# Patient Record
Sex: Male | Born: 1960 | Race: White | Hispanic: No | Marital: Married | Smoking: Former smoker
Health system: Southern US, Community
[De-identification: ages and names within clinical notes are randomized; demographics above are authoritative.]

## PROBLEM LIST (undated history)

## (undated) DIAGNOSIS — M1990 Unspecified osteoarthritis, unspecified site: Secondary | ICD-10-CM

## (undated) DIAGNOSIS — E785 Hyperlipidemia, unspecified: Secondary | ICD-10-CM

## (undated) DIAGNOSIS — Z87438 Personal history of other diseases of male genital organs: Secondary | ICD-10-CM

## (undated) DIAGNOSIS — I10 Essential (primary) hypertension: Secondary | ICD-10-CM

## (undated) DIAGNOSIS — E119 Type 2 diabetes mellitus without complications: Secondary | ICD-10-CM

## (undated) DIAGNOSIS — N529 Male erectile dysfunction, unspecified: Secondary | ICD-10-CM

## (undated) DIAGNOSIS — R972 Elevated prostate specific antigen [PSA]: Secondary | ICD-10-CM

## (undated) DIAGNOSIS — J189 Pneumonia, unspecified organism: Secondary | ICD-10-CM

## (undated) DIAGNOSIS — I509 Heart failure, unspecified: Secondary | ICD-10-CM

## (undated) DIAGNOSIS — F419 Anxiety disorder, unspecified: Secondary | ICD-10-CM

## (undated) DIAGNOSIS — C801 Malignant (primary) neoplasm, unspecified: Secondary | ICD-10-CM

## (undated) DIAGNOSIS — K219 Gastro-esophageal reflux disease without esophagitis: Secondary | ICD-10-CM

## (undated) DIAGNOSIS — R12 Heartburn: Secondary | ICD-10-CM

## (undated) DIAGNOSIS — C169 Malignant neoplasm of stomach, unspecified: Secondary | ICD-10-CM

## (undated) DIAGNOSIS — I639 Cerebral infarction, unspecified: Secondary | ICD-10-CM

## (undated) DIAGNOSIS — U071 COVID-19: Secondary | ICD-10-CM

## (undated) HISTORY — DX: Essential (primary) hypertension: I10

## (undated) HISTORY — DX: Cerebral infarction, unspecified: I63.9

## (undated) HISTORY — DX: Hyperlipidemia, unspecified: E78.5

## (undated) HISTORY — DX: Malignant neoplasm of stomach, unspecified: C16.9

## (undated) HISTORY — DX: Male erectile dysfunction, unspecified: N52.9

## (undated) HISTORY — DX: Heartburn: R12

## (undated) HISTORY — DX: COVID-19: U07.1

## (undated) HISTORY — DX: Anxiety disorder, unspecified: F41.9

## (undated) HISTORY — PX: MULTIPLE TOOTH EXTRACTIONS: SHX2053

## (undated) HISTORY — DX: Personal history of other diseases of male genital organs: Z87.438

## (undated) HISTORY — PX: TONSILLECTOMY: SUR1361

## (undated) HISTORY — DX: Pneumonia, unspecified organism: J18.9

## (undated) HISTORY — DX: Elevated prostate specific antigen (PSA): R97.20

---

## 1995-11-02 HISTORY — PX: CERVICAL SPINE SURGERY: SHX589

## 2003-11-02 HISTORY — PX: APPENDECTOMY: SHX54

## 2003-11-02 HISTORY — PX: CHOLECYSTECTOMY: SHX55

## 2004-02-10 ENCOUNTER — Encounter: Admission: RE | Admit: 2004-02-10 | Discharge: 2004-02-10 | Payer: Self-pay

## 2004-03-12 ENCOUNTER — Other Ambulatory Visit: Payer: Self-pay

## 2004-04-25 ENCOUNTER — Other Ambulatory Visit: Payer: Self-pay

## 2004-04-26 ENCOUNTER — Other Ambulatory Visit: Payer: Self-pay

## 2004-04-27 ENCOUNTER — Other Ambulatory Visit: Payer: Self-pay

## 2005-02-27 ENCOUNTER — Ambulatory Visit (HOSPITAL_COMMUNITY): Admission: RE | Admit: 2005-02-27 | Discharge: 2005-02-27 | Payer: Self-pay | Admitting: Orthopedic Surgery

## 2005-08-11 ENCOUNTER — Other Ambulatory Visit: Payer: Self-pay

## 2005-08-12 ENCOUNTER — Inpatient Hospital Stay: Payer: Self-pay | Admitting: Infectious Diseases

## 2006-02-05 ENCOUNTER — Emergency Department: Payer: Self-pay | Admitting: Emergency Medicine

## 2006-04-24 ENCOUNTER — Emergency Department: Payer: Self-pay | Admitting: General Practice

## 2006-05-18 ENCOUNTER — Emergency Department (HOSPITAL_COMMUNITY): Admission: EM | Admit: 2006-05-18 | Discharge: 2006-05-18 | Payer: Self-pay | Admitting: Emergency Medicine

## 2006-05-21 ENCOUNTER — Emergency Department (HOSPITAL_COMMUNITY): Admission: EM | Admit: 2006-05-21 | Discharge: 2006-05-21 | Payer: Self-pay | Admitting: Emergency Medicine

## 2007-01-31 ENCOUNTER — Emergency Department: Payer: Self-pay | Admitting: Emergency Medicine

## 2007-02-01 ENCOUNTER — Ambulatory Visit: Payer: Self-pay | Admitting: Emergency Medicine

## 2007-02-07 ENCOUNTER — Ambulatory Visit: Payer: Self-pay | Admitting: Family Medicine

## 2007-03-08 ENCOUNTER — Other Ambulatory Visit: Payer: Self-pay

## 2007-03-08 ENCOUNTER — Inpatient Hospital Stay: Payer: Self-pay | Admitting: Internal Medicine

## 2007-03-29 ENCOUNTER — Ambulatory Visit: Payer: Self-pay | Admitting: Gastroenterology

## 2007-05-22 ENCOUNTER — Emergency Department: Payer: Self-pay | Admitting: Emergency Medicine

## 2007-06-15 ENCOUNTER — Ambulatory Visit: Payer: Self-pay | Admitting: Gastroenterology

## 2007-06-20 ENCOUNTER — Ambulatory Visit: Payer: Self-pay | Admitting: Gastroenterology

## 2007-06-22 ENCOUNTER — Emergency Department: Payer: Self-pay | Admitting: Emergency Medicine

## 2008-09-16 ENCOUNTER — Emergency Department: Payer: Self-pay | Admitting: Emergency Medicine

## 2014-04-20 ENCOUNTER — Emergency Department: Payer: Self-pay | Admitting: Emergency Medicine

## 2014-04-20 LAB — CBC WITH DIFFERENTIAL/PLATELET
Basophil #: 0 10*3/uL (ref 0.0–0.1)
Basophil %: 0.4 %
Eosinophil #: 0.1 10*3/uL (ref 0.0–0.7)
Eosinophil %: 0.6 %
HCT: 43.8 % (ref 40.0–52.0)
HGB: 14.7 g/dL (ref 13.0–18.0)
Lymphocyte #: 1.8 10*3/uL (ref 1.0–3.6)
Lymphocyte %: 20.5 %
MCH: 29.3 pg (ref 26.0–34.0)
MCHC: 33.6 g/dL (ref 32.0–36.0)
MCV: 87 fL (ref 80–100)
Monocyte #: 0.6 x10 3/mm (ref 0.2–1.0)
Monocyte %: 6.6 %
Neutrophil #: 6.5 10*3/uL (ref 1.4–6.5)
Neutrophil %: 71.9 %
Platelet: 142 10*3/uL — ABNORMAL LOW (ref 150–440)
RBC: 5.02 10*6/uL (ref 4.40–5.90)
RDW: 13.6 % (ref 11.5–14.5)
WBC: 9 10*3/uL (ref 3.8–10.6)

## 2014-04-20 LAB — BASIC METABOLIC PANEL
Anion Gap: 4 — ABNORMAL LOW (ref 7–16)
BUN: 12 mg/dL (ref 7–18)
Calcium, Total: 8.9 mg/dL (ref 8.5–10.1)
Chloride: 100 mmol/L (ref 98–107)
Co2: 30 mmol/L (ref 21–32)
Creatinine: 0.98 mg/dL (ref 0.60–1.30)
EGFR (African American): >60
EGFR (Non-African Amer.): >60
Glucose: 121 mg/dL — ABNORMAL HIGH (ref 65–99)
Osmolality: 269 (ref 275–301)
Potassium: 4 mmol/L (ref 3.5–5.1)
Sodium: 134 mmol/L — ABNORMAL LOW (ref 136–145)

## 2016-08-26 ENCOUNTER — Encounter: Payer: Self-pay | Admitting: Urology

## 2016-08-26 ENCOUNTER — Ambulatory Visit: Payer: BLUE CROSS/BLUE SHIELD | Admitting: Urology

## 2016-08-26 VITALS — BP 198/127 | HR 75 | Ht 70.0 in | Wt 191.7 lb

## 2016-08-26 DIAGNOSIS — N529 Male erectile dysfunction, unspecified: Secondary | ICD-10-CM | POA: Diagnosis not present

## 2016-08-26 DIAGNOSIS — N138 Other obstructive and reflux uropathy: Secondary | ICD-10-CM

## 2016-08-26 DIAGNOSIS — N401 Enlarged prostate with lower urinary tract symptoms: Secondary | ICD-10-CM

## 2016-08-26 DIAGNOSIS — R972 Elevated prostate specific antigen [PSA]: Secondary | ICD-10-CM | POA: Diagnosis not present

## 2016-08-26 LAB — BLADDER SCAN AMB NON-IMAGING: Scan Result: 44

## 2016-08-26 NOTE — Progress Notes (Signed)
08/26/2016 4:13 PM   Kevin Esparza August 01, 1961 [id number]  Referring provider: Lum Babe, MD [place], [place] [address]  Chief Complaint  Patient presents with  . New Patient (Initial Visit)    elevated psa referred by [facility] in [place]    HPI: Patient is 55 year old Caucasian male who presents today as a referral from Robyne Askew, NP for elevated PSA of 4.7.  His IPSS score today is 19, which is moderate lower urinary tract symptomatology.  He is mostly dissatisfied with his quality life due to his urinary symptoms.  His PVR is 44 mL.     His major complaints today are urinary frequency, urgency, nocturia, urge incontinence, postvoid dribbling and intermittency.  Patient is an over the road truck driver.  He states he drinks 12-15 cans of [facility] on a daily basis.  He has had these symptoms for the last few years.  He denies any dysuria, hematuria or suprapubic pain.   He also denies any recent fevers, chills, nausea or vomiting.  He does not have a family history of PCa.      IPSS    Row Name 08/26/16 1500         International Prostate Symptom Score   How often have you had the sensation of not emptying your bladder? About half the time     How often have you had to urinate less than every two hours? Almost always     How often have you found you stopped and started again several times when you urinated? Less than half the time     How often have you found it difficult to postpone urination? More than half the time     How often have you had a weak urinary stream? Less than half the time     How often have you had to strain to start urination? Less than half the time     How many times did you typically get up at night to urinate? 1 Time     Total IPSS Score 19       Quality of Life due to urinary symptoms   If you were to spend the rest of your life with your urinary condition just the way it is now how would you  feel about that? Mostly Disatisfied        Score:  1-7 Mild 8-19 Moderate 20-35 Severe  Erectile dysfunction His SHIM score is 22, which is no erectile dysfunction.   He states that he filled out the questionnaire based on the last time he had a satisfactory sexual experience.   He admits that he is been having difficulty with erections for several years.   His major complaint is lack of firmness.  His libido is preserved.   His risk factors for ED are age, BPH, HTN, anxiety and blood pressure medications.   He denies any painful erections or curvatures with his erections.        SHIM    Row Name 08/26/16 1549         SHIM: Over the last 6 months:   How do you rate your confidence that you could get and keep an erection? Moderate     When you had erections with sexual stimulation, how often were your erections hard enough for penetration (entering your partner)? Almost Always or Always     During sexual intercourse, how often were you able to maintain your erection  after you had penetrated (entered) your partner? Not Difficult     During sexual intercourse, how difficult was it to maintain your erection to completion of intercourse? Not Difficult     When you attempted sexual intercourse, how often was it satisfactory for you? Slightly Difficult       SHIM Total Score   SHIM 22        Score: 1-7 Severe ED 8-11 Moderate ED 12-16 Mild-Moderate ED 17-21 Mild ED 22-25 No ED  PMH: Past Medical History:  Diagnosis Date  . Anxiety   . Elevated PSA   . Heartburn   . HLD (hyperlipidemia)   . HTN (hypertension)     Surgical History: Past Surgical History:  Procedure Laterality Date  . CERVICAL SPINE SURGERY  1997   c4-c7  . CHOLECYSTECTOMY  2005    Home Medications:    Medication List       Accurate as of 08/26/16  4:13 PM. Always use your most recent med list.          aspirin 81 MG chewable tablet Chew by mouth daily.   enalapril 5 MG tablet Commonly known  as:  VASOTEC Take 5 mg by mouth daily.   FISH OIL PO Take by mouth.   metoprolol succinate 50 MG 24 hr tablet Commonly known as:  TOPROL-XL Take 50 mg by mouth daily. Take with or immediately following a meal.   NIACIN PO Take by mouth.       Allergies: No Known Allergies  Family History: Family History  Problem Relation Age of Onset  . Kidney cancer Maternal Grandmother   . Kidney disease Neg Hx   . Prostate cancer Neg Hx     Social History:  reports that he quit smoking about 12 years ago. His smokeless tobacco use includes Chew. He reports that he drinks alcohol. He reports that he does not use drugs.  ROS: UROLOGY Frequent Urination?: Yes Hard to postpone urination?: Yes Burning/pain with urination?: No Get up at night to urinate?: Yes Leakage of urine?: Yes Urine stream starts and stops?: Yes Trouble starting stream?: No Do you have to strain to urinate?: No Blood in urine?: No Urinary tract infection?: No Sexually transmitted disease?: No Injury to kidneys or bladder?: No Painful intercourse?: No Weak stream?: No Erection problems?: Yes Penile pain?: No  Gastrointestinal Nausea?: No Vomiting?: No Indigestion/heartburn?: Yes Diarrhea?: Yes Constipation?: No  Constitutional Fever: No Night sweats?: No Weight loss?: No Fatigue?: Yes  Skin Skin rash/lesions?: No Itching?: No  Eyes Blurred vision?: No Double vision?: No  Ears/Nose/Throat Sore throat?: No Sinus problems?: Yes  Hematologic/Lymphatic Swollen glands?: No Easy bruising?: No  Cardiovascular Leg swelling?: No Chest pain?: No  Respiratory Cough?: No Shortness of breath?: No  Endocrine Excessive thirst?: Yes  Musculoskeletal Back pain?: Yes Joint pain?: Yes  Neurological Headaches?: No Dizziness?: Yes  Psychologic Depression?: No Anxiety?: Yes  Physical Exam: BP (!) 198/127   Pulse 75   Ht 5\' 10"  (1.778 m)   Wt 191 lb 11.2 oz (87 kg)   BMI 27.51 kg/m     Constitutional: Well nourished. Alert and oriented, No acute distress. HEENT: [place] AT, moist mucus membranes. Trachea midline, no masses. Cardiovascular: No clubbing, cyanosis, or edema. Respiratory: Normal respiratory effort, no increased work of breathing. GI: Abdomen is soft, non tender, non distended, no abdominal masses. Liver and spleen not palpable.  No hernias appreciated.  Stool sample for occult testing is not indicated.   GU: No CVA  tenderness.  No bladder fullness or masses.  Patient with circumcised phallus.  Urethral meatus is patent.  No penile discharge. No penile lesions or rashes. Scrotum without lesions, cysts, rashes and/or edema.  Testicles are located scrotally bilaterally. No masses are appreciated in the testicles. Left and right epididymis are normal. Rectal: Patient with  normal sphincter tone. Anus and perineum without scarring or rashes. No rectal masses are appreciated. Prostate is approximately 45 grams, no nodules are appreciated. Seminal vesicles are normal. Skin: No rashes, bruises or suspicious lesions. Lymph: No cervical or inguinal adenopathy. Neurologic: Grossly intact, no focal deficits, moving all 4 extremities. Psychiatric: Normal mood and affect.  Laboratory Data: Lab Results  Component Value Date   WBC 9.0 04/20/2014   HGB 14.7 04/20/2014   HCT 43.8 04/20/2014   MCV 87 04/20/2014   PLT 142 (L) 04/20/2014    Lab Results  Component Value Date   CREATININE 0.98 04/20/2014   PSA History  1.0 ng/mL on 01/16/2013   0.37  Free PSA    37%     1.3 ng/mL on 06/16/2014   0.5  Free PSA  38%   4.7 ng/mL on 08/05/2016   1.6  Free PSA  34%  Pertinent Imaging: Results for Kevin, Esparza (MRN [id number]) as of 09/03/2016 13:44  Ref. Range 08/26/2016 15:51  Scan Result Unknown 44    Assessment & Plan:    1. Elevated PSA  - I discussed with the patient that PSA is an acronym for  prostate specific antigen,  which is a protein made by the prostate  gland and can be detected in the blood stream. I explained to the patient situations that would increase the PSA, such as: a man's age,  BPH, infection, recent intercourse/ejaculation, prostate infarction, recent urethroscopic manipulation (Foley placement/cystoscopy) and prostate cancer.   - At this time, I have advised the patient that we will repeat the PSA to rule out lab error.  If that should return elevated, we could continue observation or pursue a prostate biopsy.   - We discussed that indications for prostate biopsy are defined by age and race specific PSA cutoffs as well as a PSA velocity of 0.75/year- his PSA has increased at a high velocity over the last two years    - PSA  2. BPH with LUTS  - IPSS score is 19/4  - Continue conservative management, avoiding bladder irritants and timed voiding's- advised patient to eliminate [facility] from his diet and consume water  - RTC pending PSA results  3. Erectile dysfunction  - SHIM score is 22  - Patient did not admit to me that he suffered from erectile dysfunction and tell we were finished with his office visit and in the hallway  - I stated to the patient that we can address this further at a later date pending his PSA results  Return for Pending PSA results.  These notes generated with voice recognition software. I apologize for typographical errors.  Zara Council, [facility] [address], [place], [place] [address] [phone]

## 2016-08-27 ENCOUNTER — Telehealth: Payer: Self-pay

## 2016-08-27 DIAGNOSIS — R972 Elevated prostate specific antigen [PSA]: Secondary | ICD-10-CM

## 2016-08-27 LAB — PSA: Prostate Specific Ag, Serum: 3.3 ng/mL (ref 0.0–4.0)

## 2016-08-27 NOTE — Telephone Encounter (Signed)
-----   Message from [name], PA-C sent at 08/27/2016  8:16 AM EDT ----- Please notify the patient that his PSA has come down.  I would like to make sure the downward trend continues, so if he could come back in 4 months that would be great.

## 2016-08-27 NOTE — Telephone Encounter (Signed)
Spoke with pt in reference to PSA results. Pt voiced understanding. Lab appt made and orders placed.  

## 2016-12-28 ENCOUNTER — Other Ambulatory Visit: Payer: BLUE CROSS/BLUE SHIELD

## 2016-12-28 ENCOUNTER — Other Ambulatory Visit: Payer: Self-pay

## 2016-12-28 DIAGNOSIS — R972 Elevated prostate specific antigen [PSA]: Secondary | ICD-10-CM

## 2016-12-29 ENCOUNTER — Telehealth: Payer: Self-pay

## 2016-12-29 DIAGNOSIS — R972 Elevated prostate specific antigen [PSA]: Secondary | ICD-10-CM

## 2016-12-29 LAB — PSA: Prostate Specific Ag, Serum: 2.8 ng/mL (ref 0.0–4.0)

## 2016-12-29 NOTE — Telephone Encounter (Signed)
Spoke with pt in reference to PSA results. Made pt aware will need an OV in August with labs prior. Pt voiced understanding. Pt was transferred to the front to make f/u appt. Orders placed.

## 2016-12-29 NOTE — Telephone Encounter (Signed)
-----   Message from [name], PA-C sent at 12/29/2016  8:14 AM EST ----- Patient's PSA continues to trend downward.  I would like to see him in August for PSA, I PSS and exam.

## 2017-06-28 ENCOUNTER — Other Ambulatory Visit: Payer: BLUE CROSS/BLUE SHIELD

## 2017-06-30 ENCOUNTER — Ambulatory Visit: Payer: BLUE CROSS/BLUE SHIELD | Admitting: Urology

## 2017-07-29 ENCOUNTER — Other Ambulatory Visit: Payer: Self-pay

## 2017-07-29 DIAGNOSIS — R972 Elevated prostate specific antigen [PSA]: Secondary | ICD-10-CM

## 2017-07-29 NOTE — Progress Notes (Signed)
08/01/2017 10:07 AM   [name] 1961-04-18 [id number]  Referring provider: No referring provider defined for this encounter.  Chief Complaint  Patient presents with  . Elevated PSA    1 year follow up  . Benign Prostatic Hypertrophy  . Erectile Dysfunction    HPI: Patient is 56 year old Caucasian male with a history of elevated PSA and BPH with LU TS who presents today for a 6 months follow up.  History of elevated PSA Patient was referred to [place] from Robyne Askew, NP for elevated PSA of 4.7.  Repeated PSA on 08/26/2016 was 3.3.  His current PSA returned at 1.9 on 07/29/2017.    BPH with LUTS His IPSS score today is 9, which is moderate lower urinary tract symptomatology.  He is pleased with his quality life due to his urinary symptoms.   His previous I PSS score was 19/4. His previous PVR was 44 mL.  His major complaints today are urinary frequency, urgency, nocturia, urge incontinence, postvoid dribbling and intermittency.  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  He states he drinks 12-15 cans of [facility] on a daily basis, but he has added water to his diet.  He has had these symptoms for the last few years.  He denies any dysuria, hematuria or suprapubic pain.  He also denies any recent fevers, chills, nausea or vomiting.  He does not have a family history of PCa.      IPSS    Row Name 08/01/17 0900         International Prostate Symptom Score   How often have you had the sensation of not emptying your bladder? Less than half the time     How often have you had to urinate less than every two hours? Less than 1 in 5 times     How often have you found you stopped and started again several times when you urinated? Less than 1 in 5 times     How often have you found it difficult to postpone urination? Less than half the time     How often have you had a weak urinary stream? Less than half the time     How often have you had to strain  to start urination? Not at All     How many times did you typically get up at night to urinate? 1 Time     Total IPSS Score 9       Quality of Life due to urinary symptoms   If you were to spend the rest of your life with your urinary condition just the way it is now how would you feel about that? Pleased  1        Score:  1-7 Mild 8-19 Moderate 20-35 Severe  Erectile dysfunction His SHIM score is 18, which is mild erectile dysfunction.   His previous SHIM score was 22.  He admits that he is been having difficulty with erections for several years.   His major complaint is lack of firmness.  His libido is preserved.   His risk factors for ED are age, BPH, HTN, anxiety and blood pressure medications.   He denies any painful erections or curvatures with his erections.        SHIM    Row Name 08/01/17 0950         SHIM: Over the last 6 months:   How do you rate your confidence  that you could get and keep an erection? Low     When you had erections with sexual stimulation, how often were your erections hard enough for penetration (entering your partner)? Most Times (much more than half the time)     During sexual intercourse, how often were you able to maintain your erection after you had penetrated (entered) your partner? Most Times (much more than half the time)     During sexual intercourse, how difficult was it to maintain your erection to completion of intercourse? Slightly Difficult     When you attempted sexual intercourse, how often was it satisfactory for you? Most Times (much more than half the time)       SHIM Total Score   SHIM 18        Score: 1-7 Severe ED 8-11 Moderate ED 12-16 Mild-Moderate ED 17-21 Mild ED 22-25 No ED  PMH: Past Medical History:  Diagnosis Date  . Anxiety   . ED (erectile dysfunction)   . Elevated PSA   . Heartburn   . History of BPH   . HLD (hyperlipidemia)   . HTN (hypertension)     Surgical History: Past Surgical History:    Procedure Laterality Date  . CERVICAL SPINE SURGERY  1997   c4-c7  . CHOLECYSTECTOMY  2005  . TONSILLECTOMY      Home Medications:  Allergies as of 08/01/2017   No Known Allergies     Medication List       Accurate as of 08/01/17 10:07 AM. Always use your most recent med list.          amLODipine 10 MG tablet Commonly known as:  NORVASC Take 10 mg by mouth daily.   aspirin 81 MG chewable tablet Chew by mouth daily.   enalapril 5 MG tablet Commonly known as:  VASOTEC Take 5 mg by mouth daily.   FISH OIL PO Take by mouth.   metoprolol succinate 50 MG 24 hr tablet Commonly known as:  TOPROL-XL Take 50 mg by mouth daily. Take with or immediately following a meal.   NIACIN PO Take by mouth.   sildenafil 20 MG tablet Commonly known as:  REVATIO Take 3 to 5 tablets two hours before intercouse on an empty stomach.  Do not take with nitrates.            Discharge Care Instructions        Start     Ordered   08/01/17 0000  Testosterone     08/01/17 1004   08/01/17 0000  TSH     08/01/17 1004   08/01/17 0000  sildenafil (REVATIO) 20 MG tablet    Question:  Supervising Provider  Answer:  Hollice Espy   08/01/17 1005      Allergies: No Known Allergies  Family History: Family History  Problem Relation Age of Onset  . Kidney cancer Maternal Grandmother   . Kidney disease Neg Hx   . Prostate cancer Neg Hx   . Bladder Cancer Neg Hx     Social History:  reports that he quit smoking about 13 years ago. His smokeless tobacco use includes Chew. He reports that he drinks alcohol. He reports that he does not use drugs.  ROS: UROLOGY Frequent Urination?: No Hard to postpone urination?: No Burning/pain with urination?: No Get up at night to urinate?: No Leakage of urine?: No Urine stream starts and stops?: No Trouble starting stream?: No Do you have to strain to urinate?: No Blood in urine?:  No Urinary tract infection?: No Sexually transmitted  disease?: No Injury to kidneys or bladder?: No Painful intercourse?: No Weak stream?: No Erection problems?: No Penile pain?: No  Gastrointestinal Nausea?: No Vomiting?: No Indigestion/heartburn?: No Diarrhea?: No Constipation?: No  Constitutional Fever: No Night sweats?: No Weight loss?: No Fatigue?: No  Skin Skin rash/lesions?: No Itching?: No  Eyes Blurred vision?: No Double vision?: No  Ears/Nose/Throat Sore throat?: No Sinus problems?: No  Hematologic/Lymphatic Swollen glands?: No Easy bruising?: No  Cardiovascular Leg swelling?: No Chest pain?: No  Respiratory Cough?: No Shortness of breath?: No  Endocrine Excessive thirst?: No  Musculoskeletal Back pain?: No Joint pain?: No  Neurological Headaches?: No Dizziness?: No  Psychologic Depression?: No Anxiety?: No  Physical Exam: BP (!) 165/80   Pulse 73   Ht 5\' 10"  (1.778 m)   Wt 182 lb 9.6 oz (82.8 kg)   BMI 26.20 kg/m   Constitutional: Well nourished. Alert and oriented, No acute distress. HEENT: [place] AT, moist mucus membranes. Trachea midline, no masses. Cardiovascular: No clubbing, cyanosis, or edema. Respiratory: Normal respiratory effort, no increased work of breathing. GI: Abdomen is soft, non tender, non distended, no abdominal masses. Liver and spleen not palpable.  No hernias appreciated.  Stool sample for occult testing is not indicated.   GU: No CVA tenderness.  No bladder fullness or masses.  Patient with circumcised phallus.  Urethral meatus is patent.  No penile discharge. No penile lesions or rashes. Scrotum without lesions, cysts, rashes and/or edema.  Testicles are located scrotally bilaterally. No masses are appreciated in the testicles. Left and right epididymis are normal. Rectal: Patient with  normal sphincter tone. Anus and perineum without scarring or rashes. No rectal masses are appreciated. Prostate is approximately 45 grams, no nodules are appreciated. Seminal  vesicles are normal. Skin: No rashes, bruises or suspicious lesions. Lymph: No cervical or inguinal adenopathy. Neurologic: Grossly intact, no focal deficits, moving all 4 extremities. Psychiatric: Normal mood and affect.  Laboratory Data: PSA History  1.0 ng/mL on 01/16/2013   0.37  Free PSA    37%     1.3 ng/mL on 06/16/2014   0.5  Free PSA  38%   4.7 ng/mL on 08/05/2016   1.6  Free PSA  34%   3.3 ng/mL on 08/26/2016  2.8 ng/mL on 12/28/2016  I have reviewed the labs.  Assessment & Plan:    1. History of elevated PSA  - most recent PSA was 1.9  - RTC in 6 months for PSA  2. BPH with LUTS  - IPSS score is 9/1, it is improving  - Continue conservative management, avoiding bladder irritants and timed voiding's- advised patient to eliminate [facility] from his diet and consume water  - RTC 12 months for I PSS, PSA and exam  3. Erectile dysfunction  - SHIM score is 18, it is worsening  - I explained to the patient that in order to achieve an erection it takes good functioning of the nervous system (parasympathetic and rs, sympathetic, sensory and motor), good blood flow into the erectile tissue of the penis and a desire to have sex  - I explained that conditions like diabetes, hypertension, coronary artery disease, peripheral vascular disease, smoking, alcohol consumption, age, sleep apnea and BPH can diminish the ability to have an erection  - we will obtain a serum testosterone level and TSH at this time; if it is abnormal we will need to repeat the study for confirmation  - A recent  study published in Sex Med 2018 Apr 13 revealed moderate to vigorous aerobic exercise for 40 minutes 4 times per week can decrease erectile problems caused by physical inactivity, obesity, hypertension, metabolic syndrome and/or cardiovascular diseases  - We discussed trying a PDE5 inhibitor - Sildenafil 20 mg, 3 to 5 tablets two hours prior to intercourse on an empty stomach, # 35; he is warned  not to take medications that contain nitrates.  I also advised him of the side effects, such as: headache, flushing, dyspepsia, abnormal vision, nasal congestion, back pain, myalgia, nausea, dizziness, and rash.  - RTC in one month for SHIM score  Return in about 1 month (around 09/01/2017) for SHIM score.  These notes generated with voice recognition software. I apologize for typographical errors.  Zara Council, [facility] [address] [place], [place] [address] [phone]

## 2017-07-30 LAB — PSA: Prostate Specific Ag, Serum: 1.9 ng/mL (ref 0.0–4.0)

## 2017-08-01 ENCOUNTER — Encounter: Payer: Self-pay | Admitting: Urology

## 2017-08-01 ENCOUNTER — Ambulatory Visit (INDEPENDENT_AMBULATORY_CARE_PROVIDER_SITE_OTHER): Payer: 59 | Admitting: Urology

## 2017-08-01 VITALS — BP 165/80 | HR 73 | Ht 70.0 in | Wt 182.6 lb

## 2017-08-01 DIAGNOSIS — N138 Other obstructive and reflux uropathy: Secondary | ICD-10-CM | POA: Diagnosis not present

## 2017-08-01 DIAGNOSIS — N401 Enlarged prostate with lower urinary tract symptoms: Secondary | ICD-10-CM | POA: Diagnosis not present

## 2017-08-01 DIAGNOSIS — Z87898 Personal history of other specified conditions: Secondary | ICD-10-CM | POA: Diagnosis not present

## 2017-08-01 DIAGNOSIS — N529 Male erectile dysfunction, unspecified: Secondary | ICD-10-CM

## 2017-08-01 MED ORDER — SILDENAFIL CITRATE 20 MG PO TABS: ORAL_TABLET | ORAL | 3 refills | Status: DC

## 2017-08-03 ENCOUNTER — Telehealth: Payer: Self-pay

## 2017-08-03 LAB — TSH: TSH: 2.16 u[IU]/mL (ref 0.450–4.500)

## 2017-08-03 LAB — TESTOSTERONE: Testosterone: 364 ng/dL (ref 264–916)

## 2017-08-03 NOTE — Telephone Encounter (Signed)
Called patient. No answer. No DPR on file.

## 2017-08-03 NOTE — Telephone Encounter (Signed)
-----   Message from [name], PA-C sent at 08/03/2017  8:25 AM EDT ----- Please let Kevin Esparza known that his testosterone and thyroid blood work are normal.

## 2017-08-03 NOTE — Telephone Encounter (Signed)
-----   Message from [name], PA-C sent at 08/03/2017  8:25 AM EDT ----- Please let Mr. Boulet known that his testosterone and thyroid blood work are normal.

## 2017-08-03 NOTE — Telephone Encounter (Signed)
Spoke with pt in reference to lab results. Pt voiced understanding.  

## 2017-08-04 NOTE — Telephone Encounter (Signed)
Spoke to patient. Gave results. Patient verbalized understanding. 

## 2017-09-02 NOTE — Progress Notes (Signed)
09/05/2017 10:17 AM   Kevin Esparza [birth year] [id number]  Referring provider: No referring provider defined for this encounter.  Chief Complaint  Patient presents with  . Erectile Dysfunction  . Elevated PSA  . Benign Prostatic Hypertrophy    HPI: Patient is 56 year old Caucasian male with a history of elevated PSA, BPH with LU TS and ED who presents today for a one month follow up after a trial of sildenafil.    Erectile dysfunction His SHIM score is 18, which is mild erectile dysfunction.   His previous SHIM score was 18.  He admits that he is been having difficulty with erections for several years.   His major complaint is lack of firmness.  His libido is preserved.   His risk factors for ED are age, BPH, HTN, anxiety, night shift and blood pressure medications.   He denies any painful erections or curvatures with his erections.   His testosterone level is 364 ng/dL.  TSH is normal at 2.160.   SHIM    Row Name 08/01/17 0950 09/05/17 0936       SHIM: Over the last 6 months:   How do you rate your confidence that you could get and keep an erection?  Low  Moderate    When you had erections with sexual stimulation, how often were your erections hard enough for penetration (entering your partner)?  Most Times (much more than half the time)  Sometimes (about half the time)    During sexual intercourse, how often were you able to maintain your erection after you had penetrated (entered) your partner?  Most Times (much more than half the time)  Most Times (much more than half the time)    During sexual intercourse, how difficult was it to maintain your erection to completion of intercourse?  Slightly Difficult  Slightly Difficult    When you attempted sexual intercourse, how often was it satisfactory for you?  Most Times (much more than half the time)  Most Times (much more than half the time)      SHIM Total Score   SHIM  18  18       Score: 1-7 Severe ED 8-11 Moderate  ED 12-16 Mild-Moderate ED 17-21 Mild ED 22-25 No ED  Weight loss due to new job which requires a lot of walking.    PMH: Past Medical History:  Diagnosis Date  . Anxiety   . ED (erectile dysfunction)   . Elevated PSA   . Heartburn   . History of BPH   . HLD (hyperlipidemia)   . HTN (hypertension)     Surgical History: Past Surgical History:  Procedure Laterality Date  . APPENDECTOMY  2005  . CERVICAL SPINE SURGERY  1997   c4-c7  . CHOLECYSTECTOMY  2005  . TONSILLECTOMY      Home Medications:  Allergies as of 09/05/2017   No Known Allergies     Medication List        Accurate as of 09/05/17 10:17 AM. Always use your most recent med list.          amLODipine 10 MG tablet Commonly known as:  NORVASC Take 10 mg by mouth daily.   aspirin 81 MG chewable tablet Chew by mouth daily.   enalapril 5 MG tablet Commonly known as:  VASOTEC Take 5 mg by mouth daily.   metoprolol succinate 50 MG 24 hr tablet Commonly known as:  TOPROL-XL Take 50 mg by mouth daily. Take with or  immediately following a meal.   sildenafil 20 MG tablet Commonly known as:  REVATIO Take 3 to 5 tablets two hours before intercouse on an empty stomach.  Do not take with nitrates.       Allergies: No Known Allergies  Family History: Family History  Problem Relation Age of Onset  . Kidney cancer Maternal Grandmother   . Stroke Father   . Hypertension Mother   . Diabetes Mother   . Osteoarthritis Mother   . Osteoporosis Mother   . Kidney disease Neg Hx   . Prostate cancer Neg Hx   . Bladder Cancer Neg Hx     Social History:  reports that he quit smoking about 13 years ago. His smokeless tobacco use includes chew. He reports that he does not drink alcohol or use drugs.  ROS: UROLOGY Frequent Urination?: No Hard to postpone urination?: No Burning/pain with urination?: No Get up at night to urinate?: No Leakage of urine?: No Urine stream starts and stops?: No Trouble starting  stream?: No Do you have to strain to urinate?: No Blood in urine?: No Urinary tract infection?: No Sexually transmitted disease?: No Injury to kidneys or bladder?: No Painful intercourse?: No Weak stream?: No Erection problems?: No Penile pain?: No  Gastrointestinal Nausea?: No Vomiting?: No Indigestion/heartburn?: No Diarrhea?: No Constipation?: No  Constitutional Fever: No Night sweats?: No Weight loss?: No Fatigue?: No  Skin Skin rash/lesions?: No Itching?: No  Eyes Blurred vision?: No Double vision?: No  Ears/Nose/Throat Sore throat?: No Sinus problems?: No  Hematologic/Lymphatic Swollen glands?: No Easy bruising?: No  Cardiovascular Leg swelling?: No Chest pain?: No  Respiratory Cough?: No Shortness of breath?: No  Endocrine Excessive thirst?: No  Musculoskeletal Back pain?: No Joint pain?: No  Neurological Headaches?: No Dizziness?: No  Psychologic Depression?: No Anxiety?: No  Physical Exam: BP (!) 148/82   Pulse 61   Temp 98.4 F (36.9 C) (Oral)   Ht 5\' 10"  (1.778 m)   Wt 171 lb 11.2 oz (77.9 kg)   BMI 24.64 kg/m   Constitutional: Well nourished. Alert and oriented, No acute distress. HEENT: [place] AT, moist mucus membranes. Trachea midline, no masses. Cardiovascular: No clubbing, cyanosis, or edema. Respiratory: Normal respiratory effort, no increased work of breathing. Skin: No rashes, bruises or suspicious lesions. Lymph: No cervical or inguinal adenopathy. Neurologic: Grossly intact, no focal deficits, moving all 4 extremities. Psychiatric: Normal mood and affect.  Laboratory Data: PSA History  1.0 ng/mL on 01/16/2013   0.37  Free PSA    37%     1.3 ng/mL on 06/16/2014   0.5  Free PSA  38%   4.7 ng/mL on 08/05/2016   1.6  Free PSA  34%   3.3 ng/mL on 08/26/2016  2.8 ng/mL on 12/28/2016  1.9 ng/mL on 07/29/2017  I have reviewed the labs.  Assessment & Plan:    1. History of elevated PSA  - most recent PSA  was 1.9  - RTC in 6 months for PSA (12/2017)  2. BPH with LUTS  - IPSS score is 9/1, it is improving  - Continue conservative management, avoiding bladder irritants and timed voiding's- advised patient to eliminate [facility] from his diet and consume water  - RTC 12 months for I PSS, PSA and exam (08/2018)  3. Erectile dysfunction  - SHIM score is 18, it is worsening  - I explained to the patient that in order to achieve an erection it takes good functioning of the nervous system (parasympathetic  and rs, sympathetic, sensory and motor), good blood flow into the erectile tissue of the penis and a desire to have sex  - I explained that conditions like diabetes, hypertension, coronary artery disease, peripheral vascular disease, smoking, alcohol consumption, age, sleep apnea and BPH can diminish the ability to have an erection  - we will obtain a serum testosterone level and TSH at this time; if it is abnormal we will need to repeat the study for confirmation  - A recent study published in Sex Med 2018 Apr 13 revealed moderate to vigorous aerobic exercise for 40 minutes 4 times per week can decrease erectile problems caused by physical inactivity, obesity, hypertension, metabolic syndrome and/or cardiovascular diseases  - We discussed trying a PDE5 inhibitor - Sildenafil 20 mg, 3 to 5 tablets two hours prior to intercourse on an empty stomach, # 35; he is warned not to take medications that contain nitrates.  I also advised him of the side effects, such as: headache, flushing, dyspepsia, abnormal vision, nasal congestion, back pain, myalgia, nausea, dizziness, and rash.  - RTC in one month for SHIM score - didn't fill script for sildenafil due to time constraits  Return for March 2019 for PSA only and yearly visit in 08/2018.  These notes generated with voice recognition software. I apologize for typographical errors.  Zara Council, [facility] [address], [place], [place] [address] [phone]

## 2017-09-05 ENCOUNTER — Ambulatory Visit (INDEPENDENT_AMBULATORY_CARE_PROVIDER_SITE_OTHER): Payer: 59 | Admitting: Urology

## 2017-09-05 ENCOUNTER — Encounter: Payer: Self-pay | Admitting: Urology

## 2017-09-05 VITALS — BP 148/82 | HR 61 | Temp 98.4°F | Ht 70.0 in | Wt 171.7 lb

## 2017-09-05 DIAGNOSIS — N529 Male erectile dysfunction, unspecified: Secondary | ICD-10-CM | POA: Diagnosis not present

## 2017-11-24 ENCOUNTER — Encounter: Payer: Self-pay | Admitting: Emergency Medicine

## 2017-11-24 ENCOUNTER — Emergency Department
Admission: EM | Admit: 2017-11-24 | Discharge: 2017-11-24 | Disposition: A | Discharge status: Home / Self Care | Payer: Worker's Compensation | Attending: Emergency Medicine | Admitting: Emergency Medicine

## 2017-11-24 ENCOUNTER — Emergency Department: Payer: Worker's Compensation

## 2017-11-24 ENCOUNTER — Other Ambulatory Visit: Payer: Self-pay

## 2017-11-24 DIAGNOSIS — I10 Essential (primary) hypertension: Secondary | ICD-10-CM | POA: Insufficient documentation

## 2017-11-24 DIAGNOSIS — Y929 Unspecified place or not applicable: Secondary | ICD-10-CM | POA: Diagnosis not present

## 2017-11-24 DIAGNOSIS — W228XXA Striking against or struck by other objects, initial encounter: Secondary | ICD-10-CM | POA: Insufficient documentation

## 2017-11-24 DIAGNOSIS — Y939 Activity, unspecified: Secondary | ICD-10-CM | POA: Insufficient documentation

## 2017-11-24 DIAGNOSIS — F17220 Nicotine dependence, chewing tobacco, uncomplicated: Secondary | ICD-10-CM | POA: Diagnosis not present

## 2017-11-24 DIAGNOSIS — S60221A Contusion of right hand, initial encounter: Secondary | ICD-10-CM | POA: Insufficient documentation

## 2017-11-24 DIAGNOSIS — Z7982 Long term (current) use of aspirin: Secondary | ICD-10-CM | POA: Diagnosis not present

## 2017-11-24 DIAGNOSIS — Z79899 Other long term (current) drug therapy: Secondary | ICD-10-CM | POA: Insufficient documentation

## 2017-11-24 DIAGNOSIS — Y999 Unspecified external cause status: Secondary | ICD-10-CM | POA: Insufficient documentation

## 2017-11-24 DIAGNOSIS — S6991XA Unspecified injury of right wrist, hand and finger(s), initial encounter: Secondary | ICD-10-CM | POA: Diagnosis present

## 2017-11-24 MED ORDER — MELOXICAM 15 MG PO TABS: 15.0000 mg | ORAL_TABLET | Freq: Every day | ORAL | 2 refills | Status: DC

## 2017-11-24 NOTE — ED Notes (Signed)
See triage note  States he presents with pain and swelling to right ring finger  States he hit it with metal bar

## 2017-11-24 NOTE — ED Provider Notes (Signed)
[facility] Emergency Department Provider Note  ____________________________________________   First MD Initiated Contact with Patient 11/24/17 [phone]     (approximate)  I have reviewed the triage vital signs and the nursing notes.   HISTORY  Chief Complaint Hand Pain    HPI Kevin Esparza is a 57 y.o. male he states he was at work in a metal bar hit his hand.  He was able to finish unloading the boxes.  But states that hand is become more swollen and painful.  He denies any other injuries.  He denies any loss of motion of the hand.  Past Medical History:  Diagnosis Date  . Anxiety   . ED (erectile dysfunction)   . Elevated PSA   . Heartburn   . History of BPH   . HLD (hyperlipidemia)   . HTN (hypertension)     There are no active problems to display for this patient.   Past Surgical History:  Procedure Laterality Date  . APPENDECTOMY  2005  . CERVICAL SPINE SURGERY  1997   c4-c7  . CHOLECYSTECTOMY  2005  . TONSILLECTOMY      Prior to Admission medications   Medication Sig Start Date End Date Taking? Authorizing Provider  amLODipine (NORVASC) 10 MG tablet Take 10 mg by mouth daily.    [provider]  aspirin 81 MG chewable tablet Chew by mouth daily.    [provider]  enalapril (VASOTEC) 5 MG tablet Take 5 mg by mouth daily.    [provider]  meloxicam (MOBIC) 15 MG tablet Take 1 tablet (15 mg total) by mouth daily. 11/24/17 11/24/18  Jamisha Hoeschen, Linden Dolin, PA-C  metoprolol succinate (TOPROL-XL) 50 MG 24 hr tablet Take 50 mg by mouth daily. Take with or immediately following a meal.    [provider]    Allergies Patient has no known allergies.  Family History  Problem Relation Age of Onset  . Kidney cancer Maternal Grandmother   . Stroke Father   . Hypertension Mother   . Diabetes Mother   . Osteoarthritis Mother   . Osteoporosis Mother   . Kidney disease Neg Hx   . Prostate cancer Neg Hx   .  Bladder Cancer Neg Hx     Social History Social History   Tobacco Use  . Smoking status: Former Smoker    Last attempt to quit: 03/05/2004    Years since quitting: 13.7  . Smokeless tobacco: Current User    Types: Chew  Substance Use Topics  . Alcohol use: No    Frequency: Never  . Drug use: No    Review of Systems  Constitutional: No fever/chills Eyes: No visual changes. ENT: No sore throat. Respiratory: Denies cough Genitourinary: Negative for dysuria. Musculoskeletal: Negative for back pain.  Positive for right hand pain Skin: Negative for rash.    ____________________________________________   PHYSICAL EXAM:  VITAL SIGNS: ED Triage Vitals  Enc Vitals Group     BP 11/24/17 0829 (!) 169/84     Pulse Rate 11/24/17 0829 79     Resp 11/24/17 0829 20     Temp 11/24/17 0829 97.8 F (36.6 C)     Temp Source 11/24/17 0829 Oral     SpO2 11/24/17 0829 98 %     Weight 11/24/17 0830 171 lb (77.6 kg)     Height --      Head Circumference --      Peak Flow --  Pain Score 11/24/17 0829 9     Pain Loc --      Pain Edu? --      Excl. in [place]? --     Constitutional: Alert and oriented. Well appearing and in no acute distress. Eyes: Conjunctivae are normal.  Head: Atraumatic. Nose: No congestion/rhinnorhea. Mouth/Throat: Mucous membranes are moist.   Cardiovascular: Normal rate, regular rhythm. Respiratory: Normal respiratory effort.  No retractions GU: deferred Musculoskeletal: FROM all extremities, warm and well perfused.  Right hand is swollen and tender.  Mostly along the index finger and metacarpal.  Neurovascular is intact Neurologic:  Normal speech and language.  Skin:  Skin is warm, dry and intact. No rash noted. Psychiatric: Mood and affect are normal. Speech and behavior are normal.  ____________________________________________   LABS (all labs ordered are listed, but only abnormal results are displayed)  Labs Reviewed - No data to  display ____________________________________________   ____________________________________________  RADIOLOGY  X-ray of the right hand is negative  ____________________________________________   PROCEDURES  Procedure(s) performed:   .Splint Application Date/Time: 12/15/863 10:46 AM Performed by: Doreene Eland, NT Authorized by: Versie Starks, PA-C   Consent:    Consent obtained:  Verbal   Consent given by:  Patient   Risks discussed:  Discoloration, numbness, pain and swelling   Alternatives discussed:  No treatment Pre-procedure details:    Sensation:  Normal Procedure details:    Laterality:  Right   Location:  Finger   Finger:  R index finger   Strapping: no     Cast type:  Finger   Supplies:  Prefabricated splint Post-procedure details:    Pain:  Unchanged   Sensation:  Normal   Patient tolerance of procedure:  Tolerated well, no immediate complications      ____________________________________________   INITIAL IMPRESSION / ASSESSMENT AND PLAN / ED COURSE  Pertinent labs & imaging results that were available during my care of the patient were reviewed by me and considered in my medical decision making (see chart for details).  Patient is a 57 year old male complaining of right hand pain after a metal bar hit his hand.  Patient was at work.  His company was called and [employer] was verified.  On physical exam the right hand is swollen and tender along the right index finger and metacarpal.  The wrist is not tender.  X-ray of the right hand is negative for fracture.  Finger splint was applied by the tech.  Patient was instructed to not use the right hand for 3 days at work.  He was given a prescription for meloxicam 15 mg/day pain and swelling.  He is to elevate and ice the hand as much as possible.  He is to follow-up with either [facility] acute care or a orthopedic of workers comp choice.  Patient states he understands and will comply with  our instructions.  He was given discharge instructions which included Worker's Comp. papers.  He was discharged in stable condition     As part of my medical decision making, I reviewed the following data within the [place] notes reviewed and incorporated, Radiograph reviewed , Notes from prior ED visits and [place] Controlled Substance Database  ____________________________________________   FINAL CLINICAL IMPRESSION(S) / ED DIAGNOSES  Final diagnoses:  Contusion of right hand, initial encounter      NEW MEDICATIONS STARTED DURING THIS VISIT:  New Prescriptions   MELOXICAM (MOBIC) 15 MG TABLET    Take 1  tablet (15 mg total) by mouth daily.     Note:  This document was prepared using Dragon voice recognition software and may include unintentional dictation errors.    Versie Starks, PA-C 11/24/17 1049    Delman Kitten, MD 11/24/17 [phone]

## 2017-11-24 NOTE — ED Triage Notes (Signed)
Pt with right hand pain and wrist pain. States a metal bar fell on his hand this am.

## 2017-11-24 NOTE — Discharge Instructions (Signed)
Follow-up with the acute care or a doctor of your workers comp choice.  If the swelling has not decreased in 3-5 days she will definitely need to have a follow-up appointment.  Elevate and ice.  Use medication as needed for pain and inflammation

## 2017-11-28 ENCOUNTER — Encounter: Payer: Self-pay | Admitting: *Deleted

## 2017-11-29 ENCOUNTER — Encounter: Payer: Self-pay | Admitting: *Deleted

## 2017-12-06 ENCOUNTER — Encounter: Payer: Self-pay | Admitting: General Surgery

## 2017-12-06 ENCOUNTER — Ambulatory Visit (INDEPENDENT_AMBULATORY_CARE_PROVIDER_SITE_OTHER): Payer: BLUE CROSS/BLUE SHIELD | Admitting: General Surgery

## 2017-12-06 VITALS — BP 150/72 | HR 83 | Resp 14 | Ht 70.0 in | Wt 175.0 lb

## 2017-12-06 DIAGNOSIS — L723 Sebaceous cyst: Secondary | ICD-10-CM | POA: Diagnosis not present

## 2017-12-06 NOTE — Patient Instructions (Signed)
The patient is aware to call back for any questions or concerns.  

## 2017-12-06 NOTE — Progress Notes (Signed)
Patient ID: Kevin Esparza, male   DOB: Nov 25, 1960, 57 y.o.   MRN: [id number]  Chief Complaint  Patient presents with  . Lipoma    HPI Kevin Esparza is a 57 y.o. male.  Here for evaluation of a possible left posterior thigh lipoma referred by Ranae Plumber PA.He states it has been on the for at least 5 years. He states it has gotten larger over the past 6 months and causing some pain with sitting.  He is a Administrator for [employer]  HPI  Past Medical History:  Diagnosis Date  . Anxiety   . ED (erectile dysfunction)   . Elevated PSA   . Heartburn   . History of BPH   . HLD (hyperlipidemia)   . HTN (hypertension)   . Pneumonia     Past Surgical History:  Procedure Laterality Date  . APPENDECTOMY  2005  . CERVICAL SPINE SURGERY  1997   c4-c7  . CHOLECYSTECTOMY  2005  . TONSILLECTOMY      Family History  Problem Relation Age of Onset  . Kidney cancer Maternal Grandmother   . Stroke Father   . Hypertension Mother   . Diabetes Mother   . Osteoarthritis Mother   . Osteoporosis Mother   . Kidney disease Neg Hx   . Prostate cancer Neg Hx   . Bladder Cancer Neg Hx     Social History Social History   Tobacco Use  . Smoking status: Former Smoker    Years: 14.00    Types: Cigarettes    Last attempt to quit: 03/05/2004    Years since quitting: 13.7  . Smokeless tobacco: Current User    Types: Chew  Substance Use Topics  . Alcohol use: Yes    Frequency: Never    Comment: occaionally  . Drug use: No    No Known Allergies  Current Outpatient Medications  Medication Sig Dispense Refill  . amLODipine (NORVASC) 10 MG tablet Take 10 mg by mouth daily.    Marland Kitchen aspirin 81 MG chewable tablet Chew by mouth daily.    . enalapril (VASOTEC) 5 MG tablet Take 5 mg by mouth daily.    . metoprolol succinate (TOPROL-XL) 50 MG 24 hr tablet Take 50 mg by mouth daily. Take with or immediately following a meal.     No current facility-administered medications for this visit.      Review of Systems Review of Systems  Constitutional: Negative.   Respiratory: Negative.   Cardiovascular: Negative.     Blood pressure (!) 150/72, pulse 83, resp. rate 14, height 5\' 10"  (1.778 m), weight 175 lb (79.4 kg).  Physical Exam Physical Exam  Constitutional: He is oriented to person, place, and time. He appears well-developed and well-nourished.  HENT:  Mouth/Throat: Oropharynx is clear and moist. No oropharyngeal exudate.  Eyes: Conjunctivae are normal. No scleral icterus.  Neck: Neck supple.  Cardiovascular: Normal rate, regular rhythm and normal heart sounds.  Pulses:      Femoral pulses are 2+ on the right side, and 2+ on the left side. Pulmonary/Chest: Effort normal and breath sounds normal.  Abdominal: Soft. Normal appearance. There is no tenderness.  Musculoskeletal:       Legs: Lymphadenopathy:    He has no cervical adenopathy.       Right: No inguinal adenopathy present.       Left: No inguinal adenopathy present.  Neurological: He is alert and oriented to person, place, and time.  Skin: Skin is warm and dry.  3.5 x 5 cm sebaceous cyst proximal posterior left thigh  Psychiatric: His behavior is normal.       Assessment    Symptomatic sebaceous cyst in the posterior left thigh.    Plan    Office excision of sebaceous cyst left posterior thigh 4 days off work     HPI, Physical Exam, Assessment and Plan have been scribed under the direction and in the presence of Robert Bellow, MD. Karie Fetch, RN  I have completed the exam and reviewed the above documentation for accuracy and completeness.  I agree with the above.  Haematologist has been used and any errors in dictation or transcription are unintentional.  Hervey Ard, M.D., F.A.C.S.  Kevin Esparza 12/06/2017, 7:07 PM

## 2017-12-29 ENCOUNTER — Encounter: Payer: Self-pay | Admitting: General Surgery

## 2017-12-29 ENCOUNTER — Ambulatory Visit (INDEPENDENT_AMBULATORY_CARE_PROVIDER_SITE_OTHER): Payer: BLUE CROSS/BLUE SHIELD | Admitting: General Surgery

## 2017-12-29 VITALS — BP 136/80 | HR 82 | Resp 12 | Ht 68.0 in | Wt 175.0 lb

## 2017-12-29 DIAGNOSIS — L723 Sebaceous cyst: Secondary | ICD-10-CM

## 2017-12-29 DIAGNOSIS — D1723 Benign lipomatous neoplasm of skin and subcutaneous tissue of right leg: Secondary | ICD-10-CM

## 2017-12-29 NOTE — Progress Notes (Signed)
Patient ID: Kevin Esparza, male   DOB: Apr 15, 1961, 57 y.o.   MRN: [id number]  Chief Complaint  Patient presents with  . Procedure    HPI Kevin Esparza is a 57 y.o. male here today for a excision left thigh sebaceous cyst.  HPI  Past Medical History:  Diagnosis Date  . Anxiety   . ED (erectile dysfunction)   . Elevated PSA   . Heartburn   . History of BPH   . HLD (hyperlipidemia)   . HTN (hypertension)   . Pneumonia     Past Surgical History:  Procedure Laterality Date  . APPENDECTOMY  2005  . CERVICAL SPINE SURGERY  1997   c4-c7  . CHOLECYSTECTOMY  2005  . TONSILLECTOMY      Family History  Problem Relation Age of Onset  . Kidney cancer Maternal Grandmother   . Stroke Father   . Hypertension Mother   . Diabetes Mother   . Osteoarthritis Mother   . Osteoporosis Mother   . Kidney disease Neg Hx   . Prostate cancer Neg Hx   . Bladder Cancer Neg Hx     Social History Social History   Tobacco Use  . Smoking status: Former Smoker    Years: 14.00    Types: Cigarettes    Last attempt to quit: 03/05/2004    Years since quitting: 13.8  . Smokeless tobacco: Current User    Types: Chew  Substance Use Topics  . Alcohol use: Yes    Frequency: Never    Comment: occaionally  . Drug use: No    No Known Allergies  Current Outpatient Medications  Medication Sig Dispense Refill  . amLODipine (NORVASC) 10 MG tablet Take 10 mg by mouth daily.    Marland Kitchen aspirin 81 MG chewable tablet Chew by mouth daily.    . enalapril (VASOTEC) 5 MG tablet Take 5 mg by mouth daily.    . metoprolol succinate (TOPROL-XL) 50 MG 24 hr tablet Take 50 mg by mouth daily. Take with or immediately following a meal.     No current facility-administered medications for this visit.     Review of Systems Review of Systems  Constitutional: Negative.   Respiratory: Negative.   Cardiovascular: Negative.     Blood pressure 136/80, pulse 82, resp. rate 12, height 5\' 8"  (1.727 m), weight 175 lb  (79.4 kg).  Physical Exam Physical Exam  Constitutional: He is oriented to person, place, and time. He appears well-developed and well-nourished.  Musculoskeletal:       Legs: Neurological: He is alert and oriented to person, place, and time.  Skin: Skin is warm.    Data Reviewed 20 cc of 0.5% Xylocaine with 0.25% Marcaine with 1-200,000 units of epinephrine was utilized and well-tolerated.  This was supplemented with 3 cc 1% plain Xylocaine.  An elliptical incision 4 cm in length was made over the mass.  The entire cyst wall was extracted.  Deep tissue was approximated with interrupted 3-0 Vicryl sutures.  The skin was closed with a running 3-0 Vicryl subarticular suture.  Benzoin, Steri-Strips followed by Telfa and Tegaderm dressing applied.  Assessment    Left posterior thigh sebaceous cyst excised without incident.    Plan   One week nurse for wound evaluation.   HPI, Physical Exam, Assessment and Plan have been scribed under the direction and in the presence of Hervey Ard, MD.  Gaspar Cola, CMA  I have completed the exam and reviewed the above documentation for accuracy and completeness.  I agree with the above.  Haematologist has been used and any errors in dictation or transcription are unintentional.  Hervey Ard, M.D., F.A.C.S.  Kevin Esparza Kevin Esparza 12/30/2017, 7:48 PM

## 2017-12-29 NOTE — Patient Instructions (Signed)
Return in ine week nurse 

## 2018-01-04 ENCOUNTER — Telehealth: Payer: Self-pay

## 2018-01-04 NOTE — Telephone Encounter (Signed)
-----   Message from Robert Bellow, MD sent at 01/03/2018  7:54 PM EST ----- Please notify the patient that the pathology was benign.  Skin cyst as expected. ----- Message ----- From: Interface, Lab In [facility] [place] [facility] Sent: 01/03/2018   6:34 PM To: Robert Bellow, MD

## 2018-01-04 NOTE — Telephone Encounter (Signed)
Notified patient as instructed, patient pleased. Discussed follow-up appointments, patient agrees  

## 2018-01-06 ENCOUNTER — Ambulatory Visit: Payer: BLUE CROSS/BLUE SHIELD | Admitting: *Deleted

## 2018-01-06 DIAGNOSIS — L723 Sebaceous cyst: Secondary | ICD-10-CM

## 2018-01-06 NOTE — Progress Notes (Signed)
Patient came in today for a wound check.  The wound is clean, with no signs of infection noted. .Follow up as scheduled.  

## 2018-01-06 NOTE — Patient Instructions (Signed)
Return as scheduled 

## 2018-01-10 ENCOUNTER — Other Ambulatory Visit: Payer: Self-pay

## 2018-01-10 ENCOUNTER — Encounter: Payer: Self-pay | Admitting: Urology

## 2018-01-10 ENCOUNTER — Other Ambulatory Visit: Payer: 59

## 2018-01-10 DIAGNOSIS — N401 Enlarged prostate with lower urinary tract symptoms: Secondary | ICD-10-CM

## 2018-01-19 DIAGNOSIS — S62609A Fracture of unspecified phalanx of unspecified finger, initial encounter for closed fracture: Secondary | ICD-10-CM | POA: Insufficient documentation

## 2018-03-14 ENCOUNTER — Emergency Department: Payer: BLUE CROSS/BLUE SHIELD

## 2018-03-14 ENCOUNTER — Other Ambulatory Visit: Payer: Self-pay

## 2018-03-14 ENCOUNTER — Emergency Department
Admission: EM | Admit: 2018-03-14 | Discharge: 2018-03-14 | Disposition: A | Discharge status: Home / Self Care | Payer: BLUE CROSS/BLUE SHIELD | Attending: Emergency Medicine | Admitting: Emergency Medicine

## 2018-03-14 ENCOUNTER — Encounter: Payer: Self-pay | Admitting: Emergency Medicine

## 2018-03-14 DIAGNOSIS — Z87891 Personal history of nicotine dependence: Secondary | ICD-10-CM | POA: Insufficient documentation

## 2018-03-14 DIAGNOSIS — G5621 Lesion of ulnar nerve, right upper limb: Secondary | ICD-10-CM

## 2018-03-14 DIAGNOSIS — R531 Weakness: Secondary | ICD-10-CM | POA: Diagnosis present

## 2018-03-14 DIAGNOSIS — I10 Essential (primary) hypertension: Secondary | ICD-10-CM | POA: Diagnosis not present

## 2018-03-14 DIAGNOSIS — Z7982 Long term (current) use of aspirin: Secondary | ICD-10-CM | POA: Insufficient documentation

## 2018-03-14 LAB — CBC
HCT: 45.5 % (ref 40.0–52.0)
Hemoglobin: 16.1 g/dL (ref 13.0–18.0)
MCH: 32.7 pg (ref 26.0–34.0)
MCHC: 35.3 g/dL (ref 32.0–36.0)
MCV: 92.5 fL (ref 80.0–100.0)
Platelets: 232 10*3/uL (ref 150–440)
RBC: 4.92 MIL/uL (ref 4.40–5.90)
RDW: 14.1 % (ref 11.5–14.5)
WBC: 10.6 10*3/uL (ref 3.8–10.6)

## 2018-03-14 LAB — BASIC METABOLIC PANEL
Anion gap: 9 (ref 5–15)
BUN: 14 mg/dL (ref 6–20)
CO2: 22 mmol/L (ref 22–32)
Calcium: 9.4 mg/dL (ref 8.9–10.3)
Chloride: 105 mmol/L (ref 101–111)
Creatinine, Ser: 1.29 mg/dL — ABNORMAL HIGH (ref 0.61–1.24)
GFR calc Af Amer: >60 mL/min (ref >60)
GFR calc non Af Amer: 60 mL/min — ABNORMAL LOW (ref >60)
Glucose, Bld: 81 mg/dL (ref 65–99)
Potassium: 4.4 mmol/L (ref 3.5–5.1)
Sodium: 136 mmol/L (ref 135–145)

## 2018-03-14 LAB — TROPONIN I: Troponin I: <0.03 ng/mL (ref <0.03)

## 2018-03-14 MED ORDER — HYDROCODONE-ACETAMINOPHEN 5-325 MG PO TABS: 1.0000 | ORAL_TABLET | ORAL | 0 refills | Status: DC | PRN

## 2018-03-14 MED ORDER — PREDNISONE 20 MG PO TABS: 40.0000 mg | ORAL_TABLET | Freq: Every day | ORAL | 0 refills | Status: DC

## 2018-03-14 NOTE — ED Triage Notes (Signed)
Pt here with c/o right sided "shooting" cp since Saturday, pain radiating down his right arm, states fourth and fifth digits on right hand completely numb which began yesterday.  Appears in NAD.

## 2018-03-14 NOTE — Discharge Instructions (Addendum)
Please follow-up with your primary care doctor in the next 24 hours for recheck/reevaluation.  Please take your steroids as prescribed for the next 5 days, pain medication as needed, but only as written.  Return to the emergency department for any headache, confusion, slurred speech, weakness or numbness of any arm or leg.

## 2018-03-14 NOTE — ED Provider Notes (Signed)
[facility] Emergency Department Provider Note  Time seen: 2:37 PM  I have reviewed the triage vital signs and the nursing notes.   HISTORY  Chief Complaint Chest Pain    HPI Kevin Esparza is a 57 y.o. male with a past medical history of anxiety, hypertension, hyperlipidemia, presents to the emergency department for right arm weakness and numbness.  According to the patient he awoke Saturday morning feeling like his right arm was asleep he describes weakness and numbness only in the medial aspect of the right arm and down his fourth and fifth fingers.  Patient also states some pain in his shoulder on the right side as well.  Denies any headache, denies any slurred speech or confusion.  Patient states the pain is been ongoing since Saturday morning which she describes as a shooting pain in his elbow sometimes down to his wrist and sometimes up into his shoulder.  Is able to move the shoulder, states the pain is constant and not necessarily worse with movement.  No neck pain.   Past Medical History:  Diagnosis Date  . Anxiety   . ED (erectile dysfunction)   . Elevated PSA   . Heartburn   . History of BPH   . HLD (hyperlipidemia)   . HTN (hypertension)   . Pneumonia     Patient Active Problem List   Diagnosis Date Noted  . Sebaceous cyst 12/06/2017    Past Surgical History:  Procedure Laterality Date  . APPENDECTOMY  2005  . CERVICAL SPINE SURGERY  1997   c4-c7  . CHOLECYSTECTOMY  2005  . TONSILLECTOMY      Prior to Admission medications   Medication Sig Start Date End Date Taking? Authorizing Provider  amLODipine (NORVASC) 10 MG tablet Take 10 mg by mouth daily.    [provider]  aspirin 81 MG chewable tablet Chew by mouth daily.    [provider]  enalapril (VASOTEC) 5 MG tablet Take 5 mg by mouth daily.    [provider]  metoprolol succinate (TOPROL-XL) 50 MG 24 hr tablet Take 50 mg by mouth daily. Take with or  immediately following a meal.    [provider]    No Known Allergies  Family History  Problem Relation Age of Onset  . Kidney cancer Maternal Grandmother   . Stroke Father   . Hypertension Mother   . Diabetes Mother   . Osteoarthritis Mother   . Osteoporosis Mother   . Kidney disease Neg Hx   . Prostate cancer Neg Hx   . Bladder Cancer Neg Hx     Social History Social History   Tobacco Use  . Smoking status: Former Smoker    Years: 14.00    Types: Cigarettes    Last attempt to quit: 03/05/2004    Years since quitting: 14.0  . Smokeless tobacco: Current User    Types: Chew  Substance Use Topics  . Alcohol use: Yes    Frequency: Never    Comment: occaionally  . Drug use: No    Review of Systems Constitutional: Negative for fever. Eyes: Negative for visual complaints ENT: Negative for recent illness/congestion Cardiovascular: Negative for chest pain. Respiratory: Negative for shortness of breath. Gastrointestinal: Negative for abdominal pain, vomiting  Genitourinary: Negative for urinary compaints Musculoskeletal: Negative for musculoskeletal complaints Skin: Negative for skin complaints  Neurological: Negative for headache All other ROS negative  ____________________________________________   PHYSICAL EXAM:  VITAL SIGNS: ED Triage Vitals  Enc Vitals  Group     BP 03/14/18 1131 106/69     Pulse Rate 03/14/18 1131 70     Resp 03/14/18 1131 18     Temp 03/14/18 1131 (!) 97.5 F (36.4 C)     Temp Source 03/14/18 1131 Oral     SpO2 03/14/18 1131 97 %     Weight 03/14/18 1132 186 lb (84.4 kg)     Height 03/14/18 1132 5\' 10"  (1.778 m)     Head Circumference --      Peak Flow --      Pain Score 03/14/18 1132 10     Pain Loc --      Pain Edu? --      Excl. in [place]? --    Constitutional: Alert and oriented. Well appearing and in no distress. Eyes: Normal exam ENT   Head: Normocephalic and atraumatic.   Mouth/Throat: Mucous membranes are  moist. Cardiovascular: Normal rate, regular rhythm. No murmur Respiratory: Normal respiratory effort without tachypnea nor retractions. Breath sounds are clear Gastrointestinal: Soft and nontender. No distention. Musculoskeletal: Nontender with normal range of motion in all extremities.  Neurologic:  Normal speech and language.  Patient has decreased sensation in the fifth and fourth digits following a ulnar pattern.  Slight pronator drift, no other abnormalities.  5/5 motor lower 70s, no drift.  Cranial nerves intact. Skin:  Skin is warm, dry and intact.  Psychiatric: Mood and affect are normal.   ____________________________________________    EKG  EKG reviewed and interpreted by myself shows normal sinus rhythm at 71 bpm with a narrow QRS, normal axis, normal intervals, no concerning ST changes.  Normal EKG.  ____________________________________________    RADIOLOGY  Chest x-ray negative CT had negative  ____________________________________________   INITIAL IMPRESSION / ASSESSMENT AND PLAN / ED COURSE  Pertinent labs & imaging results that were available during my care of the patient were reviewed by me and considered in my medical decision making (see chart for details).  Patient presents to the emergency department for right arm numbness/weakness.  Differential would include CVA, muscular skeletal pain, ulnar neuropathy.  Patient symptoms are very consistent with an ulnar neuropathy.  Patient describes pain along the medial aspect of the right arm down into the right fourth and fifth digits with decreased sensation/numbness sensation in the fourth and fifth digits only states the first 3 digits are normal.  On examination the patient does have a slight pronator drift of the right upper extremity but no other neurological findings.  Patient denies any headache, confusion or slurred speech.  Denies any specific injury to the arm.  Overall exam is consistent with ulnar neuropathy  however given the slight upper extremity drift on exam will obtain a CT head as a precaution.  Patient's work-up otherwise is normal including labs including cardiac enzymes.  CT scan has resulted normal.  We will discharge patient home with a course of pain medication as well as 5 days of steroids.  Patient is to follow-up with a primary care physician.  Patient agreeable to this plan of care.  ____________________________________________   FINAL CLINICAL IMPRESSION(S) / ED DIAGNOSES  Ulnar neuropathy    Harvest Dark, MD 03/14/18 1447

## 2018-03-14 NOTE — ED Notes (Signed)
Pt reports pain in his right shoulder that radiates into his elbow since Saturday. Pt reports the pain is constant. Pt reports was sleeping when the pain woke him up. Pt denies NV, sweating, weakness or any other symptoms. Pt with full ROM but states his right arm is "floppy".

## 2018-08-29 ENCOUNTER — Other Ambulatory Visit: Payer: 59

## 2018-08-29 ENCOUNTER — Encounter: Payer: Self-pay | Admitting: Urology

## 2018-09-04 ENCOUNTER — Encounter: Payer: Self-pay | Admitting: Urology

## 2018-09-04 ENCOUNTER — Ambulatory Visit (INDEPENDENT_AMBULATORY_CARE_PROVIDER_SITE_OTHER): Payer: 59 | Admitting: Urology

## 2018-09-04 ENCOUNTER — Ambulatory Visit: Payer: 59 | Admitting: Urology

## 2018-09-04 VITALS — BP 133/80 | HR 80 | Ht 70.0 in | Wt 190.6 lb

## 2018-09-04 DIAGNOSIS — Z87898 Personal history of other specified conditions: Secondary | ICD-10-CM | POA: Diagnosis not present

## 2018-09-04 DIAGNOSIS — N401 Enlarged prostate with lower urinary tract symptoms: Secondary | ICD-10-CM | POA: Diagnosis not present

## 2018-09-04 DIAGNOSIS — N138 Other obstructive and reflux uropathy: Secondary | ICD-10-CM | POA: Diagnosis not present

## 2018-09-04 DIAGNOSIS — N529 Male erectile dysfunction, unspecified: Secondary | ICD-10-CM

## 2018-09-04 MED ORDER — SILDENAFIL CITRATE 20 MG PO TABS: ORAL_TABLET | ORAL | 3 refills | Status: DC

## 2018-09-04 NOTE — Progress Notes (Signed)
09/04/2018 3:20 PM   [name] [birth year] [id number]  Referring provider: Tandy Gaw, [address] [place], [place] [address]  Chief Complaint  Patient presents with  . Erectile Dysfunction    follow up    HPI: Patient is 57 year old Caucasian male with a history of elevated PSA, BPH with LU TS and ED who presents today for a one year follow up.    Erectile dysfunction His SHIM score is 19, which is mild erectile dysfunction.   His previous SHIM score was 18.  He admits that he is been having difficulty with erections for several years.   His major complaint is lack of firmness.  His libido is preserved.   He is no longer having spontaneous erections at this time.  His risk factors for ED are age, BPH, HTN and anxiety.  He denies any painful erections or curvatures with his erections.    SHIM    Row Name 09/04/18 1436         SHIM: Over the last 6 months:   How do you rate your confidence that you could get and keep an erection?  Moderate     When you had erections with sexual stimulation, how often were your erections hard enough for penetration (entering your partner)?  Most Times (much more than half the time)     During sexual intercourse, how often were you able to maintain your erection after you had penetrated (entered) your partner?  Most Times (much more than half the time)     During sexual intercourse, how difficult was it to maintain your erection to completion of intercourse?  Slightly Difficult     When you attempted sexual intercourse, how often was it satisfactory for you?  Most Times (much more than half the time)       SHIM Total Score   SHIM  19        Score: 1-7 Severe ED 8-11 Moderate ED 12-16 Mild-Moderate ED 17-21 Mild ED 22-25 No ED  Weight loss due to new job which requires a lot of walking.    PMH: Past Medical History:  Diagnosis Date  . Anxiety   . ED (erectile dysfunction)   . Elevated PSA   . Heartburn   . History  of BPH   . HLD (hyperlipidemia)   . HTN (hypertension)   . Pneumonia     Surgical History: Past Surgical History:  Procedure Laterality Date  . APPENDECTOMY  2005  . CERVICAL SPINE SURGERY  1997   c4-c7  . CHOLECYSTECTOMY  2005  . TONSILLECTOMY      Home Medications:  Allergies as of 09/04/2018   No Known Allergies     Medication List        Accurate as of 09/04/18  3:20 PM. Always use your most recent med list.          amLODipine 10 MG tablet Commonly known as:  NORVASC Take 10 mg by mouth daily.   aspirin 81 MG chewable tablet Chew by mouth daily.   cyclobenzaprine 10 MG tablet Commonly known as:  FLEXERIL Take 10 mg by mouth daily.   enalapril 5 MG tablet Commonly known as:  VASOTEC Take 5 mg by mouth daily.   metoprolol succinate 50 MG 24 hr tablet Commonly known as:  TOPROL-XL Take 50 mg by mouth daily. Take with or immediately following a meal.   sildenafil 20 MG tablet Commonly known as:  REVATIO Take 3  to 5 tablets two hours before intercouse on an empty stomach.  Do not take with nitrates.       Allergies: No Known Allergies  Family History: Family History  Problem Relation Age of Onset  . Kidney cancer Maternal Grandmother   . Stroke Father   . Hypertension Mother   . Diabetes Mother   . Osteoarthritis Mother   . Osteoporosis Mother   . Kidney disease Neg Hx   . Prostate cancer Neg Hx   . Bladder Cancer Neg Hx     Social History:  reports that he quit smoking about 14 years ago. His smoking use included cigarettes. He quit after 14.00 years of use. His smokeless tobacco use includes chew. He reports that he drinks about 1.0 standard drinks of alcohol per week. He reports that he does not use drugs.  ROS: UROLOGY Frequent Urination?: No Hard to postpone urination?: No Burning/pain with urination?: No Get up at night to urinate?: No Leakage of urine?: Yes Urine stream starts and stops?: No Trouble starting stream?: No Do you  have to strain to urinate?: No Blood in urine?: No Urinary tract infection?: No Sexually transmitted disease?: No Injury to kidneys or bladder?: No Painful intercourse?: No Weak stream?: Yes Erection problems?: No Penile pain?: No  Gastrointestinal Nausea?: No Vomiting?: No Indigestion/heartburn?: Yes Diarrhea?: No  Constitutional Fever: No Night sweats?: No Weight loss?: No Fatigue?: No  Skin Skin rash/lesions?: No Itching?: No  Eyes Blurred vision?: No  Ears/Nose/Throat Sore throat?: No Sinus problems?: Yes  Hematologic/Lymphatic Swollen glands?: No Easy bruising?: No  Cardiovascular Leg swelling?: No Chest pain?: No  Respiratory Cough?: No Shortness of breath?: No  Endocrine Excessive thirst?: Yes  Musculoskeletal Back pain?: Yes Joint pain?: No  Neurological Headaches?: No Dizziness?: No  Psychologic Depression?: No Anxiety?: No  Physical Exam: BP 133/80 (BP Location: Left Arm, Patient Position: Sitting, Cuff Size: Normal)   Pulse 80   Ht 5\' 10"  (1.778 m)   Wt 190 lb 9.6 oz (86.5 kg)   BMI 27.35 kg/m   Constitutional: Well nourished. Alert and oriented, No acute distress. HEENT: [place] AT, moist mucus membranes. Trachea midline, no masses. Cardiovascular: No clubbing, cyanosis, or edema. Respiratory: Normal respiratory effort, no increased work of breathing. GI: Abdomen is soft, non tender, non distended, no abdominal masses. Liver and spleen not palpable.  No hernias appreciated.  Stool sample for occult testing is not indicated.   GU: No CVA tenderness.  No bladder fullness or masses.  Patient with circumcised phallus.  Urethral meatus is patent.  No penile discharge. No penile lesions or rashes. Scrotum without lesions, cysts, rashes and/or edema.  Testicles are located scrotally bilaterally. No masses are appreciated in the testicles. Left and right epididymis are normal. Rectal: Patient with  normal sphincter tone. Anus and perineum  without scarring or rashes. No rectal masses are appreciated. Prostate is approximately 50 grams, could only palpate apex and midportion, no nodules are appreciated.  Skin: No rashes, bruises or suspicious lesions. Lymph: No cervical or inguinal adenopathy. Neurologic: Grossly intact, no focal deficits, moving all 4 extremities. Psychiatric: Normal mood and affect.   Laboratory Data: PSA History  1.0 ng/mL on 01/16/2013   0.37  Free PSA    37%     1.3 ng/mL on 06/16/2014   0.5  Free PSA  38%   4.7 ng/mL on 08/05/2016   1.6  Free PSA  34%  3.3 ng/mL on 08/26/2016  2.8 ng/mL on 12/28/2016  1.9  ng/mL on 07/29/2017  I have reviewed the labs.  Assessment & Plan:    1. History of elevated PSA PSA drawn today  2. BPH with LUTS  - IPSS score is 10/3, it is slightly worse  - Continue conservative management, avoiding bladder irritants and timed voiding's  - RTC 12 months for I PSS, PSA and exam   3. Erectile dysfunction  - SHIM score is 19, it is improved  - We discussed trying a PDE5 inhibitor - Sildenafil 20 mg, 3 to 5 tablets two hours prior to intercourse on an empty stomach, # 66; he is warned not to take medications that contain nitrates.  I also advised him of the side effects, such as: headache, flushing, dyspepsia, abnormal vision, nasal congestion, back pain, myalgia, nausea, dizziness, and rash.  - RTC in one month for SHIM score   Return in about 1 year (around 09/05/2019) for IPSS, PSA and exam.  These notes generated with voice recognition software. I apologize for typographical errors.  Zara Council, PA-C  [facility] [address] [place], [place] [address] [phone]

## 2018-09-05 ENCOUNTER — Telehealth: Payer: Self-pay

## 2018-09-05 LAB — PSA: Prostate Specific Ag, Serum: 1.6 ng/mL (ref 0.0–4.0)

## 2018-09-05 NOTE — Telephone Encounter (Signed)
Called pt informed him of the information below. Pt gave verbal understanding.  

## 2018-09-05 NOTE — Telephone Encounter (Signed)
-----   Message from [name], PA-C sent at 09/05/2018  7:54 AM EST ----- Please let Kevin Esparza know that his PSA is stable at 1.6.  We will see him next year.

## 2019-06-29 ENCOUNTER — Ambulatory Visit
Admission: RE | Admit: 2019-06-29 | Discharge: 2019-06-29 | Disposition: A | Discharge status: Home / Self Care | Payer: Disability Insurance | Source: Ambulatory Visit | Attending: Family Medicine | Admitting: Family Medicine

## 2019-06-29 ENCOUNTER — Other Ambulatory Visit: Payer: Self-pay | Admitting: Family Medicine

## 2019-06-29 DIAGNOSIS — M5020 Other cervical disc displacement, unspecified cervical region: Secondary | ICD-10-CM | POA: Diagnosis present

## 2019-09-04 ENCOUNTER — Other Ambulatory Visit: Payer: Self-pay

## 2019-09-04 DIAGNOSIS — Z87898 Personal history of other specified conditions: Secondary | ICD-10-CM

## 2019-09-05 ENCOUNTER — Other Ambulatory Visit: Payer: 59

## 2019-09-07 ENCOUNTER — Ambulatory Visit: Payer: 59 | Admitting: Urology

## 2019-10-01 ENCOUNTER — Encounter: Payer: Self-pay | Admitting: Urology

## 2019-10-01 ENCOUNTER — Other Ambulatory Visit: Payer: 59

## 2019-10-02 NOTE — Progress Notes (Deleted)
10/03/2019 9:04 PM   Kevin Esparza Jun 07, 1961 [id number]  Referring provider: Ranae Plumber, [address] [place],  [place] [address]  No chief complaint on file.   HPI: Patient is 58 year old male with a history of elevated PSA, BPH with LU TS and ED who presents today for a one year follow up.    History of elevated PSA Referred for PSA of 4.7 in 2017 - repeated PSA continued downward trend Component     Latest Ref Rng & Units 08/26/2016 12/28/2016 07/29/2017 09/04/2018  Prostate Specific Ag, Serum     0.0 - 4.0 ng/mL 3.3 2.8 1.9 1.6   BPH WITH LUTS  (prostate and/or bladder) IPSS score: *** PVR: ***   Previous score: ***   Previous PVR: ***   Major complaint(s):  x *** years. Denies any dysuria, hematuria or suprapubic pain.   Currently taking: ***.  His has had ***.   Denies any recent fevers, chills, nausea or vomiting.  He has a family history of PCa, with ***.   He does not have a family history of PCa.***    Score:  1-7 Mild 8-19 Moderate 20-35 Severe  Erectile dysfunction His SHIM score is ***, which is *** erectile dysfunction.   His previous SHIM score was 19.  He admits that he is been having difficulty with erections for several years.   His major complaint is lack of firmness.  His libido is preserved.   He is no longer having spontaneous erections at this time.  His risk factors for ED are age, BPH, HTN and anxiety.  He denies any painful erections or curvatures with his erections.      Score: 1-7 Severe ED 8-11 Moderate ED 12-16 Mild-Moderate ED 17-21 Mild ED 22-25 No ED   PMH: Past Medical History:  Diagnosis Date  . Anxiety   . ED (erectile dysfunction)   . Elevated PSA   . Heartburn   . History of BPH   . HLD (hyperlipidemia)   . HTN (hypertension)   . Pneumonia     Surgical History: Past Surgical History:  Procedure Laterality Date  . APPENDECTOMY  2005  . CERVICAL SPINE SURGERY  1997   c4-c7  . CHOLECYSTECTOMY   2005  . TONSILLECTOMY      Home Medications:  Allergies as of 10/03/2019   No Known Allergies     Medication List       Accurate as of October 02, 2019  9:04 PM. If you have any questions, ask your nurse or doctor.        amLODipine 10 MG tablet Commonly known as: NORVASC Take 10 mg by mouth daily.   aspirin 81 MG chewable tablet Chew by mouth daily.   cyclobenzaprine 10 MG tablet Commonly known as: FLEXERIL Take 10 mg by mouth daily.   enalapril 5 MG tablet Commonly known as: VASOTEC Take 5 mg by mouth daily.   metoprolol succinate 50 MG 24 hr tablet Commonly known as: TOPROL-XL Take 50 mg by mouth daily. Take with or immediately following a meal.   sildenafil 20 MG tablet Commonly known as: REVATIO Take 3 to 5 tablets two hours before intercouse on an empty stomach.  Do not take with nitrates.       Allergies: No Known Allergies  Family History: Family History  Problem Relation Age of Onset  . Kidney cancer Maternal Grandmother   . Stroke Father   . Hypertension Mother   . Diabetes  Mother   . Osteoarthritis Mother   . Osteoporosis Mother   . Kidney disease Neg Hx   . Prostate cancer Neg Hx   . Bladder Cancer Neg Hx     Social History:  reports that he quit smoking about 15 years ago. His smoking use included cigarettes. He quit after 14.00 years of use. His smokeless tobacco use includes chew. He reports current alcohol use of about 1.0 standard drinks of alcohol per week. He reports that he does not use drugs.  ROS:                                        Physical Exam: There were no vitals taken for this visit.  Constitutional:  Well nourished. Alert and oriented, No acute distress. HEENT: [place] AT, moist mucus membranes.  Trachea midline, no masses. Cardiovascular: No clubbing, cyanosis, or edema. Respiratory: Normal respiratory effort, no increased work of breathing. GI: Abdomen is soft, non tender, non distended, no  abdominal masses. Liver and spleen not palpable.  No hernias appreciated.  Stool sample for occult testing is not indicated.   GU: No CVA tenderness.  No bladder fullness or masses.  Patient with circumcised/uncircumcised phallus. ***Foreskin easily retracted***  Urethral meatus is patent.  No penile discharge. No penile lesions or rashes. Scrotum without lesions, cysts, rashes and/or edema.  Testicles are located scrotally bilaterally. No masses are appreciated in the testicles. Left and right epididymis are normal. Rectal: Patient with  normal sphincter tone. Anus and perineum without scarring or rashes. No rectal masses are appreciated. Prostate is approximately *** grams, *** nodules are appreciated. Seminal vesicles are normal. Skin: No rashes, bruises or suspicious lesions. Lymph: No cervical or inguinal adenopathy. Neurologic: Grossly intact, no focal deficits, moving all 4 extremities. Psychiatric: Normal mood and affect.   Laboratory Data: PSA History  1.0 ng/mL on 01/16/2013   0.37  Free PSA    37%     1.3 ng/mL on 06/16/2014   0.5  Free PSA  38%   4.7 ng/mL on 08/05/2016   1.6  Free PSA  34%  3.3 ng/mL on 08/26/2016  2.8 ng/mL on 12/28/2016  1.9 ng/mL on 07/29/2017  I have reviewed the labs.  Assessment & Plan:    1. History of elevated PSA PSA drawn today  2. BPH with LUTS  - IPSS score is 10/3, it is slightly worse  - Continue conservative management, avoiding bladder irritants and timed voiding's  - RTC 12 months for I PSS, PSA and exam   3. Erectile dysfunction  - SHIM score is 19, it is improved  - We discussed trying a PDE5 inhibitor - Sildenafil 20 mg, 3 to 5 tablets two hours prior to intercourse on an empty stomach, # 56; he is warned not to take medications that contain nitrates.  I also advised him of the side effects, such as: headache, flushing, dyspepsia, abnormal vision, nasal congestion, back pain, myalgia, nausea, dizziness, and rash.  - RTC in one  month for SHIM score   No follow-ups on file.  These notes generated with voice recognition software. I apologize for typographical errors.  Zara Council, PA-C  [facility] [address] [place], [place] [address] [phone]

## 2019-10-03 ENCOUNTER — Ambulatory Visit: Payer: 59 | Admitting: Urology

## 2019-10-03 ENCOUNTER — Encounter: Payer: Self-pay | Admitting: Urology

## 2020-01-30 ENCOUNTER — Telehealth: Payer: Self-pay | Admitting: Urology

## 2020-01-30 NOTE — Telephone Encounter (Signed)
Would you call Kevin Esparza and have him reschedule his appointment for a history of an elevated PSA?

## 2020-01-31 NOTE — Telephone Encounter (Signed)
Appointments made. Patient aware. 

## 2020-02-04 ENCOUNTER — Other Ambulatory Visit: Payer: Self-pay

## 2020-02-04 DIAGNOSIS — Z87898 Personal history of other specified conditions: Secondary | ICD-10-CM

## 2020-02-05 ENCOUNTER — Ambulatory Visit (INDEPENDENT_AMBULATORY_CARE_PROVIDER_SITE_OTHER): Payer: Disability Insurance | Admitting: Urology

## 2020-02-05 ENCOUNTER — Encounter: Payer: Self-pay | Admitting: Urology

## 2020-02-05 VITALS — BP 130/72 | HR 76 | Ht 70.0 in | Wt 185.0 lb

## 2020-02-05 DIAGNOSIS — N401 Enlarged prostate with lower urinary tract symptoms: Secondary | ICD-10-CM

## 2020-02-05 DIAGNOSIS — N529 Male erectile dysfunction, unspecified: Secondary | ICD-10-CM

## 2020-02-05 DIAGNOSIS — N138 Other obstructive and reflux uropathy: Secondary | ICD-10-CM

## 2020-02-05 LAB — PSA: Prostate Specific Ag, Serum: 2.4 ng/mL (ref 0.0–4.0)

## 2020-02-05 NOTE — Progress Notes (Signed)
02/05/2020 3:22 PM   [name] 09-11-1961 [id number]  Referring provider: Ranae Plumber, [place],  [place] [address]  Chief Complaint  Patient presents with  . Elevated PSA    HPI: Patient is 59 year old male with a history of elevated PSA, BPH with LU TS and ED who presents today for a one year follow up.    Erectile dysfunction He stated that once he decreased his [facility] intake, his erections improved.  BPH WITH LUTS  (prostate and/or bladder) IPSS score: 6/1   Previous score: 9/0   Previous PVR: 44 mL  Major complaint(s): An occasional episode of urinary dribbling, but not bothersome.  Denies any dysuria, hematuria or suprapubic pain.    Denies any recent fevers, chills, nausea or vomiting.  He states that he has noted some dark brown urine after he has not urinated for several hours.  He does not have a family history of PCa.  IPSS    Row Name 02/05/20 1400         International Prostate Symptom Score   How often have you had the sensation of not emptying your bladder?  Less than 1 in 5     How often have you had to urinate less than every two hours?  Less than 1 in 5 times     How often have you found you stopped and started again several times when you urinated?  Less than 1 in 5 times     How often have you found it difficult to postpone urination?  Less than 1 in 5 times     How often have you had a weak urinary stream?  Less than 1 in 5 times     How often have you had to strain to start urination?  Not at All     How many times did you typically get up at night to urinate?  1 Time     Total IPSS Score  6       Quality of Life due to urinary symptoms   If you were to spend the rest of your life with your urinary condition just the way it is now how would you feel about that?  Pleased        Score:  1-7 Mild 8-19 Moderate 20-35 Severe   PMH: Past Medical History:  Diagnosis Date  . Anxiety   . ED (erectile  dysfunction)   . Elevated PSA   . Heartburn   . History of BPH   . HLD (hyperlipidemia)   . HTN (hypertension)   . Pneumonia     Surgical History: Past Surgical History:  Procedure Laterality Date  . APPENDECTOMY  2005  . CERVICAL SPINE SURGERY  1997   c4-c7  . CHOLECYSTECTOMY  2005  . TONSILLECTOMY      Home Medications:  Allergies as of 02/05/2020   No Known Allergies     Medication List       Accurate as of February 05, 2020  3:22 PM. If you have any questions, ask your nurse or doctor.        amLODipine 10 MG tablet Commonly known as: NORVASC Take 10 mg by mouth daily.   aspirin 81 MG chewable tablet Chew by mouth daily.   cyclobenzaprine 10 MG tablet Commonly known as: FLEXERIL Take 10 mg by mouth daily.   enalapril 5 MG tablet Commonly known as: VASOTEC Take 5 mg by mouth daily.  gabapentin 300 MG capsule Commonly known as: NEURONTIN Take 300 mg by mouth 3 (three) times daily.   metoprolol succinate 50 MG 24 hr tablet Commonly known as: TOPROL-XL Take 50 mg by mouth daily. Take with or immediately following a meal.   sildenafil 20 MG tablet Commonly known as: REVATIO Take 3 to 5 tablets two hours before intercouse on an empty stomach.  Do not take with nitrates.       Allergies: No Known Allergies  Family History: Family History  Problem Relation Age of Onset  . Kidney cancer Maternal Grandmother   . Stroke Father   . Hypertension Mother   . Diabetes Mother   . Osteoarthritis Mother   . Osteoporosis Mother   . Kidney disease Neg Hx   . Prostate cancer Neg Hx   . Bladder Cancer Neg Hx     Social History:  reports that he quit smoking about 15 years ago. His smoking use included cigarettes. He quit after 14.00 years of use. His smokeless tobacco use includes chew. He reports current alcohol use of about 1.0 standard drinks of alcohol per week. He reports that he does not use drugs.  ROS: For pertinent review of systems please refer to  history of present illness  Physical Exam: BP 130/72   Pulse 76   Ht 5\' 10"  (1.778 m)   Wt 185 lb (83.9 kg)   BMI 26.54 kg/m   Constitutional:  Well nourished. Alert and oriented, No acute distress. HEENT: [place] AT, mask in place.  Trachea midline. Cardiovascular: No clubbing, cyanosis, or edema. Respiratory: Normal respiratory effort, no increased work of breathing. GI: Abdomen is soft, non tender, non distended, no abdominal masses.  GU: No CVA tenderness.  No bladder fullness or masses.  Patient with circumcised phallus.  Urethral meatus is patent.  No penile discharge. No penile lesions or rashes. Scrotum without lesions, cysts, rashes and/or edema.  Testicles are located scrotally bilaterally. No masses are appreciated in the testicles. Left and right epididymis are normal. Rectal: Patient with  normal sphincter tone. Anus and perineum without scarring or rashes. No rectal masses are appreciated. Prostate is approximately 50 + grams, could not palpate the outer boundaries of the prostate, no nodules are appreciated. Seminal vesicles could not be palpated Skin: No rashes, bruises or suspicious lesions. Lymph: No inguinal adenopathy. Neurologic: Grossly intact, no focal deficits, moving all 4 extremities. Psychiatric: Normal mood and affect.   Laboratory Data: PSA History  1.0 ng/mL on 01/16/2013   0.37  Free PSA    37%     1.3 ng/mL on 06/16/2014   0.5  Free PSA  38%   4.7 ng/mL on 08/05/2016   1.6  Free PSA  34%  3.3 ng/mL on 08/26/2016  2.8 ng/mL on 12/28/2016  1.9 ng/mL on 07/29/2017 Component     Latest Ref Rng & Units 08/26/2016 12/28/2016 07/29/2017 09/04/2018  Prostate Specific Ag, Serum     0.0 - 4.0 ng/mL 3.3 2.8 1.9 1.6   Component     Latest Ref Rng & Units 02/04/2020  Prostate Specific Ag, Serum     0.0 - 4.0 ng/mL 2.4    I have reviewed the labs.  Assessment & Plan:    1. History of elevated PSA PSA has increased greater than 0.75 over the last year by a  small margin We will recheck his PSA only in 6 months as he has a history of an elevated PSA to ensure upward trend does not  continue  2. BPH with LUTS  - IPSS score is 6/1, it is improved  - Continue conservative management, avoiding bladder irritants and timed voiding's  - RTC 12 months for I PSS, PSA and exam   3. Erectile dysfunction No issues at this time   Return in about 6 months (around 08/06/2020) for PSA only .  These notes generated with voice recognition software. I apologize for typographical errors.  Zara Council, PA-C  [facility] [address] [place], [place] [address] [phone]

## 2020-02-06 LAB — MICROSCOPIC EXAMINATION
Bacteria, UA: NONE SEEN
Epithelial Cells (non renal): NONE SEEN /hpf (ref 0–10)
RBC: NONE SEEN /hpf (ref 0–2)

## 2020-02-06 LAB — URINALYSIS, COMPLETE
Bilirubin, UA: NEGATIVE
Glucose, UA: NEGATIVE
Ketones, UA: NEGATIVE
Leukocytes,UA: NEGATIVE
Nitrite, UA: NEGATIVE
Protein,UA: NEGATIVE
RBC, UA: NEGATIVE
Specific Gravity, UA: 1.015 (ref 1.005–1.030)
Urobilinogen, Ur: 0.2 mg/dL (ref 0.2–1.0)
pH, UA: 5.5 (ref 5.0–7.5)

## 2020-08-01 ENCOUNTER — Other Ambulatory Visit: Payer: Self-pay

## 2020-08-01 DIAGNOSIS — Z87898 Personal history of other specified conditions: Secondary | ICD-10-CM

## 2020-08-04 ENCOUNTER — Other Ambulatory Visit: Payer: Disability Insurance

## 2020-08-04 ENCOUNTER — Other Ambulatory Visit: Payer: Self-pay

## 2020-08-04 DIAGNOSIS — Z87898 Personal history of other specified conditions: Secondary | ICD-10-CM

## 2020-08-05 ENCOUNTER — Telehealth: Payer: Self-pay | Admitting: Family Medicine

## 2020-08-05 LAB — PSA: Prostate Specific Ag, Serum: 2.4 ng/mL (ref 0.0–4.0)

## 2020-08-05 NOTE — Telephone Encounter (Signed)
Patient notified and voiced understanding. Appointments have been made.  

## 2020-08-05 NOTE — Telephone Encounter (Signed)
-----   Message from [name], PA-C sent at 08/05/2020  7:46 AM EDT ----- Please let Mr. Kevin Esparza know that his PSA remains the same at 2.4.  I would like to see him in 6 months for PSA, IPS'S, SHIM and exam -PSA prior.

## 2020-08-20 ENCOUNTER — Encounter: Payer: Self-pay | Admitting: Family Medicine

## 2020-10-30 ENCOUNTER — Ambulatory Visit: Payer: Disability Insurance | Admitting: Gastroenterology

## 2020-12-17 ENCOUNTER — Encounter: Payer: Self-pay | Admitting: Gastroenterology

## 2020-12-17 ENCOUNTER — Ambulatory Visit (INDEPENDENT_AMBULATORY_CARE_PROVIDER_SITE_OTHER): Payer: Medicare Other | Admitting: Gastroenterology

## 2020-12-17 ENCOUNTER — Other Ambulatory Visit: Payer: Self-pay

## 2020-12-17 VITALS — BP 137/76 | HR 72 | Temp 98.1°F | Ht 70.0 in | Wt 188.6 lb

## 2020-12-17 DIAGNOSIS — K30 Functional dyspepsia: Secondary | ICD-10-CM | POA: Diagnosis not present

## 2020-12-17 DIAGNOSIS — R1319 Other dysphagia: Secondary | ICD-10-CM

## 2020-12-17 DIAGNOSIS — Z1211 Encounter for screening for malignant neoplasm of colon: Secondary | ICD-10-CM

## 2020-12-17 DIAGNOSIS — R1310 Dysphagia, unspecified: Secondary | ICD-10-CM

## 2020-12-17 MED ORDER — NA SULFATE-K SULFATE-MG SULF 17.5-3.13-1.6 GM/177ML PO SOLN: ORAL | 0 refills | Status: DC

## 2020-12-17 NOTE — Progress Notes (Signed)
Kevin Esparza [address]  [place]  [place], [place] [address]  Main: [phone]  Fax: [phone]   Gastroenterology Consultation  Referring Provider:     Ranae Plumber, [place] Primary Care Physician:  Ranae Plumber, [place] Reason for Consultation:     Dysphagia        HPI:   Chief complaint: Dysphagia  Kevin Esparza is a 60 y.o. y/o male referred for consultation & management  by Dr. Ranae Plumber, [place].  Patient reports 1 yr history of dysphagia to solid and liquid foods.  No nausea or vomiting.  No weight loss.  No episodes of food impaction.  Reports history of previous dilation.  I am able to retrieve an EGD and colonoscopy report from 2008 in provation done by Dr. Allen Norris for dysphagia and diarrhea.  Food was seen in the stomach and gastric emptying study was recommended.  Esophagus was reported to be normal.  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  Pathology report not available.  I am unable to find any other procedures reports were dilation were done in the past.  Patient did have delayed gastric emptying in 2008  Past Medical History:  Diagnosis Date  . Anxiety   . ED (erectile dysfunction)   . Elevated PSA   . Heartburn   . History of BPH   . HLD (hyperlipidemia)   . HTN (hypertension)   . Pneumonia     Past Surgical History:  Procedure Laterality Date  . APPENDECTOMY  2005  . CERVICAL SPINE SURGERY  1997   c4-c7  . CHOLECYSTECTOMY  2005  . TONSILLECTOMY      Prior to Admission medications   Medication Sig Start Date End Date Taking? Authorizing Provider  amLODipine (NORVASC) 10 MG tablet Take 10 mg by mouth daily.    [provider]  aspirin 81 MG chewable tablet Chew by mouth daily.    [provider]  cyclobenzaprine (FLEXERIL) 10 MG tablet Take 10 mg by mouth daily.    [provider]  enalapril (VASOTEC) 5 MG tablet Take 5 mg by mouth daily.    [provider]  gabapentin (NEURONTIN) 300 MG capsule Take 300 mg by mouth 3 (three) times daily.    [provider]  metoprolol succinate (TOPROL-XL) 50 MG 24 hr tablet Take 50 mg by mouth daily. Take with or immediately following a meal.    [provider]  sildenafil (REVATIO) 20 MG tablet Take 3 to 5 tablets two hours before intercouse on an empty stomach.  Do not take with nitrates. 09/04/18   [name], PA-C    Family History  Problem Relation Age of Onset  . Kidney cancer Maternal Grandmother   . Stroke Father   . Hypertension Mother   . Diabetes Mother   . Osteoarthritis Mother   . Osteoporosis Mother   . Kidney disease Neg Hx   . Prostate cancer Neg Hx   . Bladder Cancer Neg Hx      Social History   Tobacco Use  . Smoking status: Former Smoker    Years: 14.00    Types: Cigarettes    Quit date: 03/05/2004    Years since quitting: 16.7  . Smokeless tobacco: Current User    Types: Chew  Vaping Use  . Vaping Use: Never used  Substance Use Topics  . Alcohol use: Yes    Alcohol/week: 1.0 standard drink  Types: 1 Cans of beer per week    Comment: occaionally  . Drug use: No    Allergies as of 12/17/2020  . (No Known Allergies)    Review of Systems:    All systems reviewed and negative except where noted in HPI.   Physical Exam:   Vitals:   12/17/20 1408  BP: 137/76  Pulse: 72  Temp: 98.1 F (36.7 C)  TempSrc: Oral  Weight: 188 lb 9.6 oz (85.5 kg)  Height: 5\' 10"  (1.778 m)   No LMP for male patient. Psych:  Alert and cooperative. Normal mood and affect. General:   Alert,  Well-developed, well-nourished, pleasant and cooperative in NAD Head:  Normocephalic and atraumatic. Eyes:  Sclera clear, no icterus.   Conjunctiva pink. Ears:  Normal auditory acuity. Nose:  No deformity, discharge, or lesions. Mouth:  No deformity or lesions,oropharynx pink & moist. Neck:  Supple; no masses or thyromegaly. Abdomen:  Normal bowel sounds.   No bruits.  Soft, non-tender and non-distended without masses, hepatosplenomegaly or hernias noted.  No guarding or rebound tenderness.    Msk:  Symmetrical without gross deformities. Good, equal movement & strength bilaterally. Pulses:  Normal pulses noted. Extremities:  No clubbing or edema.  No cyanosis. Neurologic:  Alert and oriented x3;  grossly normal neurologically. Skin:  Intact without significant lesions or rashes. No jaundice. Lymph Nodes:  No significant cervical adenopathy. Psych:  Alert and cooperative. Normal mood and affect.   Labs: CBC    Component Value Date/Time   WBC 10.6 03/14/2018 1148   RBC 4.92 03/14/2018 1148   HGB 16.1 03/14/2018 1148   HGB 14.7 04/20/2014 1206   HCT 45.5 03/14/2018 1148   HCT 43.8 04/20/2014 1206   PLT 232 03/14/2018 1148   PLT 142 (L) 04/20/2014 1206   MCV 92.5 03/14/2018 1148   MCV 87 04/20/2014 1206   MCH 32.7 03/14/2018 1148   MCHC 35.3 03/14/2018 1148   RDW 14.1 03/14/2018 1148   RDW 13.6 04/20/2014 1206   LYMPHSABS 1.8 04/20/2014 1206   MONOABS 0.6 04/20/2014 1206   EOSABS 0.1 04/20/2014 1206   BASOSABS 0.0 04/20/2014 1206   CMP     Component Value Date/Time   NA 136 03/14/2018 1148   NA 134 (L) 04/20/2014 1206   K 4.4 03/14/2018 1148   K 4.0 04/20/2014 1206   CL 105 03/14/2018 1148   CL 100 04/20/2014 1206   CO2 22 03/14/2018 1148   CO2 30 04/20/2014 1206   GLUCOSE 81 03/14/2018 1148   GLUCOSE 121 (H) 04/20/2014 1206   BUN 14 03/14/2018 1148   BUN 12 04/20/2014 1206   CREATININE 1.29 (H) 03/14/2018 1148   CREATININE 0.98 04/20/2014 1206   CALCIUM 9.4 03/14/2018 1148   CALCIUM 8.9 04/20/2014 1206   GFRNONAA 60 (L) 03/14/2018 1148   GFRNONAA >60 04/20/2014 1206   GFRAA >60 03/14/2018 1148   GFRAA >60 04/20/2014 1206    Imaging Studies: No results found.  Assessment and Plan:   [name] is a 60 y.o. y/o male has been referred for dysphagia  Due to solid food dysphagia and patient's report of  previous dilation, this could be occurring from possible Schatzki's ring or other reasons for obstruction  EGD indicated for further evaluation.  Would recommend not having any solid foods for 24 hours prior to his upper endoscopy given delayed gastric emptying in 2008 and food seen in stomach during the endoscopy  Small frequent meals recommended  Patient is  also due for screening colonoscopy  Alternative options of conservative management were discussed in detail, including but not limited to medication management, foregoing endoscopic procedures at this time and others.    I have discussed alternative options, risks & benefits,  which include, but are not limited to, bleeding, infection, perforation,respiratory complication & drug reaction.  The patient agrees with this plan & written consent will be obtained.       Dr Kevin Esparza  Speech recognition software was used to dictate the above note.

## 2020-12-29 ENCOUNTER — Other Ambulatory Visit: Payer: Self-pay

## 2020-12-29 ENCOUNTER — Other Ambulatory Visit
Admission: RE | Admit: 2020-12-29 | Discharge: 2020-12-29 | Disposition: A | Discharge status: Home / Self Care | Payer: Medicare Other | Source: Ambulatory Visit | Attending: Gastroenterology | Admitting: Gastroenterology

## 2020-12-29 DIAGNOSIS — Z20822 Contact with and (suspected) exposure to covid-19: Secondary | ICD-10-CM | POA: Diagnosis not present

## 2020-12-29 DIAGNOSIS — Z01812 Encounter for preprocedural laboratory examination: Secondary | ICD-10-CM | POA: Diagnosis present

## 2020-12-30 ENCOUNTER — Encounter: Payer: Self-pay | Admitting: Gastroenterology

## 2020-12-30 LAB — SARS CORONAVIRUS 2 (TAT 6-24 HRS): SARS Coronavirus 2: NEGATIVE

## 2020-12-31 ENCOUNTER — Telehealth: Payer: Self-pay

## 2020-12-31 ENCOUNTER — Ambulatory Visit: Payer: Medicare Other | Admitting: Registered Nurse

## 2020-12-31 ENCOUNTER — Ambulatory Visit
Admission: RE | Admit: 2020-12-31 | Discharge: 2020-12-31 | Disposition: A | Discharge status: Home / Self Care | Payer: Medicare Other | Attending: Gastroenterology | Admitting: Gastroenterology

## 2020-12-31 ENCOUNTER — Encounter: Payer: Self-pay | Admitting: Gastroenterology

## 2020-12-31 ENCOUNTER — Encounter
Admission: RE | Disposition: A | Discharge status: Home / Self Care | Payer: Self-pay | Source: Home / Self Care | Attending: Gastroenterology

## 2020-12-31 ENCOUNTER — Other Ambulatory Visit: Payer: Self-pay

## 2020-12-31 DIAGNOSIS — Z79899 Other long term (current) drug therapy: Secondary | ICD-10-CM | POA: Insufficient documentation

## 2020-12-31 DIAGNOSIS — D122 Benign neoplasm of ascending colon: Secondary | ICD-10-CM

## 2020-12-31 DIAGNOSIS — K222 Esophageal obstruction: Secondary | ICD-10-CM | POA: Diagnosis not present

## 2020-12-31 DIAGNOSIS — C7A8 Other malignant neuroendocrine tumors: Secondary | ICD-10-CM | POA: Insufficient documentation

## 2020-12-31 DIAGNOSIS — R1310 Dysphagia, unspecified: Secondary | ICD-10-CM | POA: Insufficient documentation

## 2020-12-31 DIAGNOSIS — D124 Benign neoplasm of descending colon: Secondary | ICD-10-CM | POA: Diagnosis not present

## 2020-12-31 DIAGNOSIS — Z1211 Encounter for screening for malignant neoplasm of colon: Secondary | ICD-10-CM | POA: Insufficient documentation

## 2020-12-31 DIAGNOSIS — K2950 Unspecified chronic gastritis without bleeding: Secondary | ICD-10-CM | POA: Insufficient documentation

## 2020-12-31 DIAGNOSIS — Z7984 Long term (current) use of oral hypoglycemic drugs: Secondary | ICD-10-CM | POA: Diagnosis not present

## 2020-12-31 DIAGNOSIS — Z87891 Personal history of nicotine dependence: Secondary | ICD-10-CM | POA: Diagnosis not present

## 2020-12-31 DIAGNOSIS — D123 Benign neoplasm of transverse colon: Secondary | ICD-10-CM | POA: Diagnosis not present

## 2020-12-31 DIAGNOSIS — K635 Polyp of colon: Secondary | ICD-10-CM

## 2020-12-31 DIAGNOSIS — K648 Other hemorrhoids: Secondary | ICD-10-CM | POA: Insufficient documentation

## 2020-12-31 DIAGNOSIS — R1319 Other dysphagia: Secondary | ICD-10-CM

## 2020-12-31 DIAGNOSIS — Z7982 Long term (current) use of aspirin: Secondary | ICD-10-CM | POA: Insufficient documentation

## 2020-12-31 DIAGNOSIS — K3189 Other diseases of stomach and duodenum: Secondary | ICD-10-CM | POA: Diagnosis not present

## 2020-12-31 HISTORY — DX: Type 2 diabetes mellitus without complications: E11.9

## 2020-12-31 HISTORY — PX: COLONOSCOPY WITH PROPOFOL: SHX5780

## 2020-12-31 HISTORY — PX: ESOPHAGOGASTRODUODENOSCOPY (EGD) WITH PROPOFOL: SHX5813

## 2020-12-31 LAB — GLUCOSE, CAPILLARY: Glucose-Capillary: 137 mg/dL — ABNORMAL HIGH (ref 70–99)

## 2020-12-31 SURGERY — ESOPHAGOGASTRODUODENOSCOPY (EGD) WITH PROPOFOL
Anesthesia: General

## 2020-12-31 MED ORDER — PROPOFOL 500 MG/50ML IV EMUL
INTRAVENOUS | Status: AC
  Filled 2020-12-31: qty 50

## 2020-12-31 MED ORDER — PROPOFOL 500 MG/50ML IV EMUL
INTRAVENOUS | Status: DC | PRN
  Administered 2020-12-31: 175 ug/kg/min via INTRAVENOUS

## 2020-12-31 MED ORDER — PROPOFOL 10 MG/ML IV BOLUS
INTRAVENOUS | Status: DC | PRN
  Administered 2020-12-31: 80 mg via INTRAVENOUS
  Administered 2020-12-31: 20 mg via INTRAVENOUS

## 2020-12-31 MED ORDER — GLYCOPYRROLATE 0.2 MG/ML IJ SOLN
INTRAMUSCULAR | Status: DC | PRN
  Administered 2020-12-31: .2 mg via INTRAVENOUS

## 2020-12-31 MED ORDER — OMEPRAZOLE 20 MG PO CPDR: 20.0000 mg | DELAYED_RELEASE_CAPSULE | Freq: Every day | ORAL | 0 refills | Status: DC

## 2020-12-31 MED ORDER — SODIUM CHLORIDE 0.9 % IV SOLN: INTRAVENOUS | Status: DC

## 2020-12-31 NOTE — Anesthesia Postprocedure Evaluation (Signed)
Anesthesia Post Note  Patient: Kevin Esparza  Procedure(s) Performed: ESOPHAGOGASTRODUODENOSCOPY (EGD) WITH PROPOFOL (N/A ) COLONOSCOPY WITH PROPOFOL (N/A )  Patient location during evaluation: Endoscopy Anesthesia Type: General Level of consciousness: awake and alert Pain management: pain level controlled Vital Signs Assessment: post-procedure vital signs reviewed and stable Respiratory status: spontaneous breathing, nonlabored ventilation, respiratory function stable and patient connected to nasal cannula oxygen Cardiovascular status: blood pressure returned to baseline and stable Postop Assessment: no apparent nausea or vomiting Anesthetic complications: no   No complications documented.   Last Vitals:  Vitals:   12/31/20 1116 12/31/20 1125  BP: 132/71 125/81  Pulse: (!) 58 (!) 57  Resp: 15 14  Temp:    SpO2: 100% 100%    Last Pain:  Vitals:   12/31/20 1125  TempSrc:   PainSc: 0-No pain                 Martha Clan

## 2020-12-31 NOTE — Telephone Encounter (Signed)
Referral was sent to Dr. Donneta Romberg office. They will contact the patient to schedule appointment date and time.

## 2020-12-31 NOTE — Anesthesia Preprocedure Evaluation (Addendum)
Anesthesia Evaluation  Patient identified by MRN, date of birth, ID band Patient awake    Reviewed: Allergy & Precautions, H&P , NPO status , Patient's Chart, lab work & pertinent test results, reviewed documented beta blocker date and time   History of Anesthesia Complications Negative for: history of anesthetic complications  Airway Mallampati: I  TM Distance: >3 FB Neck ROM: full    Dental  (+) Dental Advidsory Given, Edentulous Upper, Edentulous Lower   Pulmonary neg pulmonary ROS, former smoker,    Pulmonary exam normal breath sounds clear to auscultation       Cardiovascular Exercise Tolerance: Good hypertension, (-) angina(-) Past MI and (-) Cardiac Stents Normal cardiovascular exam(-) dysrhythmias (-) Valvular Problems/Murmurs Rhythm:regular Rate:Normal     Neuro/Psych PSYCHIATRIC DISORDERS Anxiety negative neurological ROS     GI/Hepatic negative GI ROS, Neg liver ROS,   Endo/Other  diabetes  Renal/GU negative Renal ROS  negative genitourinary   Musculoskeletal   Abdominal   Peds  Hematology negative hematology ROS (+)   Anesthesia Other Findings Past Medical History: No date: Anxiety No date: Diabetes mellitus without complication (HCC) No date: ED (erectile dysfunction) No date: Elevated PSA No date: Heartburn No date: History of BPH No date: HLD (hyperlipidemia) No date: HTN (hypertension) No date: Pneumonia   Reproductive/Obstetrics negative OB ROS                            Anesthesia Physical Anesthesia Plan  ASA: II  Anesthesia Plan: General   Post-op Pain Management:    Induction: Intravenous  PONV Risk Score and Plan: TIVA and Propofol infusion  Airway Management Planned: Natural Airway and Nasal Cannula  Additional Equipment:   Intra-op Plan:   Post-operative Plan:   Informed Consent: I have reviewed the patients History and Physical, chart, labs  and discussed the procedure including the risks, benefits and alternatives for the proposed anesthesia with the patient or authorized representative who has indicated his/her understanding and acceptance.     Dental Advisory Given  Plan Discussed with: Anesthesiologist, CRNA and Surgeon  Anesthesia Plan Comments:         Anesthesia Quick Evaluation

## 2020-12-31 NOTE — Op Note (Addendum)
[facility] Gastroenterology Patient Name: Noelle Hoogland Procedure Date: 12/31/2020 9:10 AM MRN: [id number] Account #: [id number] Date of Birth: 07/11/1961 Admit Type: Outpatient Age: 60 Room: [facility] ENDO ROOM 1 Gender: Male Note Status: Finalized Procedure:             Colonoscopy Indications:           Screening for colorectal malignant neoplasm Providers:             Varnita B. Bonna Gains MD, MD Referring MD:          Forest Gleason Md, MD (Referring MD) Medicines:             Monitored Anesthesia Care Complications:         No immediate complications. Procedure:             Pre-Anesthesia Assessment:                        - ASA Grade Assessment: II - A patient with mild                         systemic disease.                        - Prior to the procedure, a History and Physical was                         performed, and patient medications, allergies and                         sensitivities were reviewed. The patient's tolerance                         of previous anesthesia was reviewed.                        - The risks and benefits of the procedure and the                         sedation options and risks were discussed with the                         patient. All questions were answered and informed                         consent was obtained.                        - Patient identification and proposed procedure were                         verified prior to the procedure by the physician, the                         nurse, the anesthesiologist, the anesthetist and the                         technician. The procedure was verified in the                         procedure  room.                        After obtaining informed consent, the colonoscope was                         passed under direct vision. Throughout the procedure,                         the patient's blood pressure, pulse, and oxygen                         saturations were monitored  continuously. The                         Colonoscope was introduced through the anus and                         advanced to the the terminal ileum. The colonoscopy                         was performed with ease. The patient tolerated the                         procedure well. The quality of the bowel preparation                         was good except the sigmoid colon was fair. Findings:      The perianal and digital rectal examinations were normal.      A 20 mm polyp was found in the cecum. The polyp was flat. Area was       successfully injected with 9 mL Eleview for a lift polypectomy.       Polypectomy was not attempted due to poor endoscopic visualization.      A 12 mm polyp was found in the ascending colon. The polyp was flat. Area       was successfully injected with 1 mL Eleview for lesion assessment, and       this injection appeared to lift the lesion adequately. The polyp was       removed with a lift and cut technique using a hot snare. Resection and       retrieval were complete. To prevent bleeding after the polypectomy, one       hemostatic clip was successfully placed. There was no bleeding at the       end of the procedure.      A 12 mm polyp was found in the transverse colon. The polyp was flat.       Area was successfully injected with 5 mL saline for lesion assessment,       and this injection appeared to lift the lesion adequately. The polyp was       removed with a saline injection-lift technique using a hot snare.       Resection and retrieval were complete.      A 10 mm polyp was found in the descending colon. The polyp was flat.       Polypectomy was attempted, initially using a cold snare. Polyp resection       was incomplete with this device. This intervention then required a  different device and polypectomy technique. The polyp was removed with a       jumbo cold forceps. Resection and retrieval were complete.      Five flat and sessile polyps were  found in the sigmoid colon and       descending colon. The polyps were 5 to 8 mm in size. These polyps were       removed with a cold snare. Resection and retrieval were complete.      Non-bleeding internal hemorrhoids were found during retroflexion.      The exam was otherwise without abnormality. Impression:            - One 20 mm polyp in the cecum. Resection not                         attempted. Injected.                        - One 12 mm polyp in the ascending colon, removed                         using lift and cut and a hot snare. Resected and                         retrieved. Injected. Clip was placed.                        - One 12 mm polyp in the transverse colon, removed                         using injection-lift and a hot snare. Resected and                         retrieved. Injected.                        - One 10 mm polyp in the descending colon, removed                         with a jumbo cold forceps. Resected and retrieved.                        - Five 5 to 8 mm polyps in the sigmoid colon and in                         the descending colon, removed with a cold snare.                         Resected and retrieved.                        - Non-bleeding internal hemorrhoids.                        - The examination was otherwise normal. Recommendation:        - Refer to a gastroenterologist, Dr. Rush Landmark, for                         removal of cecal polyp.                        -  Discharge patient to home (with escort).                        - Advance diet as tolerated.                        - Continue present medications.                        - Await pathology results.                        - The findings and recommendations were discussed with                         the patient.                        - The findings and recommendations were discussed with                         the patient's family.                        - Return to primary care  physician as previously                         scheduled.                        - High fiber diet. Procedure Code(s):     --- Professional ---                        [phone], Colonoscopy, flexible; with removal of                         tumor(s), polyp(s), or other lesion(s) by snare                         technique                        45381, Colonoscopy, flexible; with directed submucosal                         injection(s), any substance                        70623, 62, Colonoscopy, flexible; with biopsy, single                         or multiple Diagnosis Code(s):     --- Professional ---                        K63.5, Polyp of colon                        Z12.11, Encounter for screening for malignant neoplasm                         of colon CPT copyright 2019 [employer] Association. All rights reserved. The codes documented in this report  are preliminary and upon coder review may  be revised to meet current compliance requirements.  [name], MD Margretta Sidle B. Bonna Gains MD, MD 12/31/2020 11:07:44 AM This report has been signed electronically. Number of Addenda: 0 Note Initiated On: 12/31/2020 9:10 AM Scope Withdrawal Time: 0 hours 49 minutes 31 seconds  Total Procedure Duration: 0 hours 56 minutes 3 seconds  Estimated Blood Loss:  Estimated blood loss: none.      [facility]

## 2020-12-31 NOTE — Anesthesia Procedure Notes (Signed)
Date/Time: 12/31/2020 9:24 AM Performed by: Doreen Salvage, CRNA Pre-anesthesia Checklist: Patient identified, Emergency Drugs available, Suction available and Patient being monitored Patient Re-evaluated:Patient Re-evaluated prior to induction Oxygen Delivery Method: Nasal cannula Induction Type: IV induction Dental Injury: Teeth and Oropharynx as per pre-operative assessment  Comments: Nasal cannula with etCO2 monitoring

## 2020-12-31 NOTE — Transfer of Care (Signed)
Immediate Anesthesia Transfer of Care Note  Patient: Kevin Esparza  Procedure(s) Performed: Procedure(s): ESOPHAGOGASTRODUODENOSCOPY (EGD) WITH PROPOFOL (N/A) COLONOSCOPY WITH PROPOFOL (N/A)  Patient Location: PACU and Endoscopy Unit  Anesthesia Type:General  Level of Consciousness: sedated  Airway & Oxygen Therapy: Patient Spontanous Breathing and Patient connected to nasal cannula oxygen  Post-op Assessment: Report given to RN and Post -op Vital signs reviewed and stable  Post vital signs: Reviewed and stable  Last Vitals:  Vitals:   12/31/20 0835 12/31/20 1056  BP: (!) 141/92 118/80  Pulse: 64   Resp: 17 11  Temp: 36.5 C 36.6 C  SpO2: 26% 33%    Complications: No apparent anesthesia complications

## 2020-12-31 NOTE — Brief Op Note (Signed)
Cecal polyp lifted with [place] but not removed

## 2020-12-31 NOTE — Op Note (Signed)
[facility]astroenterology Patient Name: Kevin Esparza Procedure Date: 12/31/2020 9:10 AM MRN: [id number] Account #: [id number] Date of Birth: 1961/04/24 Admit Type: Outpatient Age: 60 Room: [facility] ENDO ROOM 1 Gender: Male Note Status: Finalized Procedure:             Upper GI endoscopy Indications:           Dysphagia Providers:             Jaesean Litzau B. Bonna Gains MD, MD Referring MD:          Forest Gleason Md, MD (Referring MD) Medicines:             Monitored Anesthesia Care Complications:         No immediate complications. Procedure:             Pre-Anesthesia Assessment:                        - Prior to the procedure, a History and Physical was                         performed, and patient medications, allergies and                         sensitivities were reviewed. The patient's tolerance                         of previous anesthesia was reviewed.                        - The risks and benefits of the procedure and the                         sedation options and risks were discussed with the                         patient. All questions were answered and informed                         consent was obtained.                        - Patient identification and proposed procedure were                         verified prior to the procedure by the physician, the                         nurse, the anesthesiologist, the anesthetist and the                         technician. The procedure was verified in the                         procedure room.                        - ASA Grade Assessment: II - A patient with mild                         systemic disease.  After obtaining informed consent, the endoscope was                         passed under direct vision. Throughout the procedure,                         the patient's blood pressure, pulse, and oxygen                         saturations were monitored continuously. The Endoscope                          was introduced through the mouth, and advanced to the                         second part of duodenum. The upper GI endoscopy was                         accomplished with ease. The patient tolerated the                         procedure well. Findings:      A mild Schatzki ring was found in the distal esophagus. A TTS dilator       was passed through the scope. Dilation with an 18-19-20 mm balloon       dilator was performed to 18 mm. The dilation site was examined and       showed complete resolution of luminal narrowing. Expected and good heme       effect noted after dilation to 7mm      The exam of the esophagus was otherwise normal.      Patchy mildly erythematous mucosa without bleeding was found in the       gastric antrum. Biopsies were taken with a cold forceps for histology.       Biopsies were obtained in the gastric body, at the incisura and in the       gastric antrum with cold forceps for histology.      A single 8 mm submucosal papule (nodule) with no stigmata of recent       bleeding was found at the incisura. Biopsies were taken with a cold       forceps for histology. This appeared to be a benign lesion.      Nodular mucosa was found on the lesser curvature of the gastric antrum.       Biopsies were taken with a cold forceps for histology. Imaging was       performed using narrow band imaging to visualize the mucosa.      A single 6 mm mucosal papule (nodule) was found on the lesser curvature       of the stomach. Biopsies were taken with a cold forceps for histology.       Imaging was performed using narrow band imaging to visualize the mucosa.      The exam of the stomach was otherwise normal.      The duodenal bulb, second portion of the duodenum and examined duodenum       were normal. Impression:            - Mild Schatzki ring. Dilated.                        -  Erythematous mucosa in the antrum. Biopsied.                        - A single  submucosal papule (nodule) found in the                         stomach. Biopsied.                        - Nodular mucosa in the gastric antrum (lesser                         curvature). Biopsied.                        - A single mucosal papule (nodule) found in the                         stomach. Biopsied.                        - Normal duodenal bulb, second portion of the duodenum                         and examined duodenum.                        - Biopsies were obtained in the gastric body, at the                         incisura and in the gastric antrum. Recommendation:        - Take prescribed proton pump inhibitor or H2 blocker                         (antacid) medications 30 - 60 minutes before meals.                        - Await pathology results.                        - Discharge patient to home (with escort).                        - Advance diet as tolerated.                        - Continue present medications.                        - Patient has a contact number available for                         emergencies. The signs and symptoms of potential                         delayed complications were discussed with the patient.                         Return to normal activities tomorrow. Written  discharge instructions were provided to the patient.                        - Discharge patient to home (with escort).                        - The findings and recommendations were discussed with                         the patient.                        - The findings and recommendations were discussed with                         the patient's family. Procedure Code(s):     --- Professional ---                        [phone], Esophagogastroduodenoscopy, flexible,                         transoral; with transendoscopic balloon dilation of                         esophagus (less than 30 mm diameter)                        43239, 59,  Esophagogastroduodenoscopy, flexible,                         transoral; with biopsy, single or multiple Diagnosis Code(s):     --- Professional ---                        K22.2, Esophageal obstruction                        K31.89, Other diseases of stomach and duodenum                        R13.10, Dysphagia, unspecified CPT copyright 2019 American Medical Association. All rights reserved. The codes documented in this report are preliminary and upon coder review may  be revised to meet current compliance requirements.  [name], MD Margretta Sidle B. Bonna Gains MD, MD 12/31/2020 9:50:10 AM This report has been signed electronically. Number of Addenda: 0 Note Initiated On: 12/31/2020 9:10 AM Estimated Blood Loss:  Estimated blood loss: none.      [facility]

## 2020-12-31 NOTE — H&P (Signed)
[name], MD [address] [place], [place], [place], [address] [address], [place], [place], [place], [address] Phone: [phone]  Fax: [phone]  Primary Care Physician:  Ranae Plumber, [place]   Pre-Procedure History & Physical: HPI:  Kevin Esparza is a 60 y.o. male is here for a colonoscopy and EGD.   Past Medical History:  Diagnosis Date  . Anxiety   . Diabetes mellitus without complication ([place])   . ED (erectile dysfunction)   . Elevated PSA   . Heartburn   . History of BPH   . HLD (hyperlipidemia)   . HTN (hypertension)   . Pneumonia     Past Surgical History:  Procedure Laterality Date  . APPENDECTOMY  2005  . CERVICAL SPINE SURGERY  1997   c4-c7  . CHOLECYSTECTOMY  2005  . TONSILLECTOMY      Prior to Admission medications   Medication Sig Start Date End Date Taking? Authorizing Provider  amLODipine (NORVASC) 10 MG tablet Take 10 mg by mouth daily.   Yes [provider]  aspirin 81 MG chewable tablet Chew by mouth daily.   Yes [provider]  enalapril (VASOTEC) 5 MG tablet Take 5 mg by mouth daily.   Yes [provider]  gabapentin (NEURONTIN) 300 MG capsule Take 300 mg by mouth 3 (three) times daily.   Yes [provider]  metFORMIN (GLUCOPHAGE) 1000 MG tablet Take 1,000 mg by mouth 2 (two) times daily with a meal.   Yes [provider]  metoprolol succinate (TOPROL-XL) 50 MG 24 hr tablet Take 50 mg by mouth daily. Take with or immediately following a meal.   Yes [provider]  cyclobenzaprine (FLEXERIL) 10 MG tablet Take 10 mg by mouth daily.    [provider]  Na Sulfate-K Sulfate-Mg Sulf 17.5-3.13-1.6 GM/177ML SOLN At 5 PM the day before procedure take 1 bottle and 5 hours before procedure take 1 bottle. 12/17/20   Virgel Manifold, MD  sildenafil (REVATIO) 20 MG tablet Take 3 to 5 tablets two hours before intercouse on an empty stomach.  Do not take with  nitrates. Patient not taking: No sig reported 09/04/18   Zara Council A, PA-C    Allergies as of 12/18/2020  . (No Known Allergies)    Family History  Problem Relation Age of Onset  . Kidney cancer Maternal Grandmother   . Stroke Father   . Hypertension Mother   . Diabetes Mother   . Osteoarthritis Mother   . Osteoporosis Mother   . Kidney disease Neg Hx   . Prostate cancer Neg Hx   . Bladder Cancer Neg Hx     Social History   Socioeconomic History  . Marital status: Married    Spouse name: Not on file  . Number of children: Not on file  . Years of education: Not on file  . Highest education level: Not on file  Occupational History  . Not on file  Tobacco Use  . Smoking status: Former Smoker    Years: 14.00    Types: Cigarettes    Quit date: 03/05/2004    Years since quitting: 16.8  . Smokeless tobacco: Current User    Types: Chew  Vaping Use  . Vaping Use: Never used  Substance and Sexual Activity  . Alcohol use: Yes    Alcohol/week: 1.0 standard drink    Types: 1 Cans of beer per week    Comment: occaionally  . Drug use: No  . Sexual  activity: Not on file  Other Topics Concern  . Not on file  Social History Narrative  . Not on file   Social Determinants of Health   Financial Resource Strain: Not on file  Food Insecurity: Not on file  Transportation Needs: Not on file  Physical Activity: Not on file  Stress: Not on file  Social Connections: Not on file  Intimate Partner Violence: Not on file    Review of Systems: See HPI, otherwise negative ROS  Physical Exam: BP (!) 141/92   Pulse 64   Temp 97.7 F (36.5 C) (Temporal)   Resp 17   Ht 5\' 10"  (1.778 m)   Wt 83 kg   SpO2 97%   BMI 26.26 kg/m  General:   Alert,  pleasant and cooperative in NAD Head:  Normocephalic and atraumatic. Neck:  Supple; no masses or thyromegaly. Lungs:  Clear throughout to auscultation, normal respiratory effort.    Heart:  +S1, +S2, Regular rate and rhythm, No  edema. Abdomen:  Soft, nontender and nondistended. Normal bowel sounds, without guarding, and without rebound.   Neurologic:  Alert and  oriented x4;  grossly normal neurologically.  Impression/Plan: Kevin Esparza is here for a colonoscopy to be performed for average risk screening and EGD for dysphagia  Risks, benefits, limitations, and alternatives regarding the procedures have been reviewed with the patient.  Questions have been answered.  All parties agreeable.   Virgel Manifold, MD  12/31/2020, 9:14 AM

## 2021-01-01 ENCOUNTER — Encounter: Payer: Self-pay | Admitting: Gastroenterology

## 2021-01-02 ENCOUNTER — Other Ambulatory Visit: Payer: Self-pay | Admitting: Pathology

## 2021-01-05 ENCOUNTER — Telehealth: Payer: Self-pay | Admitting: Gastroenterology

## 2021-01-05 LAB — SURGICAL PATHOLOGY

## 2021-01-05 NOTE — Telephone Encounter (Signed)
Patient called asking about his follow up appointment after his procedure and his referral for removal of Cecal Polyp. Please call to advise

## 2021-01-05 NOTE — Telephone Encounter (Signed)
Called patienty back to let him know that I reached out to Dr. Fannie Knee nurse to ask when the patient could be seen and now I am waiting from a response from her. Once I do, I will let the patient know. However, patient wanted to know if he could be referred to [facility] to have his polyp be remove there. Dr. Bonna Gains, can you please advise?

## 2021-01-07 ENCOUNTER — Ambulatory Visit (INDEPENDENT_AMBULATORY_CARE_PROVIDER_SITE_OTHER): Payer: Medicare Other | Admitting: Gastroenterology

## 2021-01-07 ENCOUNTER — Other Ambulatory Visit: Payer: Self-pay

## 2021-01-07 ENCOUNTER — Encounter: Payer: Self-pay | Admitting: Gastroenterology

## 2021-01-07 VITALS — BP 135/80 | HR 67 | Temp 97.7°F | Ht 70.0 in | Wt 185.0 lb

## 2021-01-07 DIAGNOSIS — K635 Polyp of colon: Secondary | ICD-10-CM | POA: Diagnosis not present

## 2021-01-07 DIAGNOSIS — R1310 Dysphagia, unspecified: Secondary | ICD-10-CM | POA: Diagnosis not present

## 2021-01-07 DIAGNOSIS — D3A8 Other benign neuroendocrine tumors: Secondary | ICD-10-CM | POA: Diagnosis not present

## 2021-01-07 DIAGNOSIS — D449 Neoplasm of uncertain behavior of unspecified endocrine gland: Secondary | ICD-10-CM

## 2021-01-07 NOTE — Patient Instructions (Signed)
We will send a referral to Dr. Lilia Argue and they will call you to schedule you an appointment.  We will also refer you to see dr. Tasia Catchings at the [facility]. They should call you within a week to schedule you an appointment date and time.

## 2021-01-08 ENCOUNTER — Other Ambulatory Visit: Payer: Medicare Other

## 2021-01-08 NOTE — Progress Notes (Signed)
Kevin Antigua, MD [address]  [place]  [place], [place] [address]  Main: [phone]  Fax: [phone]   Primary Care Physician: Ranae Plumber, [place]   CC: colon polyp  HPI: Kevin Esparza is a 60 y.o. male who initially saw [place] due to dysphagia and underwent EGD and colonoscopy for screening.  Patient here for follow-up.  EGD showed a Schatzki's ring that was dilated to 18 mm.  A nodule was seen on the lesser curvature of the stomach.  Another area of nodular mucosa is also seen the lesser curvature of the antrum.  Gastric erythema was noted.  Colonoscopy showed several polyps, 1 of which in the cecum was not removed due to poor visualization despite submucosal lift with injection.  Several other polyps were seen and removed as detailed in procedure report.  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   Current Outpatient Medications  Medication Sig Dispense Refill  . amLODipine (NORVASC) 10 MG tablet Take 10 mg by mouth daily.    Marland Kitchen aspirin 81 MG chewable tablet Chew by mouth daily.    . cyclobenzaprine (FLEXERIL) 10 MG tablet Take 10 mg by mouth daily.    . enalapril (VASOTEC) 5 MG tablet Take 5 mg by mouth daily.    Marland Kitchen gabapentin (NEURONTIN) 300 MG capsule Take 300 mg by mouth 3 (three) times daily.    . metFORMIN (GLUCOPHAGE) 1000 MG tablet Take 1,000 mg by mouth 2 (two) times daily with a meal.    . metoprolol succinate (TOPROL-XL) 50 MG 24 hr tablet Take 50 mg by mouth daily. Take with or immediately following a meal.    . Na Sulfate-K Sulfate-Mg Sulf 17.5-3.13-1.6 GM/177ML SOLN At 5 PM the day before procedure take 1 bottle and 5 hours before procedure take 1 bottle. 354 mL 0  . omeprazole  (PRILOSEC) 20 MG capsule Take 1 capsule (20 mg total) by mouth daily. 30 capsule 0  . sildenafil (REVATIO) 20 MG tablet Take 3 to 5 tablets two hours before intercouse on an empty stomach.  Do not take with nitrates. (Patient taking differently: Take 3 to 5 tablets two hours before intercouse on an empty stomach.  Do not take with nitrates.) 50 tablet 3   No current facility-administered medications for this visit.    Allergies as of 01/07/2021  . (No Known Allergies)    ROS:  General: Negative for anorexia, weight loss, fever, chills, fatigue, weakness. ENT: Negative for hoarseness, difficulty swallowing , nasal congestion. CV: Negative for chest pain, angina, palpitations, dyspnea on exertion, peripheral edema.  Respiratory: Negative for dyspnea at rest, dyspnea on exertion, cough, sputum, wheezing.  GI: See history of present illness. GU:  Negative for dysuria, hematuria, urinary incontinence, urinary frequency, nocturnal urination.  Endo: Negative for unusual weight change.    Physical Examination:   BP 135/80   Pulse 67   Temp 97.7 F (36.5 C) (Oral)   Ht 5\' 10"  (1.778 m)   Wt 185 lb (83.9 kg)   BMI 26.54 kg/m   General: Well-nourished, well-developed in no acute distress.  Eyes: No icterus. Conjunctivae pink. Mouth: Oropharyngeal mucosa moist and pink , no lesions erythema or exudate. Neck: Supple, Trachea midline Abdomen: Bowel sounds are normal, nontender, nondistended, no hepatosplenomegaly  or masses, no abdominal bruits or hernia , no rebound or guarding.   Extremities: No lower extremity edema. No clubbing or deformities. Neuro: Alert and oriented x 3.  Grossly intact. Skin: Warm and dry, no jaundice.   Psych: Alert and cooperative, normal mood and affect.   Labs: CMP     Component Value Date/Time   NA 136 03/14/2018 1148   NA 134 (L) 04/20/2014 1206   K 4.4 03/14/2018 1148   K 4.0 04/20/2014 1206   CL 105 03/14/2018 1148   CL 100 04/20/2014 1206   CO2  22 03/14/2018 1148   CO2 30 04/20/2014 1206   GLUCOSE 81 03/14/2018 1148   GLUCOSE 121 (H) 04/20/2014 1206   BUN 14 03/14/2018 1148   BUN 12 04/20/2014 1206   CREATININE 1.29 (H) 03/14/2018 1148   CREATININE 0.98 04/20/2014 1206   CALCIUM 9.4 03/14/2018 1148   CALCIUM 8.9 04/20/2014 1206   GFRNONAA 60 (L) 03/14/2018 1148   GFRNONAA >60 04/20/2014 1206   GFRAA >60 03/14/2018 1148   GFRAA >60 04/20/2014 1206   Lab Results  Component Value Date   WBC 10.6 03/14/2018   HGB 16.1 03/14/2018   HCT 45.5 03/14/2018   MCV 92.5 03/14/2018   PLT 232 03/14/2018    Imaging Studies: No results found.  Assessment and Plan:   Kevin Esparza is a 60 y.o. 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  Has had chronic diarrhea for years with bowel movements varying between 2-3 loose bowel movements a day, to no bowel movements a day.  No weight loss.  No abdominal pain  Dysphagia resolved   Dr Kevin Esparza

## 2021-01-09 NOTE — Progress Notes (Signed)
Tumor Board Documentation  Kevin Esparza was presented by Dr Rogue Bussing at our Tumor Board on 01/08/2021, which included representatives from medical oncology,radiation oncology,internal medicine,navigation,pathology,radiology,surgical,pharmacy,research,palliative care.  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  Additionally, we reviewed previous medical and familial history, history of present illness, and recent lab results along with all available histopathologic and imaging studies. The tumor board considered available treatment options and made the following recommendations: Surgery (Patient has been refereed to [facility] for surgery)    The following procedures/referrals were also placed: No orders of the defined types were placed in this encounter.   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  Tumor board is a meeting of clinicians from various specialty areas who evaluate and discuss patients for whom a multidisciplinary approach is being considered. Final determinations in the plan of care are those of the provider(s). The responsibility for follow up of recommendations given during tumor board is that of the provider.   Today's extended care, comprehensive team conference, Kevin Esparza was not present for the discussion and was not examined.   Multidisciplinary Tumor Board is a multidisciplinary case peer review process.  Decisions discussed in the Multidisciplinary Tumor Board reflect the opinions of the specialists present at the conference without having examined the patient.  Ultimately, treatment and diagnostic decisions rest with the primary provider(s) and the patient.

## 2021-01-15 ENCOUNTER — Other Ambulatory Visit: Payer: Self-pay

## 2021-01-15 ENCOUNTER — Inpatient Hospital Stay: Payer: Medicare Other

## 2021-01-15 ENCOUNTER — Encounter: Payer: Self-pay | Admitting: Oncology

## 2021-01-15 ENCOUNTER — Inpatient Hospital Stay: Payer: Medicare Other | Attending: Oncology | Admitting: Oncology

## 2021-01-15 VITALS — BP 115/71 | HR 62 | Temp 97.2°F | Resp 18 | Wt 185.4 lb

## 2021-01-15 DIAGNOSIS — Z87891 Personal history of nicotine dependence: Secondary | ICD-10-CM

## 2021-01-15 DIAGNOSIS — Z809 Family history of malignant neoplasm, unspecified: Secondary | ICD-10-CM | POA: Diagnosis not present

## 2021-01-15 DIAGNOSIS — C7A8 Other malignant neuroendocrine tumors: Secondary | ICD-10-CM | POA: Diagnosis not present

## 2021-01-15 DIAGNOSIS — K635 Polyp of colon: Secondary | ICD-10-CM | POA: Diagnosis not present

## 2021-01-15 NOTE — Progress Notes (Signed)
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  HISTORY OF PRESENTING ILLNESS:   Kevin Esparza is a  60 y.o.  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  He underwent EGD and colonoscopy for screening. EGD showed Schatzki's ring which was dilated to 18 mm.   Multiple biopsy was done, submucosal nodule found in the stomach, nodular mucosa in the gastric antrum less curvature, single mucosal papule found in the stomach. Biopsy were obtained and a gastric body at incisura and in the gastric antrum. EGD biopsies showed chronic gastritis.  Lesser curvature stomach biopsy showed well-differentiated neuroendocrine tumor, grade 2. Esophagus mucosa with changes consistent with reflux esophagitis.   Colonoscopy showed 20 mm polyp in the cecum, resection not attempted.  Injected. 12 mm polyp in ascending colon, resected and retrieved. 12 mm polyp in the transverse colon, resected and retrieved. 10 mm polyp in the descending colon, resected and retrieved. 5 small polyps in the sigmoid colon and the descending colon, resected and retrieved.  Nonbleeding internal hemorrhoids. Colonoscopy biopsy showed polyp in the ascending, transverse, descending and sigmoid colon are tubular adenoma.  Negative for high-grade dysplasia and  malignancy.   Patient was referred to oncology for further evaluation. Patient has been referred to Dr. Rush Landmark for removal of the cecal polyp, and removal of grade 2 well-differentiated neuroendocrine tumor at the lesser curvature of the stomach.  Today patient reports feeling well.  Denies any symptoms of flushing.  He has chronic diarrhea, 2-3 loose bowel months per day.  Denies any unintentional weight loss, abdominal pain, fever or chills.  Review of Systems  Constitutional: Negative for appetite change, chills, fatigue, fever and unexpected weight change.  HENT:   Negative for hearing loss and voice change.   Eyes: Negative for eye problems and icterus.  Respiratory: Negative for chest tightness, cough and shortness of breath.   Cardiovascular: Negative for chest pain and leg swelling.  Gastrointestinal: Positive for diarrhea. Negative for abdominal distention and abdominal pain.  Endocrine: Negative for hot flashes.  Genitourinary: Negative for difficulty urinating, dysuria and frequency.   Musculoskeletal: Negative for arthralgias.  Skin: Negative for itching and rash.  Neurological: Negative for light-headedness and numbness.  Hematological: Negative for adenopathy. Does not bruise/bleed easily.  Psychiatric/Behavioral: Negative for confusion.    MEDICAL HISTORY:  Past Medical History:  Diagnosis Date  . Anxiety   . Diabetes mellitus without complication ([place])   . ED (erectile dysfunction)   . Elevated PSA   . Heartburn   . History of BPH   . HLD (hyperlipidemia)   . HTN (hypertension)   . Pneumonia     SURGICAL HISTORY: Past Surgical History:  Procedure Laterality Date  . APPENDECTOMY  2005  . CERVICAL SPINE SURGERY  1997   c4-c7  . CHOLECYSTECTOMY  2005  . COLONOSCOPY WITH PROPOFOL N/A 12/31/2020   Procedure: COLONOSCOPY WITH PROPOFOL;  Surgeon: Virgel Manifold, MD;  Location: [facility] ENDOSCOPY;  Service: Endoscopy;  Laterality: N/A;  .  ESOPHAGOGASTRODUODENOSCOPY (EGD) WITH PROPOFOL N/A 12/31/2020   Procedure: ESOPHAGOGASTRODUODENOSCOPY (EGD) WITH PROPOFOL;  Surgeon: Virgel Manifold, MD;  Location: [facility] ENDOSCOPY;  Service: Endoscopy;  Laterality: N/A;  . TONSILLECTOMY      SOCIAL HISTORY: Social History   Socioeconomic History  . Marital status: Married    Spouse name: Not on file  . Number of children: Not on file  . Years of education: Not on file  . Highest education level: Not on file  Occupational History  . Not on file  Tobacco Use  . Smoking status: Former Smoker    Years: 14.00    Types: Cigarettes    Quit date: 03/05/2004    Years since quitting: 16.8  . Smokeless tobacco: Current User    Types: Chew  Vaping Use  . Vaping Use: Never used  Substance and Sexual Activity  . Alcohol use: Yes    Alcohol/week: 1.0 standard drink    Types: 1 Cans of beer per week    Comment: occaionally  . Drug use: No  . Sexual activity: Not on file  Other Topics Concern  . Not on file  Social History Narrative  . Not on file   Social Determinants of Health   Financial Resource Strain: Not on file  Food Insecurity: Not on file  Transportation Needs: Not on file  Physical Activity: Not on file  Stress: Not on file  Social Connections: Not on file  Intimate Partner Violence: Not on file    FAMILY HISTORY: Family History  Problem Relation Age of Onset  . Kidney cancer Maternal Grandmother   . Liver cancer Maternal Grandmother   . Stroke Father   . Cancer Father        unknown origin  . Hypertension Mother   . Diabetes Mother   . Osteoarthritis Mother   . Osteoporosis Mother   . Lung cancer Maternal Grandfather   . Kidney disease Neg Hx   . Prostate cancer Neg Hx   . Bladder Cancer Neg Hx     ALLERGIES:  has No Known Allergies.  MEDICATIONS:  Current Outpatient Medications  Medication Sig Dispense Refill  . amLODipine (NORVASC) 10 MG tablet Take 10 mg by mouth daily.    Marland Kitchen aspirin 81 MG  chewable tablet Chew by mouth daily.    . cyclobenzaprine (FLEXERIL) 10 MG tablet Take 10 mg by mouth daily.    . enalapril (VASOTEC) 5 MG tablet Take 5 mg by mouth daily.    Marland Kitchen gabapentin (NEURONTIN) 300 MG capsule Take 300 mg by mouth 3 (three) times daily.    . metFORMIN (GLUCOPHAGE) 1000 MG tablet Take 1,000 mg by mouth 2 (two) times daily with a meal.    . metoprolol succinate (TOPROL-XL) 50 MG 24 hr tablet Take 50 mg by mouth daily. Take with or immediately following a meal.    . omeprazole (PRILOSEC) 20 MG capsule Take 1 capsule (20 mg total) by mouth daily. 30 capsule 0  . sildenafil (REVATIO) 20 MG tablet Take 3 to 5 tablets two hours before intercouse on an empty stomach.  Do not take with nitrates. (Patient taking differently: Take 3 to 5 tablets two hours before intercouse on an empty stomach.  Do not take with nitrates.) 50 tablet 3  . Na Sulfate-K Sulfate-Mg Sulf 17.5-3.13-1.6 GM/177ML SOLN At 5 PM the day before procedure take 1 bottle and 5 hours before procedure take 1 bottle. (Patient not taking: Reported on 01/15/2021) 354 mL  0   No current facility-administered medications for this visit.     PHYSICAL EXAMINATION: ECOG PERFORMANCE STATUS: 0 - Asymptomatic Vitals:   01/15/21 1333  BP: 115/71  Pulse: 62  Resp: 18  Temp: (!) 97.2 F (36.2 C)   Filed Weights   01/15/21 1333  Weight: 185 lb 6.4 oz (84.1 kg)    Physical Exam Constitutional:      General: He is not in acute distress. HENT:     Head: Normocephalic and atraumatic.  Eyes:     General: No scleral icterus. Cardiovascular:     Rate and Rhythm: Normal rate and regular rhythm.     Heart sounds: Normal heart sounds.  Pulmonary:     Effort: Pulmonary effort is normal. No respiratory distress.     Breath sounds: No wheezing.     Comments: Decreased breath sound bilaterally. Abdominal:     General: Bowel sounds are normal. There is no distension.     Palpations: Abdomen is soft.  Musculoskeletal:         General: No deformity. Normal range of motion.     Cervical back: Normal range of motion and neck supple.  Skin:    General: Skin is warm and dry.     Findings: No erythema or rash.  Neurological:     Mental Status: He is alert and oriented to person, place, and time. Mental status is at baseline.     Cranial Nerves: No cranial nerve deficit.     Coordination: Coordination normal.  Psychiatric:        Mood and Affect: Mood normal.     LABORATORY DATA:  I have reviewed the data as listed Lab Results  Component Value Date   WBC 10.6 03/14/2018   HGB 16.1 03/14/2018   HCT 45.5 03/14/2018   MCV 92.5 03/14/2018   PLT 232 03/14/2018   No results for input(s): NA, K, CL, CO2, GLUCOSE, BUN, CREATININE, CALCIUM, GFRNONAA, GFRAA, PROT, ALBUMIN, AST, ALT, ALKPHOS, BILITOT, BILIDIR, IBILI in the last 8760 hours. Iron/TIBC/Ferritin/ %Sat No results found for: IRON, TIBC, FERRITIN, IRONPCTSAT    RADIOGRAPHIC STUDIES: I have personally reviewed the radiological images as listed and agreed with the findings in the report. No results found.    ASSESSMENT & PLAN:  1. Neuroendocrine carcinoma ([place])   2. Polyp of colon, unspecified part of colon, unspecified type    Gastric well-differentiated neuroendocrine carcinoma. Need to determined types.  Check vitamin B12 level. Patient is on PPI, which may affect gastrin level. Discussed case with pathologist Dr.Rubinas and GI Dr.T.  I advise patient to stop PPI for 2 weeks and check gastrin level Dr.Tahiliani has referred to Dr. Rush Landmark for EUS resection,and measuring gastric PH.  Check CT abdomen pelvis with contrast.  Colon Polyp, patient has been referred to Dr.Mansouraty for polyp resection.   Orders Placed This Encounter  Procedures  . Gastrin    Standing Status:   Future    Standing Expiration Date:   01/15/2022    All questions were answered. The patient knows to call the clinic with any problems questions or  concerns.  cc Virgel Manifold, MD    Return of visit: to be determined.  Thank you for this kind referral and the opportunity to participate in the care of this patient. A copy of today's note is routed to referring provider    Earlie Server, MD, PhD Hematology Oncology [facility] Pager- [phone] 01/15/2021

## 2021-01-15 NOTE — Progress Notes (Signed)
New patient evaluation.   

## 2021-01-16 ENCOUNTER — Telehealth: Payer: Self-pay

## 2021-01-16 DIAGNOSIS — C7A8 Other malignant neuroendocrine tumors: Secondary | ICD-10-CM

## 2021-01-16 NOTE — Telephone Encounter (Signed)
-----   Message from Earlie Server, MD sent at 01/16/2021  1:03 PM EDT ----- I talked to both pathology and GI  Please arrange him to get lab encounter around 4/1. Ordered.  Also arrange next available CT abdomen w contrast -reason is gastric neuroendocrine cancer He knows the plan

## 2021-01-16 NOTE — Telephone Encounter (Signed)
Done.. Appts has been sched as requested Pt was made aware. A NEW appt letter Will be mailed out also

## 2021-01-16 NOTE — Telephone Encounter (Signed)
Please contact patient to schedule as MD recommends.   

## 2021-01-19 ENCOUNTER — Telehealth: Payer: Self-pay

## 2021-01-19 NOTE — Telephone Encounter (Signed)
The pt has been scheduled for appt with Dr Rush Landmark on 4/6 at 230 pm to discuss EUS EMR.  He prefers to set up procedure  after clinic visit.

## 2021-01-19 NOTE — Telephone Encounter (Signed)
-----   Message from Irving Copas., MD sent at 01/17/2021  1:15 AM EDT ----- VT, Thank you for reaching out. Not sure why this is actually the first time I hear about this patient.  I do see that the referral was placed twice but not sure that it was and acted upon by the [facility] scheduling staff.  I will place my nurse on this as well so that we can figure out where in the referral process this did not get sent to my nurse to then be sent to me for evaluation. In either case, I am happy to be available based on scheduling availability this will be 6 to 10 weeks most likely for a procedure(s). We do not have litmus pH strip at our hospitals any more but I can look into seeing whether that could be obtained.  Gregory Dowe, Please move forward with scheduling the patient a clinic visit to discuss advanced resection attempt of this nonlifting cecal polyp and also EUS/EMR of gastric neuroendocrine tumor. Please move forward with offering him an upper EUS/colonoscopy 2-hour slot at the same time, if he agrees to move forward with the procedures morning he wants to wait until the clinic visit, that is fine as well if that is what he and his wife wanted.    Bridgit Eynon and Barbera Setters can you find out what happened with why this never got to you Chong Sicilian) from our scheduling staff?  Thanks. GM ----- Message ----- From: Virgel Manifold, MD Sent: 01/16/2021  11:46 AM EDT To: Earlie Server, MD, Irving Copas., MD  Hi Dr. Rush Landmark,  I had referred this pt to you for 1. Cecal polyp removal 2. 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   Not sure if you have had a chance to look at the images yet. Also wanted to get your thoughts on doing intragastric pH when you do his EGD?

## 2021-01-20 ENCOUNTER — Encounter: Payer: Self-pay | Admitting: Gastroenterology

## 2021-01-21 ENCOUNTER — Other Ambulatory Visit: Payer: Self-pay | Admitting: Gastroenterology

## 2021-01-22 DIAGNOSIS — K635 Polyp of colon: Secondary | ICD-10-CM | POA: Insufficient documentation

## 2021-01-22 DIAGNOSIS — C7A8 Other malignant neuroendocrine tumors: Secondary | ICD-10-CM | POA: Insufficient documentation

## 2021-01-27 ENCOUNTER — Ambulatory Visit
Admission: RE | Admit: 2021-01-27 | Discharge: 2021-01-27 | Disposition: A | Discharge status: Home / Self Care | Payer: Medicare Other | Source: Ambulatory Visit | Attending: Oncology | Admitting: Oncology

## 2021-01-27 ENCOUNTER — Other Ambulatory Visit: Payer: Self-pay

## 2021-01-27 DIAGNOSIS — C7A8 Other malignant neuroendocrine tumors: Secondary | ICD-10-CM | POA: Diagnosis not present

## 2021-01-27 HISTORY — DX: Heart failure, unspecified: I50.9

## 2021-01-27 HISTORY — DX: Malignant (primary) neoplasm, unspecified: C80.1

## 2021-01-27 LAB — POCT I-STAT CREATININE: Creatinine, Ser: 1 mg/dL (ref 0.61–1.24)

## 2021-01-27 MED ORDER — IOHEXOL 300 MG/ML  SOLN
100.0000 mL | Freq: Once | INTRAMUSCULAR | Status: AC | PRN
  Administered 2021-01-27: 100 mL via INTRAVENOUS

## 2021-01-30 ENCOUNTER — Inpatient Hospital Stay: Payer: Medicare Other | Attending: Oncology

## 2021-01-30 ENCOUNTER — Other Ambulatory Visit: Payer: Self-pay

## 2021-01-30 DIAGNOSIS — C7A8 Other malignant neuroendocrine tumors: Secondary | ICD-10-CM

## 2021-01-30 LAB — COMPREHENSIVE METABOLIC PANEL
ALT: 30 U/L (ref 0–44)
AST: 25 U/L (ref 15–41)
Albumin: 4 g/dL (ref 3.5–5.0)
Alkaline Phosphatase: 64 U/L (ref 38–126)
Anion gap: 8 (ref 5–15)
BUN: 10 mg/dL (ref 6–20)
CO2: 27 mmol/L (ref 22–32)
Calcium: 9.3 mg/dL (ref 8.9–10.3)
Chloride: 105 mmol/L (ref 98–111)
Creatinine, Ser: 1.14 mg/dL (ref 0.61–1.24)
GFR, Estimated: >60 mL/min (ref >60)
Glucose, Bld: 110 mg/dL — ABNORMAL HIGH (ref 70–99)
Potassium: 4.3 mmol/L (ref 3.5–5.1)
Sodium: 140 mmol/L (ref 135–145)
Total Bilirubin: 1 mg/dL (ref 0.3–1.2)
Total Protein: 7.3 g/dL (ref 6.5–8.1)

## 2021-01-30 LAB — CBC WITH DIFFERENTIAL/PLATELET
Abs Immature Granulocytes: 0.06 10*3/uL (ref 0.00–0.07)
Basophils Absolute: 0 10*3/uL (ref 0.0–0.1)
Basophils Relative: 1 %
Eosinophils Absolute: 0.4 10*3/uL (ref 0.0–0.5)
Eosinophils Relative: 6 %
HCT: 45.1 % (ref 39.0–52.0)
Hemoglobin: 15.8 g/dL (ref 13.0–17.0)
Immature Granulocytes: 1 %
Lymphocytes Relative: 35 %
Lymphs Abs: 2.4 10*3/uL (ref 0.7–4.0)
MCH: 30.2 pg (ref 26.0–34.0)
MCHC: 35 g/dL (ref 30.0–36.0)
MCV: 86.1 fL (ref 80.0–100.0)
Monocytes Absolute: 0.6 10*3/uL (ref 0.1–1.0)
Monocytes Relative: 9 %
Neutro Abs: 3.3 10*3/uL (ref 1.7–7.7)
Neutrophils Relative %: 48 %
Platelets: 159 10*3/uL (ref 150–400)
RBC: 5.24 MIL/uL (ref 4.22–5.81)
RDW: 13.3 % (ref 11.5–15.5)
WBC: 6.8 10*3/uL (ref 4.0–10.5)
nRBC: 0 % (ref 0.0–0.2)

## 2021-01-30 LAB — VITAMIN B12: Vitamin B-12: 312 pg/mL (ref 180–914)

## 2021-01-31 LAB — GASTRIN: Gastrin: 22 pg/mL (ref 0–115)

## 2021-02-02 ENCOUNTER — Other Ambulatory Visit: Payer: Self-pay

## 2021-02-02 DIAGNOSIS — Z87898 Personal history of other specified conditions: Secondary | ICD-10-CM

## 2021-02-03 ENCOUNTER — Other Ambulatory Visit: Payer: Self-pay

## 2021-02-03 ENCOUNTER — Other Ambulatory Visit: Payer: Medicare Other

## 2021-02-03 DIAGNOSIS — Z87898 Personal history of other specified conditions: Secondary | ICD-10-CM

## 2021-02-04 ENCOUNTER — Other Ambulatory Visit (INDEPENDENT_AMBULATORY_CARE_PROVIDER_SITE_OTHER): Payer: Medicare Other

## 2021-02-04 ENCOUNTER — Encounter: Payer: Self-pay | Admitting: Gastroenterology

## 2021-02-04 ENCOUNTER — Ambulatory Visit (INDEPENDENT_AMBULATORY_CARE_PROVIDER_SITE_OTHER): Payer: Medicare Other | Admitting: Gastroenterology

## 2021-02-04 ENCOUNTER — Telehealth: Payer: Self-pay

## 2021-02-04 VITALS — BP 110/74 | HR 64 | Ht 70.0 in | Wt 186.0 lb

## 2021-02-04 DIAGNOSIS — R198 Other specified symptoms and signs involving the digestive system and abdomen: Secondary | ICD-10-CM

## 2021-02-04 DIAGNOSIS — D3A8 Other benign neuroendocrine tumors: Secondary | ICD-10-CM | POA: Diagnosis not present

## 2021-02-04 DIAGNOSIS — R933 Abnormal findings on diagnostic imaging of other parts of digestive tract: Secondary | ICD-10-CM

## 2021-02-04 DIAGNOSIS — Z86010 Personal history of colonic polyps: Secondary | ICD-10-CM

## 2021-02-04 DIAGNOSIS — K635 Polyp of colon: Secondary | ICD-10-CM

## 2021-02-04 LAB — PROTIME-INR
INR: 1 ratio (ref 0.8–1.0)
Prothrombin Time: 11 s (ref 9.6–13.1)

## 2021-02-04 LAB — PSA: Prostate Specific Ag, Serum: 2 ng/mL (ref 0.0–4.0)

## 2021-02-04 MED ORDER — SUPREP BOWEL PREP KIT 17.5-3.13-1.6 GM/177ML PO SOLN: 1.0000 | ORAL | 0 refills | Status: DC

## 2021-02-04 MED ORDER — PLENVU 140 G PO SOLR: 1.0000 | ORAL | 0 refills | Status: DC

## 2021-02-04 NOTE — Patient Instructions (Signed)
Your provider has requested that you go to the basement level for lab work before leaving today. Press "B" on the elevator. The lab is located at the first door on the left as you exit the elevator.   After submitting labs today, you may restart your Omeprazole.   You have been scheduled for an endoscopy and colonoscopy. Please follow the written instructions given to you at your visit today. Please pick up your prep supplies at the pharmacy within the next 1-3 days. If you use inhalers (even only as needed), please bring them with you on the day of your procedure.  We have sent the following medications to your pharmacy for you to pick up at your convenience: Suprep   Due to recent changes in healthcare laws, you may see the results of your imaging and laboratory studies on MyChart before your provider has had a chance to review them.  We understand that in some cases there may be results that are confusing or concerning to you. Not all laboratory results come back in the same time frame and the provider may be waiting for multiple results in order to interpret others.  Please give [place] 48 hours in order for your provider to thoroughly review all the results before contacting the office for clarification of your results.   If you are age 18 or younger, your body mass index should be between 19-25. Your Body mass index is 26.69 kg/m. If this is out of the aformentioned range listed, please consider follow up with your Primary Care Provider.   Thank you for choosing me and [facility].  Dr. Rush Landmark

## 2021-02-04 NOTE — Telephone Encounter (Signed)
Pt's prep was changed from Suprep to Plenuv. I did not change instructions on paper, but I did verbally explain instructions to pt before he left office. Pt voiced understanding and will call me with any questions.

## 2021-02-05 LAB — CHROMOGRANIN A: Chromogranin A (ng/mL): 65.9 ng/mL (ref 0.0–101.8)

## 2021-02-06 ENCOUNTER — Ambulatory Visit: Payer: Self-pay | Admitting: Urology

## 2021-02-06 NOTE — Progress Notes (Signed)
[facility] VISIT   Primary Care Provider Ranae Plumber, [place] [address] [phone]  Referring Provider Dr. Bonna Gains  Patient Profile: Kevin Esparza is a 60 y.o. male with a pmh significant for hypertension, hyperlipidemia, CHF, GERD, diabetes, colon polyps, Gastric NET.  The patient presents to the [facility] for an evaluation and management of problem(s) noted below:  Problem List 1. Neuroendocrine neoplasm of gastrointestinal tract   2. Cecal polyp   3. History of colon polyps   4. Abnormal findings on esophagogastroduodenoscopy (EGD)   5. Abnormal colonoscopy     History of Present Illness This is a patient referred by Dr. Bonna Gains for further work-up and evaluation of a gastric neuroendocrine tumor as well as a nonlifting cecal polyp.  patient recently underwent upper and lower endoscopy for evaluation of dysphagia and acid reflux as well as colon cancer screening.  Patient was found to have a gastric nodule that was biopsied and returned as a neuroendocrine tumor.  There was a cecal polyp that was attempted for lift but did not lift during colonoscopy.  He was initially referred to [facility] but he and his family decided that they would like to stay local and thus the referral to [place] here at [facility].  Patient continues to experience acid reflux as well as some difficulty swallowing.  He remains on PPI therapy.  If he is not on PPI therapy he significantly feels acid reflux.  He is not taking any significant nonsteroidals or BC/Goody powders.  No family history of GI malignancies that he is aware of.  He has been seen by oncology in [place].  GI Review of Systems Positive as above Negative for odynophagia, nausea, vomiting, change in bowel habits, melena, hematochezia  Review of Systems General: Denies fevers/chills/weight loss unintentionally HEENT: Denies oral lesions Cardiovascular: Denies chest  pain/palpitations Pulmonary: Denies shortness of breath Gastroenterological: See HPI Genitourinary: Denies darkened urine Hematological: Denies easy bruising/bleeding Endocrine: Denies temperature intolerance Dermatological: Denies jaundice Psychological: Mood is stable   Medications Current Outpatient Medications  Medication Sig Dispense Refill  . amLODipine (NORVASC) 10 MG tablet Take 10 mg by mouth daily.    Marland Kitchen aspirin 81 MG chewable tablet Chew by mouth daily.    . cyclobenzaprine (FLEXERIL) 10 MG tablet Take 10 mg by mouth daily.    . enalapril (VASOTEC) 5 MG tablet Take 5 mg by mouth daily.    Marland Kitchen gabapentin (NEURONTIN) 300 MG capsule Take 300 mg by mouth 3 (three) times daily.    . metFORMIN (GLUCOPHAGE) 1000 MG tablet Take 1,000 mg by mouth 2 (two) times daily with a meal.    . metoprolol succinate (TOPROL-XL) 50 MG 24 hr tablet Take 50 mg by mouth daily. Take with or immediately following a meal.    . PEG-KCl-NaCl-NaSulf-Na Asc-C (PLENVU) 140 g SOLR Take 1 kit by mouth as directed. Use coupon: BIN: [id number] PNC: CNRX Group: [id number] ID: [id number] 1 each 0  . sildenafil (REVATIO) 20 MG tablet Take 3 to 5 tablets two hours before intercouse on an empty stomach.  Do not take with nitrates. (Patient taking differently: Take 3 to 5 tablets two hours before intercouse on an empty stomach.  Do not take with nitrates.) 50 tablet 3  . omeprazole (PRILOSEC) 20 MG capsule Take 1 capsule (20 mg total) by mouth daily. 30 capsule 0   No current facility-administered medications for this visit.    Allergies No Known Allergies  Histories Past Medical History:  Diagnosis Date  . Anxiety   . Cancer ([place])   . CHF (congestive heart failure) ([place])   . Diabetes mellitus without complication ([place])   . ED (erectile dysfunction)   . Elevated PSA   . Heartburn   . History of BPH   . HLD (hyperlipidemia)   . HTN (hypertension)   . Pneumonia    Past Surgical History:  Procedure Laterality  Date  . APPENDECTOMY  2005  . CERVICAL SPINE SURGERY  1997   c4-c7  . CHOLECYSTECTOMY  2005  . COLONOSCOPY WITH PROPOFOL N/A 12/31/2020   Procedure: COLONOSCOPY WITH PROPOFOL;  Surgeon: [place] Manifold, MD;  Location: [facility] ENDOSCOPY;  Service: Endoscopy;  Laterality: N/A;  . ESOPHAGOGASTRODUODENOSCOPY (EGD) WITH PROPOFOL N/A 12/31/2020   Procedure: ESOPHAGOGASTRODUODENOSCOPY (EGD) WITH PROPOFOL;  Surgeon: [place]gel Manifold, MD;  Location: [facility] ENDOSCOPY;  Service: Endoscopy;  Laterality: N/A;  . TONSILLECTOMY     Social History   Socioeconomic History  . Marital status: Married    Spouse name: Not on file  . Number of children: 3  . Years of education: Not on file  . Highest education level: Not on file  Occupational History  . Not on file  Tobacco Use  . Smoking status: Former Smoker    Years: 14.00    Types: Cigarettes    Quit date: 03/05/2004    Years since quitting: 16.9  . Smokeless tobacco: Current User    Types: Chew  Vaping Use  . Vaping Use: Never used  Substance and Sexual Activity  . Alcohol use: Yes    Alcohol/week: 1.0 standard drink    Types: 1 Cans of beer per week    Comment: occaionally  . Drug use: No  . Sexual activity: Yes  Other Topics Concern  . Not on file  Social History Narrative  . Not on file   Social Determinants of Health   Financial Resource Strain: Not on file  Food Insecurity: Not on file  Transportation Needs: Not on file  Physical Activity: Not on file  Stress: Not on file  Social Connections: Not on file  Intimate Partner Violence: Not on file   Family History  Problem Relation Age of Onset  . Kidney cancer Maternal Grandmother   . Liver cancer Maternal Grandmother   . Stroke Father   . Cancer Father        unknown origin  . Hypertension Mother   . Diabetes Mother   . Osteoarthritis Mother   . Osteoporosis Mother   . Lung cancer Maternal Grandfather   . Kidney disease Neg Hx   . Prostate cancer Neg Hx   . Bladder  Cancer Neg Hx   . Colon cancer Neg Hx   . Esophageal cancer Neg Hx   . Inflammatory bowel disease Neg Hx   . Pancreatic cancer Neg Hx   . Rectal cancer Neg Hx   . Stomach cancer Neg Hx    I have reviewed his medical, social, and family history in detail and updated the electronic medical record as necessary.    PHYSICAL EXAMINATION  BP 110/74   Pulse 64   Ht 5' 10" (1.778 m)   Wt 186 lb (84.4 kg)   BMI 26.69 kg/m  Wt Readings from Last 3 Encounters:  02/04/21 186 lb (84.4 kg)  01/15/21 185 lb 6.4 oz (84.1 kg)  01/07/21 185 lb (83.9 kg)  GEN: NAD, appears stated age, doesn't appear chronically ill PSYCH: Cooperative, without pressured speech EYE: Conjunctivae  pink, sclerae anicteric ENT: Masked CV: RR without R/Gs  RESP: CTAB posteriorly, without wheezing GI: NABS, soft, NT/ND, without rebound or guarding, no HSM appreciated MSK/EXT: No lower extremity edema SKIN: No jaundice NEURO:  Alert & Oriented x 3, no focal deficits   REVIEW OF DATA  I reviewed the following data at the time of this encounter:  GI Procedures and Studies  March 2022 EGD - Mild Schatzki ring. Dilated. - Erythematous mucosa in the antrum. Biopsied. - A single submucosal papule (nodule) found in the stomach. Biopsied. - Nodular mucosa in the gastric antrum (lesser curvature). Biopsied. - A single mucosal papule (nodule) found in the stomach. Biopsied. - Normal duodenal bulb, second portion of the duodenum and examined duodenum. - Biopsies were obtained in the gastric body, at the incisura and in the gastric antrum.  March 2022 Colonoscopy - One 20 mm polyp in the cecum. Resection not attempted. Injected. - One 12 mm polyp in the ascending colon, removed using lift and cut and a hot snare. Resected and retrieved. Injected. Clip was placed. - One 12 mm polyp in the transverse colon, removed using injection-lift and a hot snare. Resected and retrieved. Injected. - One 10 mm polyp in the descending  colon, removed with a jumbo cold forceps. Resected and retrieved. - Five 5 to 8 mm polyps in the sigmoid colon and in the descending colon, removed with a cold snare. Resected and retrieved. - Non-bleeding internal hemorrhoids. - The examination was otherwise normal.  Pathology DIAGNOSIS:  A. STOMACH; COLD BIOPSY:  - ANTRAL MUCOSA WITH REACTIVE AND FOCAL MILD CHRONIC GASTRITIS,  NON-SPECIFIC.  - IHC FOR H.PYLORI IS NEGATIVE.  - NEGATIVE FOR DYSPLASIA AND MALIGNANCY.  B. STOMACH, INCISURA; COLD BIOPSY:  - FOCAL MILD CHRONIC GASTRITIS, NON-SPECIFIC.  - NEGATIVE FOR H. PYLORI, DYSPLASIA, AND MALIGNANCY. C. STOMACH, POLYPOID AREA; COLD BIOPSY:  - OXYNTIC MUCOSA WITH FOCAL DENSE AREA OF CHRONIC GASTRITIS INVOLVING  THE DEEP ASPECT OF THE MUCOSA, SEE COMMENT.  - IHC FOR H.PYLORI IS NEGATIVE.  - NEGATIVE FOR DYSPLASIA AND MALIGNANCY.  D. 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  - SEE COMMENT.  E. ESOPHAGUS; COLD BIOPSY:  - SQUAMOUS MUCOSA WITH CHANGES CONSISTENT WITH REFLUX ESOPHAGITIS. - NEGATIVE FOR DYSPLASIA AND MALIGNANCY. F. COLON POLYP, ASCENDING; HOT SNARE:  - TUBULAR ADENOMA.  - NEGATIVE FOR HIGH-GRADE DYSPLASIA AND MALIGNANCY.  G. COLON POLYP, TRANSVERSE; HOT SNARE:  - TUBULAR ADENOMA.  - NEGATIVE FOR HIGH-GRADE DYSPLASIA AND MALIGNANCY. H. COLON POLYP X6, DESCENDING (2) AND SIGMOID (4); COLD SNARE AND COLD  BIOPSY:  - TUBULAR ADENOMA (MULTIPLE FRAGMENTS).  - NEGATIVE FOR HIGH-GRADE DYSPLASIA AND MALIGNANCY.  Comment:  Sections of the biopsy of the lesser curvature (part D) demonstrate  oxyntic mucosa with focal infiltrative nests of epithelial cells that  span 1.8 mm and extend to the biopsy edges. Immunohistochemical stains  demonstrate that these nests are positive for neuroendocrine markers  chromogranin and synaptophysin. Ki-67 marks approximately 6% of  lesional nuclei. Based on  these findings the lesion is classified as  well-differentiated neuroendocrine tumor, grade 2   Laboratory Studies  Reviewed those in epic  Imaging Studies  March 2022 CT abdomen with contrast IMPRESSION: 1. No gastric mucosal or submucosal lesion identified. 2. No upper abdominal lymphadenopathy. 3. Enlarging nodule LEFT adrenal gland slowly increased in size from 2008. Recommend dedicated MRI with contrast renal protocol for further characterization.   ASSESSMENT  Mr. Victory is a  60 y.o. male with a pmh significant for hypertension, hyperlipidemia, CHF, GERD, diabetes, colon polyps, Gastric NET.  The patient is seen today for evaluation and management of:  1. Neuroendocrine neoplasm of gastrointestinal tract   2. Cecal polyp   3. History of colon polyps   4. Abnormal findings on esophagogastroduodenoscopy (EGD)   5. Abnormal colonoscopy    The patient is hemodynamically stable.  Clinically he is also stable.  He has a gastric neuroendocrine tumor that requires endoscopic ultrasound imaging as well as attempt at resection.  He has a nonlifting cecal polyp that requires intervention.  Based upon the description and endoscopic pictures I do feel that it is reasonable to pursue an Advanced Polypectomy attempt of the polyps/lesions.  We discussed some of the techniques of advanced polypectomy which include Endoscopic Mucosal Resection, OVESCO Full-Thickness Resection, Endorotor Morcellation, and Tissue Ablation via Fulguration.  We also reviewed images of typical techniques as noted above.  The risks and benefits of endoscopic evaluation were discussed with the patient; these include but are not limited to the risk of perforation, infection, bleeding, missed lesions, lack of diagnosis, severe illness requiring hospitalization, as well as anesthesia and sedation related illnesses.  During attempts at advanced resection, the risks of bleeding and perforation/leak are increased as opposed to  diagnostic and screening procedures, and that was discussed with the patient as well.   In addition, I explained that with the possible need for piecemeal resection, subsequent short-interval endoscopic evaluation for follow up and potential retreatment of the lesion/area may be necessary.  I did offer, a referral to surgery in order for patient to have opportunity to discuss surgical management/intervention prior to finalizing decision for attempt at endoscopic removal, however, the patient deferred on this.  If, after attempt at removal of the polyp/lesion, it is found that the patient has a complication or that an invasive lesion or malignant lesion is found, or that the polyp/lesion continues to recur, the patient is aware and understands that surgery may still be indicated/required.  The risks of an EUS including intestinal perforation, bleeding, infection, aspiration, and medication effects were discussed as was the possibility it may not give a definitive diagnosis if a biopsy is performed.  When a biopsy of the pancreas is done as part of the EUS, there is an additional risk of pancreatitis at the rate of about 1-2%.  It was explained that procedure related pancreatitis is typically mild, although it can be severe and even life threatening, which is why we do not perform random pancreatic biopsies and only biopsy a lesion/area we feel is concerning enough to warrant the risk.  All patient questions were answered, to the best of my ability, and the patient agrees to the aforementioned plan of action with follow-up as indicated.   PLAN  Obtain chromogranin A level (patient off PPI therapy currently) May restart PPI therapy thereafter Proceed with preprocedure labs as outlined below Proceed with scheduling EGD/EUS/colonoscopy with EMR attempt in hospital-based setting Follow-up to be dictated based on findings at time of upper and lower endoscopy Further work-up/management of patient's GI symptoms as  per his primary gastroenterologist   Orders Placed This Encounter  Procedures  . Procedural/ Surgical Case Request: COLONOSCOPY WITH PROPOFOL, UPPER ESOPHAGEAL ENDOSCOPIC ULTRASOUND (EUS)  . INR/PT  . Chromogranin A  . Ambulatory referral to Gastroenterology    New Prescriptions   PEG-KCL-NACL-NASULF-NA ASC-C (PLENVU) 140 G SOLR    Take 1 kit by mouth as directed. Use coupon: BIN: [id number]   PNC: CNRX Group: [id number] ID: [id number]   Modified Medications   No medications on file    Planned Follow Up No follow-ups on file.   Total Time in Face-to-Face and in Coordination of Care for patient including independent/personal interpretation/review of prior testing, medical history, examination, medication adjustment, communicating results with the patient directly, and documentation with the EHR is 45 minutes.   Justice Britain, MD [facility] Advanced Endoscopy Office # [phone]

## 2021-02-07 ENCOUNTER — Encounter: Payer: Self-pay | Admitting: Gastroenterology

## 2021-02-07 DIAGNOSIS — Z86010 Personal history of colonic polyps: Secondary | ICD-10-CM | POA: Insufficient documentation

## 2021-02-07 DIAGNOSIS — R198 Other specified symptoms and signs involving the digestive system and abdomen: Secondary | ICD-10-CM | POA: Insufficient documentation

## 2021-02-07 DIAGNOSIS — D3A8 Other benign neuroendocrine tumors: Secondary | ICD-10-CM | POA: Insufficient documentation

## 2021-02-07 DIAGNOSIS — C7A8 Other malignant neuroendocrine tumors: Secondary | ICD-10-CM | POA: Insufficient documentation

## 2021-02-07 DIAGNOSIS — R933 Abnormal findings on diagnostic imaging of other parts of digestive tract: Secondary | ICD-10-CM | POA: Insufficient documentation

## 2021-02-07 HISTORY — DX: Other malignant neuroendocrine tumors: C7A.8

## 2021-02-09 NOTE — Progress Notes (Signed)
02/10/2021 11:40 AM   Kevin Esparza May 08, 1961 [id number]  Referring provider: Ranae Plumber, [address] [place],  [place] [address]  Chief Complaint  Patient presents with  . Benign Prostatic Hypertrophy  . Erectile Dysfunction   Urological history: 1. ED -SHIM 19 -contributing factors of age, HTN, CHF, DM, HLD and smoking  2. BPH with LU TS -PSA 2.0 in 01/2021 -I PSS 11/1  3. Elevated PSA -PSA Trend  1.0 ng/mL on 01/16/2013   0.37  Free PSA    37%    1.3 ng/mL on 06/16/2014   0.5  Free PSA  38%  4.7 ng/mL on 08/05/2016   1.6  Free PSA  34%  3.3 ng/mL on 08/26/2016  2.8 ng/mL on 12/28/2016  1.9 ng/mL on 07/29/2017 Component     Latest Ref Rng & Units 08/26/2016 12/28/2016 07/29/2017 09/04/2018  Prostate Specific Ag, Serum     0.0 - 4.0 ng/mL 3.3 2.8 1.9 1.6   Component     Latest Ref Rng & Units 02/04/2020 08/04/2020 02/03/2021  Prostate Specific Ag, Serum     0.0 - 4.0 ng/mL 2.4 2.4 2.0    HPI: Kevin Esparza is a 60 y.o. male who presents today for yearly follow up.    He has urgency that is aggravated by diet cola.    Patient denies any modifying or aggravating factors.  Patient denies any gross hematuria, dysuria or suprapubic/flank pain.  Patient denies any fevers, chills, nausea or vomiting.     IPSS    Row Name 02/10/21 1100         International Prostate Symptom Score   How often have you had the sensation of not emptying your bladder? Less than 1 in 5     How often have you had to urinate less than every two hours? Less than half the time     How often have you found you stopped and started again several times when you urinated? About half the time     How often have you found it difficult to postpone urination? Less than 1 in 5 times     How often have you had a weak urinary stream? About half the time     How often have you had to strain to start urination? Less than 1 in 5 times     How many times did you typically get up at night  to urinate? None     Total IPSS Score 11           Quality of Life due to urinary symptoms   If you were to spend the rest of your life with your urinary condition just the way it is now how would you feel about that? Pleased            Score:  1-7 Mild 8-19 Moderate 20-35 Severe    Wife has arthritis and they are not very sexually active.    SHIM    Row Name 02/10/21 1116         SHIM: Over the last 6 months:   How do you rate your confidence that you could get and keep an erection? Moderate     When you had erections with sexual stimulation, how often were your erections hard enough for penetration (entering your partner)? Most Times (much more than half the time)     During sexual intercourse, how often were you able to maintain your erection after you had penetrated (entered) your  partner? Most Times (much more than half the time)     During sexual intercourse, how difficult was it to maintain your erection to completion of intercourse? Slightly Difficult     When you attempted sexual intercourse, how often was it satisfactory for you? Most Times (much more than half the time)           SHIM Total Score   SHIM 19            Score: 1-7 Severe ED 8-11 Moderate ED 12-16 Mild-Moderate ED 17-21 Mild ED 22-25 No ED  PMH: Past Medical History:  Diagnosis Date  . Anxiety   . Cancer ([place])   . CHF (congestive heart failure) ([place])   . Diabetes mellitus without complication ([place])   . ED (erectile dysfunction)   . Elevated PSA   . Heartburn   . History of BPH   . HLD (hyperlipidemia)   . HTN (hypertension)   . Pneumonia     Surgical History: Past Surgical History:  Procedure Laterality Date  . APPENDECTOMY  2005  . CERVICAL SPINE SURGERY  1997   c4-c7  . CHOLECYSTECTOMY  2005  . COLONOSCOPY WITH PROPOFOL N/A 12/31/2020   Procedure: COLONOSCOPY WITH PROPOFOL;  Surgeon: Virgel Manifold, MD;  Location: [facility] ENDOSCOPY;  Service: Endoscopy;  Laterality:  N/A;  . ESOPHAGOGASTRODUODENOSCOPY (EGD) WITH PROPOFOL N/A 12/31/2020   Procedure: ESOPHAGOGASTRODUODENOSCOPY (EGD) WITH PROPOFOL;  Surgeon: Virgel Manifold, MD;  Location: [facility] ENDOSCOPY;  Service: Endoscopy;  Laterality: N/A;  . TONSILLECTOMY      Home Medications:  Allergies as of 02/10/2021   No Known Allergies     Medication List       Accurate as of February 10, 2021 11:40 AM. If you have any questions, ask your nurse or doctor.        amLODipine 10 MG tablet Commonly known as: NORVASC Take 10 mg by mouth daily.   aspirin 81 MG chewable tablet Chew by mouth daily.   cyclobenzaprine 10 MG tablet Commonly known as: FLEXERIL Take 10 mg by mouth daily.   enalapril 5 MG tablet Commonly known as: VASOTEC Take 5 mg by mouth daily.   gabapentin 300 MG capsule Commonly known as: NEURONTIN Take 300 mg by mouth 3 (three) times daily.   metFORMIN 1000 MG tablet Commonly known as: GLUCOPHAGE Take 1,000 mg by mouth 2 (two) times daily with a meal.   metoprolol succinate 50 MG 24 hr tablet Commonly known as: TOPROL-XL Take 50 mg by mouth daily. Take with or immediately following a meal.   omeprazole 20 MG capsule Commonly known as: PRILOSEC Take 1 capsule (20 mg total) by mouth daily.   Plenvu 140 g Solr Generic drug: PEG-KCl-NaCl-NaSulf-Na Asc-C Take 1 kit by mouth as directed. Use coupon: BIN: [id number] PNC: CNRX Group: [id number] ID: [id number]   sildenafil 20 MG tablet Commonly known as: REVATIO Take 3 to 5 tablets two hours before intercouse on an empty stomach.  Do not take with nitrates.       Allergies: No Known Allergies  Family History: Family History  Problem Relation Age of Onset  . Kidney cancer Maternal Grandmother   . Liver cancer Maternal Grandmother   . Stroke Father   . Cancer Father        unknown origin  . Hypertension Mother   . Diabetes Mother   . Osteoarthritis Mother   . Osteoporosis Mother   . Lung cancer Maternal Grandfather   .  Kidney disease  Neg Hx   . Prostate cancer Neg Hx   . Bladder Cancer Neg Hx   . Colon cancer Neg Hx   . Esophageal cancer Neg Hx   . Inflammatory bowel disease Neg Hx   . Pancreatic cancer Neg Hx   . Rectal cancer Neg Hx   . Stomach cancer Neg Hx     Social History:  reports that he quit smoking about 16 years ago. His smoking use included cigarettes. He quit after 14.00 years of use. His smokeless tobacco use includes chew. He reports current alcohol use of about 1.0 standard drink of alcohol per week. He reports that he does not use drugs.  ROS: For pertinent review of systems please refer to history of present illness  Physical Exam: BP 128/69   Pulse 60   Ht $R'5\' 10"'qg$  (1.778 m)   Wt 186 lb (84.4 kg)   BMI 26.69 kg/m   Constitutional:  Well nourished. Alert and oriented, No acute distress. HEENT: [place] AT, mask in place.  Trachea midline Cardiovascular: No clubbing, cyanosis, or edema. Respiratory: Normal respiratory effort, no increased work of breathing. GU: No CVA tenderness.  No bladder fullness or masses.  Patient with circumcised phallus.  A tick bite is seen on the glans.  Urethral meatus is patent.  No penile discharge. No penile lesions or rashes. Scrotum without lesions, cysts, rashes and/or edema.  Testicles are located scrotally bilaterally. No masses are appreciated in the testicles. Left and right epididymis are normal. Rectal: Patient with  normal sphincter tone. Anus and perineum without scarring or rashes. No rectal masses are appreciated. Prostate is approximately 50 grams, no nodules are appreciated. Seminal vesicles could not be palpated.   Skin: No rashes, bruises or suspicious lesions. Lymph: No inguinal adenopathy. Neurologic: Grossly intact, no focal deficits, moving all 4 extremities. Psychiatric: Normal mood and affect.   Laboratory Data: Component     Latest Ref Rng & Units 01/30/2021  Sodium     135 - 145 mmol/L 140  Potassium     3.5 - 5.1 mmol/L 4.3   Chloride     98 - 111 mmol/L 105  CO2     22 - 32 mmol/L 27  Glucose     70 - 99 mg/dL 110 (H)  BUN     6 - 20 mg/dL 10  Creatinine     0.61 - 1.24 mg/dL 1.14  Calcium     8.9 - 10.3 mg/dL 9.3  Total Protein     6.5 - 8.1 g/dL 7.3  Albumin     3.5 - 5.0 g/dL 4.0  AST     15 - 41 U/L 25  ALT     0 - 44 U/L 30  Alkaline Phosphatase     38 - 126 U/L 64  Total Bilirubin     0.3 - 1.2 mg/dL 1.0  GFR, Estimated     >60 mL/min >60  Anion gap     5 - 15 8   Component     Latest Ref Rng & Units 01/30/2021  WBC     4.0 - 10.5 K/uL 6.8  RBC     4.22 - 5.81 MIL/uL 5.24  Hemoglobin     13.0 - 17.0 g/dL 15.8  HCT     39.0 - 52.0 % 45.1  MCV     80.0 - 100.0 fL 86.1  MCH     26.0 - 34.0 pg 30.2  MCHC     30.0 -  36.0 g/dL 35.0  RDW     11.5 - 15.5 % 13.3  Platelets     150 - 400 K/uL 159  nRBC     0.0 - 0.2 % 0.0  Neutrophils     % 48  NEUT#     1.7 - 7.7 K/uL 3.3  Lymphocytes     % 35  Lymphocyte #     0.7 - 4.0 K/uL 2.4  Monocytes Relative     % 9  Monocyte #     0.1 - 1.0 K/uL 0.6  Eosinophil     % 6  Eosinophils Absolute     0.0 - 0.5 K/uL 0.4  Basophil     % 1  Basophils Absolute     0.0 - 0.1 K/uL 0.0  Immature Granulocytes     % 1  Abs Immature Granulocytes     0.00 - 0.07 K/uL 0.06  WBC, UA     0 - 5 /hpf   Epithelial Cells (non renal)     0 - 10 /hpf   Bacteria, UA     None seen/Few   I have reviewed the labs.  Assessment & Plan:    1. BPH with LUTS - IPSS score has worsened  -continue conservative management, avoiding bladder irritants and timed voiding's -no bothersome symptoms   2. Erectile dysfunction -not sexually active at this time  Return in about 1 year (around 02/10/2022) for IPSS, SHIM, PSA and exam.  These notes generated with voice recognition software. I apologize for typographical errors.  Zara Council, PA-C  [facility] [address] [place], [place] [address] [phone]

## 2021-02-10 ENCOUNTER — Ambulatory Visit (INDEPENDENT_AMBULATORY_CARE_PROVIDER_SITE_OTHER): Payer: Medicare Other | Admitting: Urology

## 2021-02-10 ENCOUNTER — Other Ambulatory Visit: Payer: Self-pay

## 2021-02-10 ENCOUNTER — Other Ambulatory Visit: Payer: Self-pay | Admitting: Urology

## 2021-02-10 ENCOUNTER — Encounter: Payer: Self-pay | Admitting: Urology

## 2021-02-10 VITALS — BP 128/69 | HR 60 | Ht 70.0 in | Wt 186.0 lb

## 2021-02-10 DIAGNOSIS — N138 Other obstructive and reflux uropathy: Secondary | ICD-10-CM

## 2021-02-10 DIAGNOSIS — N529 Male erectile dysfunction, unspecified: Secondary | ICD-10-CM

## 2021-02-10 DIAGNOSIS — N401 Enlarged prostate with lower urinary tract symptoms: Secondary | ICD-10-CM

## 2021-02-10 LAB — URINALYSIS, COMPLETE
Bilirubin, UA: NEGATIVE
Glucose, UA: NEGATIVE
Ketones, UA: NEGATIVE
Leukocytes,UA: NEGATIVE
Nitrite, UA: NEGATIVE
Protein,UA: NEGATIVE
RBC, UA: NEGATIVE
Specific Gravity, UA: <1.005 — ABNORMAL LOW (ref 1.005–1.030)
Urobilinogen, Ur: 0.2 mg/dL (ref 0.2–1.0)
pH, UA: 5.5 (ref 5.0–7.5)

## 2021-02-10 LAB — MICROSCOPIC EXAMINATION
Bacteria, UA: NONE SEEN
RBC, Urine: NONE SEEN /hpf (ref 0–2)
WBC, UA: NONE SEEN /hpf (ref 0–5)

## 2021-02-18 ENCOUNTER — Encounter: Payer: Self-pay | Admitting: Gastroenterology

## 2021-02-18 ENCOUNTER — Ambulatory Visit (INDEPENDENT_AMBULATORY_CARE_PROVIDER_SITE_OTHER): Payer: Medicare Other | Admitting: Gastroenterology

## 2021-02-18 ENCOUNTER — Other Ambulatory Visit: Payer: Self-pay

## 2021-02-18 VITALS — BP 118/64 | HR 60 | Temp 97.5°F | Ht 70.0 in | Wt 187.0 lb

## 2021-02-18 DIAGNOSIS — D3A8 Other benign neuroendocrine tumors: Secondary | ICD-10-CM

## 2021-02-18 DIAGNOSIS — R0989 Other specified symptoms and signs involving the circulatory and respiratory systems: Secondary | ICD-10-CM

## 2021-02-18 DIAGNOSIS — R198 Other specified symptoms and signs involving the digestive system and abdomen: Secondary | ICD-10-CM

## 2021-02-18 DIAGNOSIS — K635 Polyp of colon: Secondary | ICD-10-CM

## 2021-02-18 NOTE — Progress Notes (Signed)
Melodie Bouillon, MD [address]  [address]  [place], [place] [address]  Main: [phone]  Fax: [phone]   Primary Care Physician: Shane Crutch, [place]   Chief complaint: Gastric tumor  HPI: Kevin Esparza is a 60 y.o. male here for follow-up of dysphagia.  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 Meridee Score.  Procedure is planned with Dr. Meridee Score in the upcoming weeks.  Patient was off PPI for a few weeks and reports globus sensation.  He has resumed the medication as of the last 3 to 4 days  The patient denies abdominal or flank pain, anorexia, nausea or vomiting, change in bowel habits or black or bloody stools or weight loss.   Previous history: EGD showed a Schatzki's ring that was dilated to 18 mm.  A nodule was seen on the lesser curvature of the stomach.  Another area of nodular mucosa is also seen the lesser curvature of the antrum.  Gastric erythema was noted.  Colonoscopy showed several polyps, 1 of which in the cecum was not removed due to poor visualization despite submucosal lift with injection.  Several other polyps were seen and removed as detailed in procedure report.  Biopsies from the upper endoscopy shows well-differentiated neuroendocrine tumor, G2 in lesser curvature of the stomach  Colon polyps showed tubular adenoma  Current Outpatient Medications  Medication Sig Dispense Refill  . amLODipine (NORVASC) 10 MG tablet Take 10 mg by mouth daily.    Marland Kitchen aspirin 81 MG chewable tablet Chew by mouth daily.    . cyclobenzaprine (FLEXERIL) 10 MG tablet Take 10 mg by mouth daily.    . enalapril (VASOTEC) 5 MG tablet Take 5 mg by mouth daily.    Marland Kitchen gabapentin (NEURONTIN) 300 MG capsule Take 300 mg by mouth 3 (three) times daily.    Marland Kitchen HYDROcodone-acetaminophen (NORCO/VICODIN) 5-325 MG tablet Take 1 tablet by mouth every 8 (eight) hours as needed.    . metFORMIN  (GLUCOPHAGE) 1000 MG tablet Take 1,000 mg by mouth 2 (two) times daily with a meal.    . metoprolol succinate (TOPROL-XL) 50 MG 24 hr tablet Take 50 mg by mouth daily. Take with or immediately following a meal.    . omeprazole (PRILOSEC) 20 MG capsule Take 1 capsule (20 mg total) by mouth daily. 30 capsule 0  . PEG-KCl-NaCl-NaSulf-Na Asc-C (PLENVU) 140 g SOLR Take 1 kit by mouth as directed. Use coupon: BIN: 388303 PNC: CNRX Group: [id number] ID: [id number] 1 each 0  . sildenafil (REVATIO) 20 MG tablet Take 3 to 5 tablets two hours before intercouse on an empty stomach.  Do not take with nitrates. (Patient taking differently: Take 3 to 5 tablets two hours before intercouse on an empty stomach.  Do not take with nitrates.) 50 tablet 3   No current facility-administered medications for this visit.    Allergies as of 02/18/2021  . (No Known Allergies)    ROS:  General: Negative for anorexia, weight loss, fever, chills, fatigue, weakness. ENT: Negative for hoarseness, difficulty swallowing , nasal congestion. CV: Negative for chest pain, angina, palpitations, dyspnea on exertion, peripheral edema.  Respiratory: Negative for dyspnea at rest, dyspnea on exertion, cough, sputum, wheezing.  GI: See history of present illness. GU:  Negative for dysuria, hematuria, urinary incontinence, urinary frequency, nocturnal urination.  Endo: Negative for unusual weight change.    Physical Examination:   BP 118/64   Pulse  60   Temp (!) 97.5 F (36.4 C) (Oral)   Ht $R'5\' 10"'tq$  (1.778 m)   Wt 187 lb (84.8 kg)   BMI 26.83 kg/m   General: Well-nourished, well-developed in no acute distress.  Eyes: No icterus. Conjunctivae pink. Mouth: Oropharyngeal mucosa moist and pink , no lesions erythema or exudate. Neck: Supple, Trachea midline Abdomen: Bowel sounds are normal, nontender, nondistended, no hepatosplenomegaly or masses, no abdominal bruits or hernia , no rebound or guarding.   Extremities: No lower  extremity edema. No clubbing or deformities. Neuro: Alert and oriented x 3.  Grossly intact. Skin: Warm and dry, no jaundice.   Psych: Alert and cooperative, normal mood and affect.   Labs: CMP     Component Value Date/Time   NA 140 01/30/2021 1328   NA 134 (L) 04/20/2014 1206   K 4.3 01/30/2021 1328   K 4.0 04/20/2014 1206   CL 105 01/30/2021 1328   CL 100 04/20/2014 1206   CO2 27 01/30/2021 1328   CO2 30 04/20/2014 1206   GLUCOSE 110 (H) 01/30/2021 1328   GLUCOSE 121 (H) 04/20/2014 1206   BUN 10 01/30/2021 1328   BUN 12 04/20/2014 1206   CREATININE 1.14 01/30/2021 1328   CREATININE 0.98 04/20/2014 1206   CALCIUM 9.3 01/30/2021 1328   CALCIUM 8.9 04/20/2014 1206   PROT 7.3 01/30/2021 1328   ALBUMIN 4.0 01/30/2021 1328   AST 25 01/30/2021 1328   ALT 30 01/30/2021 1328   ALKPHOS 64 01/30/2021 1328   BILITOT 1.0 01/30/2021 1328   GFRNONAA >60 01/30/2021 1328   GFRNONAA >60 04/20/2014 1206   GFRAA >60 03/14/2018 1148   GFRAA >60 04/20/2014 1206   Lab Results  Component Value Date   WBC 6.8 01/30/2021   HGB 15.8 01/30/2021   HCT 45.1 01/30/2021   MCV 86.1 01/30/2021   PLT 159 01/30/2021    Imaging Studies: CT Abdomen W Contrast  Result Date: 01/28/2021 CLINICAL DATA:  Gastric neuroendocrine tumor. EXAM: CT ABDOMEN WITH CONTRAST TECHNIQUE: Multidetector CT imaging of the abdomen was performed using the standard protocol following bolus administration of intravenous contrast. CONTRAST:  147mL OMNIPAQUE IOHEXOL 300 MG/ML  SOLN COMPARISON:  CT 02/07/2007 FINDINGS: Lower chest:  Lung bases are clear. Hepatobiliary: No focal hepatic lesion. Postcholecystectomy. No biliary dilatation. Pancreas: Normal pancreatic parenchymal intensity. No ductal dilatation or inflammation. Spleen: Normal spleen. Adrenals/urinary tract: Nodule extending medially from the LEFT adrenal gland measures 8 mm (image 27/2). Lesion does not meet washout characteristics for adenoma on this nondedicated  scan. Nodule was present on CT scan from 2008 measuring 5 mm. RIGHT adrenal gland normal. Kidneys normal. Stomach/Bowel: Stomach appears normal. There is no mucosal or submucosal lesion identified by CT imaging. No gastric outlet obstruction. Duodenum limited view of the bowel is unremarkable. No gastrohepatic ligament adenopathy. No periportal adenopathy. No upper abdominal retroperitoneal adenopathy. Vascular/Lymphatic: Calcification of the abdominal aorta. No periaortic adenopathy. Musculoskeletal: No aggressive osseous lesion IMPRESSION: 1. No gastric mucosal or submucosal lesion identified. 2. No upper abdominal lymphadenopathy. 3. Enlarging nodule LEFT adrenal gland slowly increased in size from 2008. Recommend dedicated MRI with contrast renal protocol for further characterization. Electronically Signed   By: Suzy Bouchard M.D.   On: 01/28/2021 09:18    Assessment and Plan:   Kevin Esparza is a 60 y.o. y/o male here for follow-up of dysphagia, gastric neuroendocrine tumor, colon polyps  Dysphagia resolved, but patient reports globus sensation over the last 3 to 4 days.  Since he was  off PPI for a few weeks, if symptoms continue despite PPI therapy that he restarted over the last 3 to 4 days, I have advised him to let us know  Continue follow-up with Dr. Rush Landmark for gastric neuroendocrine tumor removal of cecal polyp  Follow-up in our after his procedures with Dr. Rush Landmark  Patient educated extensively on acid reflux lifestyle modification, including buying a bed wedge, not eating 3 hrs before bedtime, diet modifications, and handout given for the same.   (Risks of PPI use were discussed with patient including bone loss, C. Diff diarrhea, pneumonia, infections, CKD, electrolyte abnormalities.  Pt. Verbalizes understanding and chooses to continue the medication.)  Chromogranin levels reviewed and were normal  Dr [name]

## 2021-02-27 NOTE — Addendum Note (Signed)
Encounter addended by: Annie Paras on: 02/27/2021 4:54 PM  Actions taken: Letter saved

## 2021-03-16 ENCOUNTER — Other Ambulatory Visit
Admission: RE | Admit: 2021-03-16 | Discharge: 2021-03-16 | Disposition: A | Discharge status: Home / Self Care | Payer: Medicare Other | Source: Ambulatory Visit | Attending: Gastroenterology | Admitting: Gastroenterology

## 2021-03-16 ENCOUNTER — Other Ambulatory Visit: Payer: Self-pay

## 2021-03-16 DIAGNOSIS — Z01812 Encounter for preprocedural laboratory examination: Secondary | ICD-10-CM | POA: Diagnosis present

## 2021-03-16 DIAGNOSIS — Z20822 Contact with and (suspected) exposure to covid-19: Secondary | ICD-10-CM | POA: Insufficient documentation

## 2021-03-17 LAB — SARS CORONAVIRUS 2 (TAT 6-24 HRS): SARS Coronavirus 2: NEGATIVE

## 2021-03-18 ENCOUNTER — Encounter (HOSPITAL_COMMUNITY): Payer: Self-pay | Admitting: Gastroenterology

## 2021-03-18 ENCOUNTER — Other Ambulatory Visit: Payer: Self-pay

## 2021-03-18 NOTE — Anesthesia Preprocedure Evaluation (Addendum)
Anesthesia Evaluation  Patient identified by MRN, date of birth, ID band Patient awake    Reviewed: Allergy & Precautions, NPO status , Patient's Chart, lab work & pertinent test results  Airway Mallampati: II  TM Distance: >3 FB     Dental   Pulmonary pneumonia, Patient abstained from smoking., former smoker,    breath sounds clear to auscultation       Cardiovascular hypertension, +CHF   Rhythm:Regular Rate:Normal     Neuro/Psych Anxiety    GI/Hepatic Neg liver ROS, GERD  ,  Endo/Other  diabetes  Renal/GU negative Renal ROS     Musculoskeletal   Abdominal   Peds  Hematology   Anesthesia Other Findings   Reproductive/Obstetrics                            Anesthesia Physical Anesthesia Plan  ASA: III  Anesthesia Plan: MAC   Post-op Pain Management:    Induction:   PONV Risk Score and Plan: 1 and Propofol infusion  Airway Management Planned: Nasal Cannula and Simple Face Mask  Additional Equipment:   Intra-op Plan:   Post-operative Plan:   Informed Consent: I have reviewed the patients History and Physical, chart, labs and discussed the procedure including the risks, benefits and alternatives for the proposed anesthesia with the patient or authorized representative who has indicated his/her understanding and acceptance.     Dental advisory given  Plan Discussed with: CRNA and Anesthesiologist  Anesthesia Plan Comments:        Anesthesia Quick Evaluation

## 2021-03-18 NOTE — Progress Notes (Signed)
Patient denies shortness of breath, fever, cough or chest pain.  PCP - Dr Daryel Gerald Cardiologist - n/a  Chest x-ray - n/a EKG - DOS 03/19/21 Stress Test - 08/12/05 ECHO - n/a Cardiac Cath - n/a  Fasting Blood Sugar - unknown Checks Blood Sugar occasional checks   . Do not take metformin on the morning of surgery. . . If your blood sugar is less than 70 mg/dL, you will need to treat for low blood sugar: o Treat a low blood sugar (less than 70 mg/dL) with  cup of clear juice (cranberry or apple), 4 glucose tablets, OR glucose gel. o Recheck blood sugar in 15 minutes after treatment (to make sure it is greater than 70 mg/dL). If your blood sugar is not greater than 70 mg/dL on recheck, call [phone] for further instructions.  Aspirin Instructions: Follow your surgeon's instructions on when to stop aspirin prior to surgery,  If no instructions were given by your surgeon then you will need to call the office for those instructions.  Anesthesia review: Yes  STOP now taking any Aspirin (unless otherwise instructed by your surgeon), Aleve, Naproxen, Ibuprofen, Motrin, Advil, Goody's, BC's, all herbal medications, fish oil, and all vitamins.   Coronavirus Screening Covid test on 03/16/21 was negative.  Patient verbalized understanding of instructions that were given via phone.

## 2021-03-19 ENCOUNTER — Encounter (HOSPITAL_COMMUNITY): Payer: Self-pay | Admitting: Gastroenterology

## 2021-03-19 ENCOUNTER — Encounter (HOSPITAL_COMMUNITY)
Admission: RE | Disposition: A | Discharge status: Home / Self Care | Payer: Self-pay | Source: Home / Self Care | Attending: Gastroenterology

## 2021-03-19 ENCOUNTER — Ambulatory Visit (HOSPITAL_COMMUNITY)
Admission: RE | Admit: 2021-03-19 | Discharge: 2021-03-19 | Disposition: A | Discharge status: Home / Self Care | Payer: Medicare Other | Attending: Gastroenterology | Admitting: Gastroenterology

## 2021-03-19 ENCOUNTER — Ambulatory Visit (HOSPITAL_COMMUNITY): Payer: Medicare Other | Admitting: Physician Assistant

## 2021-03-19 ENCOUNTER — Other Ambulatory Visit: Payer: Self-pay

## 2021-03-19 DIAGNOSIS — K3189 Other diseases of stomach and duodenum: Secondary | ICD-10-CM | POA: Diagnosis not present

## 2021-03-19 DIAGNOSIS — Z87891 Personal history of nicotine dependence: Secondary | ICD-10-CM | POA: Insufficient documentation

## 2021-03-19 DIAGNOSIS — K449 Diaphragmatic hernia without obstruction or gangrene: Secondary | ICD-10-CM | POA: Insufficient documentation

## 2021-03-19 DIAGNOSIS — D3A8 Other benign neuroendocrine tumors: Secondary | ICD-10-CM

## 2021-03-19 DIAGNOSIS — D490 Neoplasm of unspecified behavior of digestive system: Secondary | ICD-10-CM

## 2021-03-19 DIAGNOSIS — D123 Benign neoplasm of transverse colon: Secondary | ICD-10-CM | POA: Diagnosis not present

## 2021-03-19 DIAGNOSIS — K635 Polyp of colon: Secondary | ICD-10-CM

## 2021-03-19 DIAGNOSIS — K641 Second degree hemorrhoids: Secondary | ICD-10-CM | POA: Insufficient documentation

## 2021-03-19 DIAGNOSIS — K222 Esophageal obstruction: Secondary | ICD-10-CM | POA: Insufficient documentation

## 2021-03-19 DIAGNOSIS — D120 Benign neoplasm of cecum: Secondary | ICD-10-CM | POA: Insufficient documentation

## 2021-03-19 DIAGNOSIS — Z86010 Personal history of colonic polyps: Secondary | ICD-10-CM

## 2021-03-19 HISTORY — PX: ESOPHAGOGASTRODUODENOSCOPY (EGD) WITH PROPOFOL: SHX5813

## 2021-03-19 HISTORY — PX: HEMOSTASIS CLIP PLACEMENT: SHX6857

## 2021-03-19 HISTORY — PX: COLONOSCOPY WITH PROPOFOL: SHX5780

## 2021-03-19 HISTORY — DX: Gastro-esophageal reflux disease without esophagitis: K21.9

## 2021-03-19 HISTORY — PX: POLYPECTOMY: SHX5525

## 2021-03-19 HISTORY — DX: Unspecified osteoarthritis, unspecified site: M19.90

## 2021-03-19 HISTORY — PX: SUBMUCOSAL LIFTING INJECTION: SHX6855

## 2021-03-19 HISTORY — PX: UPPER ESOPHAGEAL ENDOSCOPIC ULTRASOUND (EUS): SHX6562

## 2021-03-19 HISTORY — PX: BIOPSY: SHX5522

## 2021-03-19 HISTORY — PX: ENDOSCOPIC MUCOSAL RESECTION: SHX6839

## 2021-03-19 LAB — POCT I-STAT, CHEM 8
BUN: 10 mg/dL (ref 8–23)
Calcium, Ion: 1.28 mmol/L (ref 1.15–1.40)
Chloride: 109 mmol/L (ref 98–111)
Creatinine, Ser: 1 mg/dL (ref 0.61–1.24)
Glucose, Bld: 116 mg/dL — ABNORMAL HIGH (ref 70–99)
HCT: 41 % (ref 39.0–52.0)
Hemoglobin: 13.9 g/dL (ref 13.0–17.0)
Potassium: 3.9 mmol/L (ref 3.5–5.1)
Sodium: 143 mmol/L (ref 135–145)
TCO2: 22 mmol/L (ref 22–32)

## 2021-03-19 SURGERY — COLONOSCOPY WITH PROPOFOL
Anesthesia: Monitor Anesthesia Care

## 2021-03-19 MED ORDER — LACTATED RINGERS IV SOLN: INTRAVENOUS | Status: DC | PRN

## 2021-03-19 MED ORDER — LIDOCAINE 2% (20 MG/ML) 5 ML SYRINGE
INTRAMUSCULAR | Status: DC | PRN
  Administered 2021-03-19: 40 mg via INTRAVENOUS

## 2021-03-19 MED ORDER — PROPOFOL 500 MG/50ML IV EMUL
INTRAVENOUS | Status: DC | PRN
  Administered 2021-03-19: 150 ug/kg/min via INTRAVENOUS

## 2021-03-19 MED ORDER — SODIUM CHLORIDE 0.9 % IV SOLN: INTRAVENOUS | Status: DC

## 2021-03-19 MED ORDER — LACTATED RINGERS IV SOLN: Freq: Once | INTRAVENOUS | Status: AC

## 2021-03-19 MED ORDER — PROPOFOL 10 MG/ML IV BOLUS
INTRAVENOUS | Status: DC | PRN
  Administered 2021-03-19: 30 mg via INTRAVENOUS
  Administered 2021-03-19: 20 mg via INTRAVENOUS
  Administered 2021-03-19: 30 mg via INTRAVENOUS
  Administered 2021-03-19: 20 mg via INTRAVENOUS
  Administered 2021-03-19: 30 mg via INTRAVENOUS

## 2021-03-19 MED ORDER — ASPIRIN 81 MG PO TBEC: 81.0000 mg | DELAYED_RELEASE_TABLET | Freq: Every day | ORAL | 12 refills | Status: DC

## 2021-03-19 SURGICAL SUPPLY — 22 items

## 2021-03-19 NOTE — Anesthesia Postprocedure Evaluation (Signed)
Anesthesia Post Note  Patient: Kevin Esparza  Procedure(s) Performed: COLONOSCOPY WITH PROPOFOL (N/A ) UPPER ESOPHAGEAL ENDOSCOPIC ULTRASOUND (EUS) (N/A ) BIOPSY ENDOSCOPIC MUCOSAL RESECTION HOT HEMOSTASIS (ARGON PLASMA COAGULATION/BICAP) (N/A ) POLYPECTOMY     Patient location during evaluation: Endoscopy Anesthesia Type: MAC Level of consciousness: awake Pain management: pain level controlled Vital Signs Assessment: post-procedure vital signs reviewed and stable Respiratory status: spontaneous breathing Cardiovascular status: stable Postop Assessment: no apparent nausea or vomiting Anesthetic complications: no   No complications documented.  Last Vitals:  Vitals:   03/19/21 1000 03/19/21 1010  BP: 91/61 (!) 118/55  Pulse: 65 (!) 59  Resp: 11 13  Temp:    SpO2: 100% 100%    Last Pain:  Vitals:   03/19/21 1010  TempSrc:   PainSc: 0-No pain                 Zorawar Strollo

## 2021-03-19 NOTE — Transfer of Care (Signed)
Immediate Anesthesia Transfer of Care Note  Patient: Kevin Esparza  Procedure(s) Performed: COLONOSCOPY WITH PROPOFOL (N/A ) UPPER ESOPHAGEAL ENDOSCOPIC ULTRASOUND (EUS) (N/A ) BIOPSY ENDOSCOPIC MUCOSAL RESECTION HOT HEMOSTASIS (ARGON PLASMA COAGULATION/BICAP) (N/A ) POLYPECTOMY  Patient Location: Endoscopy Unit  Anesthesia Type:MAC  Level of Consciousness: awake, alert  and oriented  Airway & Oxygen Therapy: Patient Spontanous Breathing and Patient connected to face mask oxygen  Post-op Assessment: Report given to RN and Post -op Vital signs reviewed and stable  Post vital signs: Reviewed and stable  Last Vitals:  Vitals Value Taken Time  BP 91/61 03/19/21 0958  Temp    Pulse 65 03/19/21 1000  Resp 11 03/19/21 1000  SpO2 100 % 03/19/21 1000  Vitals shown include unvalidated device data.  Last Pain:  Vitals:   03/19/21 0700  TempSrc: Oral  PainSc: 0-No pain         Complications: No complications documented.

## 2021-03-19 NOTE — Discharge Instructions (Signed)
Colonoscopy, Adult, Care After This sheet gives you information about how to care for yourself after your procedure. Your doctor may also give you more specific instructions. If you have problems or questions, call your doctor. What can I expect after the procedure? After the procedure, it is common to have:  A small amount of blood in your poop (stool) for 24 hours.  Some gas.  Mild cramping or bloating in your belly (abdomen). Follow these instructions at home: Eating and drinking  Drink enough fluid to keep your pee (urine) pale yellow.  Follow instructions from your doctor about what you cannot eat or drink.  Return to your normal diet as told by your doctor. Avoid heavy or fried foods that are hard to digest.   Activity  Rest as told by your doctor.  Do not sit for a long time without moving. Get up to take short walks every 1-2 hours. This is important. Ask for help if you feel weak or unsteady.  Return to your normal activities as told by your doctor. Ask your doctor what activities are safe for you. To help cramping and bloating:  Try walking around.  Put heat on your belly as told by your doctor. Use the heat source that your doctor recommends, such as a moist heat pack or a heating pad. ? Put a towel between your skin and the heat source. ? Leave the heat on for 20-30 minutes. ? Remove the heat if your skin turns bright red. This is very important if you are unable to feel pain, heat, or cold. You may have a greater risk of getting burned.   General instructions  If you were given a medicine to help you relax (sedative) during your procedure, it can affect you for many hours. Do not drive or use machinery until your doctor says that it is safe.  For the first 24 hours after the procedure: ? Do not sign important documents. ? Do not drink alcohol. ? Do your daily activities more slowly than normal. ? Eat foods that are soft and easy to digest.  Take  over-the-counter or prescription medicines only as told by your doctor.  Keep all follow-up visits as told by your doctor. This is important. Contact a doctor if:  You have blood in your poop 2-3 days after the procedure. Get help right away if:  You have more than a small amount of blood in your poop.  You see large clumps of tissue (blood clots) in your poop.  Your belly is swollen.  You feel like you may vomit (nauseous).  You vomit.  You have a fever.  You have belly pain that gets worse, and medicine does not help your pain. Summary  After the procedure, it is common to have a small amount of blood in your poop. You may also have mild cramping and bloating in your belly.  If you were given a medicine to help you relax (sedative) during your procedure, it can affect you for many hours. Do not drive or use machinery until your doctor says that it is safe.  Get help right away if you have a lot of blood in your poop, feel like you may vomit, have a fever, or have more belly pain. This information is not intended to replace advice given to you by your health care provider. Make sure you discuss any questions you have with your health care provider. Document Revised: 08/24/2019 Document Reviewed: 05/14/2019 Elsevier Patient Education  2021  [employer] Upper Endoscopy, Adult, Care After This sheet gives you information about how to care for yourself after your procedure. Your health care provider may also give you more specific instructions. If you have problems or questions, contact your health care provider. What can I expect after the procedure? After the procedure, it is common to have:  A sore throat.  Mild stomach pain or discomfort.  Bloating.  Nausea. Follow these instructions at home:  Follow instructions from your health care provider about what to eat or drink after your procedure.  Return to your normal activities as told by your health care provider. Ask  your health care provider what activities are safe for you.  Take over-the-counter and prescription medicines only as told by your health care provider.  If you were given a sedative during the procedure, it can affect you for several hours. Do not drive or operate machinery until your health care provider says that it is safe.  Keep all follow-up visits as told by your health care provider. This is important.   Contact a health care provider if you have:  A sore throat that lasts longer than one day.  Trouble swallowing. Get help right away if:  You vomit blood or your vomit looks like coffee grounds.  You have: ? A fever. ? Bloody, black, or tarry stools. ? A severe sore throat or you cannot swallow. ? Difficulty breathing. ? Severe pain in your chest or abdomen. Summary  After the procedure, it is common to have a sore throat, mild stomach discomfort, bloating, and nausea.  If you were given a sedative during the procedure, it can affect you for several hours. Do not drive or operate machinery until your health care provider says that it is safe.  Follow instructions from your health care provider about what to eat or drink after your procedure.  Return to your normal activities as told by your health care provider. This information is not intended to replace advice given to you by your health care provider. Make sure you discuss any questions you have with your health care provider. Document Revised: 10/16/2019 Document Reviewed: 03/20/2018 Elsevier Patient Education  2021 [employer]  

## 2021-03-19 NOTE — Op Note (Signed)
[facility] Patient Name: Kevin Esparza Procedure Date : 03/19/2021 MRN: [id number] Attending MD: Justice Britain , MD Date of Birth: May 08, 1961 CSN: [id number] Age: 60 Admit Type: Outpatient Procedure:                Upper EUS Indications:              Gastric deformity on endoscopy/Subepithelial tumor                            versus extrinsic compression, Neuroendocrine Tumor Providers:                Justice Britain, MD, Baird Cancer, RN, Janie                            Billups, Technician, [facility],                            Murriel Hopper CRNA Referring MD:             Lennette Bihari. Bonna Gains MD, MD Medicines:                Monitored Anesthesia Care Complications:            No immediate complications. Estimated Blood Loss:     Estimated blood loss was minimal. Procedure:                Pre-Anesthesia Assessment:                           - Prior to the procedure, a History and Physical                            was performed, and patient medications and                            allergies were reviewed. The patient's tolerance of                            previous anesthesia was also reviewed. The risks                            and benefits of the procedure and the sedation                            options and risks were discussed with the patient.                            All questions were answered, and informed consent                            was obtained. Prior Anticoagulants: The patient has                            taken no previous anticoagulant or antiplatelet  agents except for aspirin. ASA Grade Assessment:                            III - A patient with severe systemic disease. After                            reviewing the risks and benefits, the patient was                            deemed in satisfactory condition to undergo the                            procedure.                            After obtaining informed consent, the endoscope was                            passed under direct vision. Throughout the                            procedure, the patient's blood pressure, pulse, and                            oxygen saturations were monitored continuously. The                            [id number] Olympus therapeutic                            gastroscope was introduced through the mouth, and                            advanced to the second part of duodenum. The                            [id number]) Olympus Radial EUS scope was                            introduced through the mouth, and advanced to the                            duodenum for ultrasound examination from the                            stomach and duodenum. The upper EUS was somewhat                            difficult due to poor endoscopic visualization. The                            patient tolerated the procedure. Scope In: Scope Out: Findings:      ENDOSCOPIC FINDING: :      No gross mucosal lesions were noted  in the entire esophagus.      The Z-line was regular and was found 40 cm from the incisors.      A non-obstructing Schatzki ring was found at the gastroesophageal       junction.      A 2 cm hiatal hernia was present.      A medium amount of food (residue) was found in the entire examined       stomach. Suction via Endoscope was performed with some improvement in       visualization but complete visualization was not possible.      Patchy mildly erythematous mucosa without bleeding was found in the       entire examined stomach.      An area of nodular mucosa was found on the lesser curvature of the       gastric body - not clearly the same lesion that was biopsied and       returned as NET. Biopsies were taken with a cold forceps for histology.      Nodular mucosa was found on the lesser curvature of the gastric antrum -       not clearly the same lesion  that was biopsied and returned as NET.       Biopsies were taken with a cold forceps for histology.      A small, submucosal lesion with no bleeding was found at the incisura.      No gross lesions were noted in the duodenal bulb, in the first portion       of the duodenum and in the second portion of the duodenum.      Impairment of visualization noted by the foodstuffs however could have       led to not seeing lesion but we spent a significant portion of time       trying to evaluate the entirety of the stomach lining.      ENDOSONOGRAPHIC FINDING: :      Endosonographic imaging in the entire stomach showed no wall thickening.       With the caveat that impairment of visualization noted by the foodstuffs       however could have led to missing subtle lesion of the wall or       significant thickening due to the foodstuffs being in the way of the       echoendoscope.      Endosonographic imaging in the visualized portion of the liver showed no       mass.      No malignant-appearing lymph nodes were visualized in the left gastric       region (level 17), celiac region (level 20) and perigastric region.      The celiac region was visualized. Impression:               EGD Impression:                           - No gross mucosal lesions in esophagus. Z-line                            regular, 40 cm from the incisors. Non-obstructing                            Schatzki ring.                           -  2 cm hiatal hernia.                           - A medium amount of food (residue) in the stomach                            - suctioned and lavaged with some adequacy but                            still complete visualzation not possible.                           - Erythematous mucosa in the stomach.                           - Nodular mucosa in the gastric body (lesser                            curvature) - not clearly the same lesion that was                            biopsied and  returned as NET. Biopsied.                           - Nodular mucosa in the gastric antrum (lesser                            curvature) - not clearly the same lesion that was                            biopsied and returned as NET. Biopsied.                           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                           - No gross lesions in the duodenal bulb, in the                            first portion of the duodenum and in the second                            portion of the duodenum.                           EUS Impression:                           - Endosonographic imaging in the entire stomach                            showed no wall thickening. With the caveat that  impairment of visualization noted by the foodstuffs                            however could have led to missing subtle lesion of                            the wall or significant thickening due to the                            foodstuffs being in the way of the echoendoscope.                           - No malignant-appearing lymph nodes were                            visualized in the left gastric region (level 17),                            celiac region (level 20) and perigastric region. Recommendation:           - Proceed to scheduled colonoscopy.                           - Observe patient's clinical course.                           - Await path results.                           - Recommend repeat EGD/EUS in 3-6 months for                            re-evaluation of the entirety of the stomach                            lining. Clear liquids the entire day prior. Will                            need to be NPO except for medications on AM of                            procedure. Will likely give Reglan x 2-doses to                            help with clean out of stomach as well.                           - If pathology does show either of the lesions as                             NET, then I will feel more comfortable and proceed  with resection.                           - If neither return as NET and I cannot                            re-visualize anything, we will need to monitor the                            area closely over the next few years - it is                            possible due to the initial size that this was                            removed completely.                           - The findings and recommendations were discussed                            with the patient.                           - The findings and recommendations were discussed                            with the patient's family. Procedure Code(s):        --- Professional ---                           [phone], Esophagogastroduodenoscopy, flexible,                            transoral; with endoscopic ultrasound examination                            limited to the esophagus, stomach or duodenum, and                            adjacent structures                           43239, Esophagogastroduodenoscopy, flexible,                            transoral; with biopsy, single or multiple Diagnosis Code(s):        --- Professional ---                           K22.2, Esophageal obstruction                           K44.9, Diaphragmatic hernia without obstruction or  gangrene                           K31.89, Other diseases of stomach and duodenum                           D49.0, Neoplasm of unspecified behavior of                            digestive system                           I89.9, Noninfective disorder of lymphatic vessels                            and lymph nodes, unspecified CPT copyright 2019 American Medical Association. All rights reserved. The codes documented in this report are preliminary and upon coder review may  be revised to meet current compliance requirements. Justice Britain, MD 03/19/2021 10:16:59 AM Number of Addenda: 0

## 2021-03-19 NOTE — H&P (Signed)
GASTROENTEROLOGY PROCEDURE H&P NOTE   Primary Care Physician: Ranae Plumber, PA  HPI: Rosalie Gelpi is a 60 y.o. 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  Past Medical History:  Diagnosis Date  . Anxiety   . Arthritis    knees, hands  . Cancer ([place])   . CHF (congestive heart failure) ([place])   . Diabetes mellitus without complication ([place])    type 2  . ED (erectile dysfunction)   . Elevated PSA   . GERD (gastroesophageal reflux disease)   . Heartburn   . History of BPH   . HLD (hyperlipidemia)   . HTN (hypertension)   . Pneumonia    several times   Past Surgical History:  Procedure Laterality Date  . APPENDECTOMY  2005  . CERVICAL SPINE SURGERY  1997   c4-c7  . CHOLECYSTECTOMY  2005  . COLONOSCOPY WITH PROPOFOL N/A 12/31/2020   Procedure: COLONOSCOPY WITH PROPOFOL;  Surgeon: Virgel Manifold, MD;  Location: [facility] ENDOSCOPY;  Service: Endoscopy;  Laterality: N/A;  . ESOPHAGOGASTRODUODENOSCOPY (EGD) WITH PROPOFOL N/A 12/31/2020   Procedure: ESOPHAGOGASTRODUODENOSCOPY (EGD) WITH PROPOFOL;  Surgeon: Virgel Manifold, MD;  Location: [facility] ENDOSCOPY;  Service: Endoscopy;  Laterality: N/A;  . MULTIPLE TOOTH EXTRACTIONS     dentures  . TONSILLECTOMY     Current Facility-Administered Medications  Medication Dose Route Frequency Provider Last Rate Last Admin  . 0.9 %  sodium chloride infusion   Intravenous Continuous Mansouraty, Telford Nab., MD       No Known Allergies Family History  Problem Relation Age of Onset  . Kidney cancer Maternal Grandmother   . Liver cancer Maternal Grandmother   . Stroke Father   . Cancer Father        unknown origin  . Hypertension Mother   . Diabetes Mother   . Osteoarthritis Mother   . Osteoporosis Mother   . Lung cancer Maternal Grandfather   . Kidney disease Neg Hx   . Prostate cancer Neg Hx   . Bladder Cancer Neg Hx   . Colon  cancer Neg Hx   . Esophageal cancer Neg Hx   . Inflammatory bowel disease Neg Hx   . Pancreatic cancer Neg Hx   . Rectal cancer Neg Hx   . Stomach cancer Neg Hx    Social History   Socioeconomic History  . Marital status: Married    Spouse name: Not on file  . Number of children: 3  . Years of education: Not on file  . Highest education level: Not on file  Occupational History  . Not on file  Tobacco Use  . Smoking status: Former Smoker    Years: 14.00    Types: Cigarettes    Quit date: 03/05/2004    Years since quitting: 17.0  . Smokeless tobacco: Current User    Types: Chew  Vaping Use  . Vaping Use: Never used  Substance and Sexual Activity  . Alcohol use: Not Currently    Alcohol/week: 1.0 standard drink    Types: 1 Cans of beer per week    Comment: occaionally  . Drug use: Yes    Types: Marijuana    Comment: Last use 03/14/21  . Sexual activity: Yes  Other Topics Concern  . Not on file  Social History Narrative  . Not on file   Social Determinants of Health   Financial Resource Strain: Not on file  Food Insecurity: Not on file  Transportation Needs: Not on file  Physical Activity: Not on file  Stress: Not on file  Social Connections: Not on file  Intimate Partner Violence: Not on file    Physical Exam: Vital signs in last 24 hours: Temp:  [98.5 F (36.9 C)] 98.5 F (36.9 C) (05/19 0700) Pulse Rate:  [62] 62 (05/19 0700) Resp:  [16] 16 (05/19 0700) BP: (133)/(66) 133/66 (05/19 0700) SpO2:  [97 %] 97 % (05/19 0700) Weight:  [84.4 kg] 84.4 kg (05/18 0829)   GEN: NAD EYE: Sclerae anicteric ENT: MMM CV: Non-tachycardic GI: Soft, NT/ND NEURO:  Alert & Oriented x 3  Lab Results: Recent Labs    03/19/21 0719  HGB 13.9  HCT 41.0   BMET Recent Labs    03/19/21 0719  NA 143  K 3.9  CL 109  GLUCOSE 116*  BUN 10  CREATININE 1.00   LFT No results for input(s): PROT, ALBUMIN, AST, ALT, ALKPHOS, BILITOT, BILIDIR, IBILI in the last 72  hours. PT/INR No results for input(s): LABPROT, INR in the last 72 hours.   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  The risks of an EUS including intestinal perforation, bleeding, infection, aspiration, and medication effects were discussed as was the possibility it may not give a definitive diagnosis if a biopsy is performed.  When a biopsy of the pancreas is done as part of the EUS, there is an additional risk of pancreatitis at the rate of about 1-2%.  It was explained that procedure related pancreatitis is typically mild, although it can be severe and even life threatening, which is why we do not perform random pancreatic biopsies and only biopsy a lesion/area we feel is concerning enough to warrant the risk.   The risks and benefits of endoscopic evaluation were discussed with the patient; these include but are not limited to the risk of perforation, infection, bleeding, missed lesions, lack of diagnosis, severe illness requiring hospitalization, as well as anesthesia and sedation related illnesses.  The patient is agreeable to proceed.    Justice Britain, MD [facility] Office # [phone]

## 2021-03-19 NOTE — Op Note (Signed)
[facility] Patient Name: Kevin Esparza Procedure Date : 03/19/2021 MRN: [id number] Attending MD: Justice Britain , MD Date of Birth: 12/30/1960 CSN: [id number] Age: 60 Admit Type: Outpatient Procedure:                Colonoscopy Indications:              Excision of colonic polyp Providers:                Justice Britain, MD, Baird Cancer, RN, Janie                            Billups, Technician, Cletis [place], Technician Referring MD:             Lennette Bihari. Bonna Gains MD, MD Medicines:                Monitored Anesthesia Care Complications:            No immediate complications. Estimated Blood Loss:     Estimated blood loss was minimal. Procedure:                Pre-Anesthesia Assessment:                           - Prior to the procedure, a History and Physical                            was performed, and patient medications and                            allergies were reviewed. The patient's tolerance of                            previous anesthesia was also reviewed. The risks                            and benefits of the procedure and the sedation                            options and risks were discussed with the patient.                            All questions were answered, and informed consent                            was obtained. Prior Anticoagulants: The patient has                            taken no previous anticoagulant or antiplatelet                            agents except for aspirin. ASA Grade Assessment:                            III - A patient with severe systemic disease. After  reviewing the risks and benefits, the patient was                            deemed in satisfactory condition to undergo the                            procedure.                           After obtaining informed consent, the colonoscope                            was passed under direct vision. Throughout the                             procedure, the patient's blood pressure, pulse, and                            oxygen saturations were monitored continuously. The                            [id number] ([id number] Olympus colonoscope was                            introduced through the anus and advanced to the 5                            cm into the ileum. The [id number]) Olympus                            therapeutic gastroscope was introduced through the                            and advanced to the. The colonoscopy was somewhat                            difficult due to significant looping. Successful                            completion of the procedure was aided by performing                            the maneuvers documented (below) in this report.                            The patient tolerated the procedure. The quality of                            the bowel preparation was adequate. The terminal                            ileum, ileocecal valve, appendiceal orifice, and  rectum were photographed. Scope In: 8:52:27 AM Scope Out: 9:50:30 AM Scope Withdrawal Time: 0 hours 42 minutes 26 seconds  Total Procedure Duration: 0 hours 58 minutes 3 seconds  Findings:      The digital rectal exam findings include hemorrhoids. Pertinent       negatives include no palpable rectal lesions.      The terminal ileum and ileocecal valve appeared normal.      A 25 mm polyp was found in the cecum. The polyp was granular lateral       spreading. Preparations were made for mucosal resection. NBI imaging and       White-light endoscopy was done to demarcate the borders of the lesion.       Orise gel was injected to raise the lesion. Piecemeal mucosal resection       using a snare was performed. Resection and retrieval were complete.       Coagulation for tissue destruction using snare was successful. To       prevent bleeding after mucosal resection, five hemostatic clips were        successfully placed (MR conditional). There was no bleeding at the end       of the procedure.      Seven sessile polyps were found in the transverse colon (5) and cecum       (2). The polyps were 3 to 10 mm in size. These polyps were removed with       a cold snare. Resection and retrieval were complete.      Normal mucosa was found in the entire colon otherwise.      Non-bleeding non-thrombosed external and internal hemorrhoids were found       during retroflexion, during perianal exam and during digital exam. The       hemorrhoids were Grade II (internal hemorrhoids that prolapse but reduce       spontaneously). Impression:               - Hemorrhoids found on digital rectal exam.                           - The examined portion of the ileum was normal.                           - One 25 mm polyp in the cecum, removed with                            mucosal resection. Resected and retrieved. Treated                            with a hot snare. Clips (MR conditional) were                            placed.                           - Seven 3 to 10 mm polyps in the transverse colon                            and in the cecum, removed with a cold snare.  Resected and retrieved.                           - Normal mucosa in the entire examined colon                            otherwise.                           - Non-bleeding non-thrombosed external and internal                            hemorrhoids. Recommendation:           - The patient will be observed post-procedure,                            until all discharge criteria are met.                           - Discharge patient to home.                           - Patient has a contact number available for                            emergencies. The signs and symptoms of potential                            delayed complications were discussed with the                            patient. Return to normal  activities tomorrow.                            Written discharge instructions were provided to the                            patient.                           - High fiber diet.                           - Restart Aspirin in 72 hours (5/22).                           - Minimize NSAIDs for next 2-weeks.                           - Continue present medications otherwise.                           - Await pathology results.                           - Repeat colonoscopy in 6 - 9 months for  surveillance after piecemeal polypectomy.                           - The findings and recommendations were discussed                            with the patient.                           - The findings and recommendations were discussed                            with the patient's family. Procedure Code(s):        --- Professional ---                           [phone], Colonoscopy, flexible; with endoscopic                            mucosal resection                           45385, 38, Colonoscopy, flexible; with removal of                            tumor(s), polyp(s), or other lesion(s) by snare                            technique Diagnosis Code(s):        --- Professional ---                           K64.1, Second degree hemorrhoids                           K63.5, Polyp of colon CPT copyright 2019 American Medical Association. All rights reserved. The codes documented in this report are preliminary and upon coder review may  be revised to meet current compliance requirements. Justice Britain, MD 03/19/2021 10:25:20 AM Number of Addenda: 0

## 2021-03-20 ENCOUNTER — Encounter (HOSPITAL_COMMUNITY): Payer: Self-pay | Admitting: Gastroenterology

## 2021-03-20 ENCOUNTER — Encounter: Payer: Self-pay | Admitting: Gastroenterology

## 2021-03-20 LAB — SURGICAL PATHOLOGY

## 2021-04-27 ENCOUNTER — Telehealth: Payer: Self-pay

## 2021-04-27 DIAGNOSIS — C7A8 Other malignant neuroendocrine tumors: Secondary | ICD-10-CM

## 2021-04-27 NOTE — Telephone Encounter (Signed)
Patient notified of plan and voiced understanding.

## 2021-04-27 NOTE — Telephone Encounter (Signed)
----- Message from Earlie Server, MD sent at 04/25/2021 11:56 PM EDT ----- Dr.Mansouraty,  Thanks for the update.I agree with repeating EUS in 3-6 months.  I will obtain Dotatate PET as well.   Zhou ----- Message ----- From: Irving Copas., MD Sent: 04/16/2021   4:00 AM EDT To: Marlon Pel, RN, Timothy Lasso, RN, #  ZY, I did complete an EGD/EUS and colonoscopy in May.  Extensive evaluation of the stomach did not find evidence of the previous lesion.  My plan was a 3 to 65-month follow-up EUS and then from there to decide what the follow-up would be if we do not see any further evidence of that lesion.  If I do not find a follow-up but would be your recommendation timing and follow-up?  I would normally consider a repeat evaluation in 1 year via EUS and then cross-sectional imaging as per you all (oncology).  Thanks. GM ----- Message ----- From: Earlie Server, MD Sent: 04/15/2021  11:06 PM EDT To: Marlon Pel, RN, Timothy Lasso, RN, #  Dr.Mansouraty,  Greetings! Has he had EUS done for work up of gastric neuroendocrine tumor? Gastrin is normal, CT showed no distant mets.   Earlie Server ----- Message ----- From: Irving Copas., MD Sent: 01/20/2021   3:08 AM EDT To: Marlon Pel, RN, Timothy Lasso, RN, #  Looks like he set up for a clinic visit in the next couple of weeks.  I will see what we can do in regards to the pH strip. Thanks. GM ----- Message ----- From: Virgel Manifold, MD Sent: 01/19/2021  10:07 AM EDT To: Marlon Pel, RN, Timothy Lasso, RN, #  Thank you all for looking into it the referral. Thanks for trying to see if pH strip can be obtained as well. I dont think we have ever used it in [place] either, but ill ask around.   ----- Message ----- From: Irving Copas., MD Sent: 01/17/2021   1:25 AM EDT To: Marlon Pel, RN, Timothy Lasso, RN, #  VT, Thank you for reaching out. Not sure why this is actually the first time I hear about this  patient.  I do see that the referral was placed twice but not sure that it was and acted upon by the [facility] scheduling staff.  I will place my nurse on this as well so that we can figure out where in the referral process this did not get sent to my nurse to then be sent to me for evaluation. In either case, I am happy to be available based on scheduling availability this will be 6 to 10 weeks most likely for a procedure(s). We do not have litmus pH strip at our hospitals any more but I can look into seeing whether that could be obtained.  Lanya Bucks, Please move forward with scheduling the patient a clinic visit to discuss advanced resection attempt of this nonlifting cecal polyp and also EUS/EMR of gastric neuroendocrine tumor. Please move forward with offering him an upper EUS/colonoscopy 2-hour slot at the same time, if he agrees to move forward with the procedures morning he wants to wait until the clinic visit, that is fine as well if that is what he and his wife wanted.    Arye Weyenberg and Barbera Setters can you find out what happened with why this never got to you Chong Sicilian) from our scheduling staff?  Thanks. GM ----- Message ----- From: Virgel Manifold, MD Sent:  01/16/2021  11:46 AM EDT To: Earlie Server, MD, Irving Copas., MD  Hi Dr. Rush Landmark,  I had referred this pt to you for 1. Cecal polyp removal 2. 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   Not sure if you have had a chance to look at the images yet. Also wanted to get your thoughts on doing intragastric pH when you do his EGD?

## 2021-04-27 NOTE — Telephone Encounter (Signed)
-----   Message from Earlie Server, MD sent at 04/25/2021 11:59 PM EDT ----- Please let patient know that I have discussed with his GI doctor. Plan is to repeat EUS in 3-6 months. GI will arrange.  I recommend him to get Dotatate PET for further evaluation. - gastric neuroendocrine cancer.  Please arrange him to see me a few days after image. Thanks.

## 2021-04-30 ENCOUNTER — Telehealth: Payer: Self-pay | Admitting: Oncology

## 2021-04-30 NOTE — Telephone Encounter (Signed)
Please schedule Dotatate PET (first avail) and MD a few days after PET. Please notify pt of appt. Thanks

## 2021-04-30 NOTE — Telephone Encounter (Signed)
Left VM with patient to make him aware of scheduled PET scan and MD f/u. Sending appt reminders in the mail also.

## 2021-04-30 NOTE — Telephone Encounter (Signed)
PET scan and MD f/u scheduled. Left VM with patient and sending reminders via mail.

## 2021-05-08 ENCOUNTER — Ambulatory Visit
Admission: RE | Admit: 2021-05-08 | Discharge: 2021-05-08 | Disposition: A | Discharge status: Home / Self Care | Payer: Medicare Other | Source: Ambulatory Visit | Attending: Oncology | Admitting: Oncology

## 2021-05-08 ENCOUNTER — Other Ambulatory Visit: Payer: Self-pay

## 2021-05-08 DIAGNOSIS — C7A8 Other malignant neuroendocrine tumors: Secondary | ICD-10-CM | POA: Diagnosis not present

## 2021-05-08 MED ORDER — GALLIUM GA 68 DOTATATE IV KIT
4.5600 | PACK | Freq: Once | INTRAVENOUS | Status: AC | PRN
  Administered 2021-05-08: 4.7 via INTRAVENOUS

## 2021-05-12 ENCOUNTER — Encounter: Payer: Self-pay | Admitting: Oncology

## 2021-05-12 ENCOUNTER — Other Ambulatory Visit: Payer: Self-pay

## 2021-05-12 ENCOUNTER — Inpatient Hospital Stay: Payer: Medicare Other | Attending: Oncology | Admitting: Oncology

## 2021-05-12 ENCOUNTER — Telehealth: Payer: Self-pay

## 2021-05-12 VITALS — BP 131/73 | HR 55 | Temp 97.8°F | Resp 18 | Wt 178.0 lb

## 2021-05-12 DIAGNOSIS — Z7189 Other specified counseling: Secondary | ICD-10-CM | POA: Diagnosis not present

## 2021-05-12 DIAGNOSIS — Z8052 Family history of malignant neoplasm of bladder: Secondary | ICD-10-CM | POA: Diagnosis not present

## 2021-05-12 DIAGNOSIS — Z801 Family history of malignant neoplasm of trachea, bronchus and lung: Secondary | ICD-10-CM | POA: Diagnosis not present

## 2021-05-12 DIAGNOSIS — Z86010 Personal history of colonic polyps: Secondary | ICD-10-CM | POA: Diagnosis not present

## 2021-05-12 DIAGNOSIS — E279 Disorder of adrenal gland, unspecified: Secondary | ICD-10-CM | POA: Insufficient documentation

## 2021-05-12 DIAGNOSIS — C7A8 Other malignant neuroendocrine tumors: Secondary | ICD-10-CM

## 2021-05-12 DIAGNOSIS — E278 Other specified disorders of adrenal gland: Secondary | ICD-10-CM

## 2021-05-12 DIAGNOSIS — Z808 Family history of malignant neoplasm of other organs or systems: Secondary | ICD-10-CM | POA: Insufficient documentation

## 2021-05-12 DIAGNOSIS — Z87891 Personal history of nicotine dependence: Secondary | ICD-10-CM | POA: Insufficient documentation

## 2021-05-12 DIAGNOSIS — Z809 Family history of malignant neoplasm, unspecified: Secondary | ICD-10-CM | POA: Diagnosis not present

## 2021-05-12 MED ORDER — ONDANSETRON HCL 4 MG PO TABS: 4.0000 mg | ORAL_TABLET | Freq: Three times a day (TID) | ORAL | 0 refills | Status: DC | PRN

## 2021-05-12 NOTE — Progress Notes (Addendum)
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  HISTORY OF PRESENTING ILLNESS:   Kevin Esparza is a  60 y.o.  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  He underwent EGD and colonoscopy for screening. EGD showed Schatzki's ring which was dilated to 18 mm.   Multiple biopsy was done, submucosal nodule found in the stomach, nodular mucosa in the gastric antrum less curvature, single mucosal papule found in the stomach. Biopsy were obtained and a gastric body at incisura and in the gastric antrum. EGD biopsies showed chronic gastritis.  Lesser curvature stomach biopsy showed well-differentiated neuroendocrine tumor, grade 2. Esophagus mucosa with changes consistent with reflux esophagitis.   Colonoscopy showed 20 mm polyp in the cecum, resection not attempted.  Injected. 12 mm polyp in ascending colon, resected and retrieved. 12 mm polyp in the transverse colon, resected and retrieved. 10 mm polyp in the descending colon, resected and retrieved. 5 small polyps in the sigmoid colon and the descending colon, resected and retrieved.  Nonbleeding internal hemorrhoids. Colonoscopy biopsy showed polyp in the ascending, transverse, descending and sigmoid colon are tubular adenoma.  Negative for high-grade dysplasia and malignancy.  Patient has been  referred to Dr. Rush Landmark for removal of the cecal polyp, and removal of grade 2 well-differentiated neuroendocrine tumor at the lesser curvature of the stomach.  INTERVAL HISTORY Kevin Esparza is a 60 y.o. male who has above history reviewed by me today presents for follow up visit for management of gastric neuroendocrine carcinoma  03/19/2021, colonoscopy by Dr. Rush Landmark, mucosal resection of the 25 mm cecum polyp.  Resected and retrieved.-Pathology showed tubular adenoma. 7 small polyps in the transverse colon and the cecum, resected and retrieved.-Pathology showed tubular adenoma, no high-grade dysplasia or malignancy. 03/19/2021, upper endoscopy ultrasound EGD findings: No gross mucosal lesions in the esophagus.  Z-line regular, 40 cm from the incisors.  Nonobstructing Schatzki ring, 2 cm hiatal hernia, medium amount of food residue in the stomach.  Erythematous mucosa in the stomach.  Nodular mucosa in the gastric body-lesser curvature, biopsied-pathology showed benign gastric mucosa Nodular mucosa in the gastric antrum-lesser curvature biopsied-pathology showed reactive gastropathy Gastric subepithelial lesion at the incisura No gross lesion in the duodenal bulb, first portion of the duodenum and second portion of duodenum  EUS findings, entire stomach showed no wall thickening.  no malignant appearing lymph nodes were visualized in the left gastric region.  Celiac region at perigastric region   After discussed the EUS findings with Dr. Rush Landmark, I obtained dotatate PET scan for further evaluation. Today patient reports feeling queasy for the past few weeks.  Reports history of recent "stomach bug infection" symptoms.  Also has lost 9 pounds.    Review of Systems  Constitutional:  Negative for appetite change, chills, fatigue, fever and unexpected weight change.  HENT:   Negative for hearing loss and voice change.   Eyes:  Negative for eye problems and icterus.  Respiratory:   Negative for chest tightness, cough  and shortness of breath.   Cardiovascular:  Negative for chest pain and leg swelling.  Gastrointestinal:  Positive for diarrhea. Negative for abdominal distention and abdominal pain.       Upset stomach  Endocrine: Negative for hot flashes.  Genitourinary:  Negative for difficulty urinating, dysuria and frequency.   Musculoskeletal:  Negative for arthralgias.  Skin:  Negative for itching and rash.  Neurological:  Negative for light-headedness and numbness.  Hematological:  Negative for adenopathy. Does not bruise/bleed easily.  Psychiatric/Behavioral:  Negative for confusion.    MEDICAL HISTORY:  Past Medical History:  Diagnosis Date   Anxiety    Arthritis    knees, hands   Cancer (HCC)    CHF (congestive heart failure) (HCC)    Diabetes mellitus without complication (HCC)    type 2   ED (erectile dysfunction)    Elevated PSA    GERD (gastroesophageal reflux disease)    Heartburn    History of BPH    HLD (hyperlipidemia)    HTN (hypertension)    Pneumonia    several times    SURGICAL HISTORY: Past Surgical History:  Procedure Laterality Date   APPENDECTOMY  2005   BIOPSY  03/19/2021   Procedure: BIOPSY;  Surgeon: Irving Copas., MD;  Location: [place];  Service: Gastroenterology;;   [place]   c4-c7   CHOLECYSTECTOMY  2005   COLONOSCOPY WITH PROPOFOL N/A 12/31/2020   Procedure: COLONOSCOPY WITH PROPOFOL;  Surgeon: Virgel Manifold, MD;  Location: [facility];  Service: Endoscopy;  Laterality: N/A;   COLONOSCOPY WITH PROPOFOL N/A 03/19/2021   Procedure: COLONOSCOPY WITH PROPOFOL;  Surgeon: Rush Landmark Telford Nab., MD;  Location: [place];  Service: Gastroenterology;  Laterality: N/A;   ENDOSCOPIC MUCOSAL RESECTION  03/19/2021   Procedure: ENDOSCOPIC MUCOSAL RESECTION;  Surgeon: Rush Landmark Telford Nab., MD;  Location: [place];  Service: Gastroenterology;;   ESOPHAGOGASTRODUODENOSCOPY (EGD)  WITH PROPOFOL N/A 12/31/2020   Procedure: ESOPHAGOGASTRODUODENOSCOPY (EGD) WITH PROPOFOL;  Surgeon: Virgel Manifold, MD;  Location: [facility];  Service: Endoscopy;  Laterality: N/A;   ESOPHAGOGASTRODUODENOSCOPY (EGD) WITH PROPOFOL N/A 03/19/2021   Procedure: ESOPHAGOGASTRODUODENOSCOPY (EGD) WITH PROPOFOL;  Surgeon: Rush Landmark Telford Nab., MD;  Location: [place];  Service: Gastroenterology;  Laterality: N/A;   HEMOSTASIS CLIP PLACEMENT  03/19/2021   Procedure: HEMOSTASIS CLIP PLACEMENT;  Surgeon: Irving Copas., MD;  Location: [place];  Service: Gastroenterology;;   MULTIPLE TOOTH EXTRACTIONS     dentures   POLYPECTOMY  03/19/2021   Procedure: POLYPECTOMY;  Surgeon: Irving Copas., MD;  Location: [place];  Service: Gastroenterology;;   SUBMUCOSAL LIFTING INJECTION  03/19/2021   Procedure: SUBMUCOSAL LIFTING INJECTION;  Surgeon: Irving Copas., MD;  Location: [place];  Service: Gastroenterology;;   TONSILLECTOMY     UPPER ESOPHAGEAL ENDOSCOPIC ULTRASOUND (EUS) N/A 03/19/2021   Procedure: UPPER ESOPHAGEAL ENDOSCOPIC ULTRASOUND (EUS);  Surgeon: Irving Copas., MD;  Location: [place];  Service: Gastroenterology;  Laterality: N/A;    SOCIAL HISTORY: Social History   Socioeconomic History   Marital status: Married    Spouse name: Not on file   Number of children: 3   Years of education: Not on file   Highest education level: Not on file  Occupational History   Not on file  Tobacco Use   Smoking status: Former    Years: 14.00    Pack years: 0.00    Types: Cigarettes    Quit date: 03/05/2004    Years since quitting: 34.1  Smokeless tobacco: Current    Types: Chew  Vaping Use   Vaping Use: Never used  Substance and Sexual Activity   Alcohol use: Not Currently    Alcohol/week: 1.0 standard drink    Types: 1 Cans of beer per week    Comment: occaionally   Drug use: Yes    Types: Marijuana    Comment: Last use 03/14/21    Sexual activity: Yes  Other Topics Concern   Not on file  Social History Narrative   Not on file   Social Determinants of Health   Financial Resource Strain: Not on file  Food Insecurity: Not on file  Transportation Needs: Not on file  Physical Activity: Not on file  Stress: Not on file  Social Connections: Not on file  Intimate Partner Violence: Not on file    FAMILY HISTORY: Family History  Problem Relation Age of Onset   Kidney cancer Maternal Grandmother    Liver cancer Maternal Grandmother    Stroke Father    Cancer Father        unknown origin   Hypertension Mother    Diabetes Mother    Osteoarthritis Mother    Osteoporosis Mother    Lung cancer Maternal Grandfather    Kidney disease Neg Hx    Prostate cancer Neg Hx    Bladder Cancer Neg Hx    Colon cancer Neg Hx    Esophageal cancer Neg Hx    Inflammatory bowel disease Neg Hx    Pancreatic cancer Neg Hx    Rectal cancer Neg Hx    Stomach cancer Neg Hx     ALLERGIES:  has No Known Allergies.  MEDICATIONS:  Current Outpatient Medications  Medication Sig Dispense Refill   amLODipine (NORVASC) 10 MG tablet Take 10 mg by mouth daily.     aspirin 81 MG EC tablet Take 1 tablet (81 mg total) by mouth daily. 30 tablet 12   atorvastatin (LIPITOR) 20 MG tablet Take 20 mg by mouth daily.     enalapril (VASOTEC) 2.5 MG tablet Take 5 mg by mouth daily.     gabapentin (NEURONTIN) 300 MG capsule Take 300 mg by mouth 2 (two) times daily.     HYDROcodone-acetaminophen (NORCO/VICODIN) 5-325 MG tablet Take 1 tablet by mouth every 8 (eight) hours as needed for severe pain.     metFORMIN (GLUCOPHAGE) 1000 MG tablet Take 1,000 mg by mouth daily with breakfast.     metoprolol succinate (TOPROL-XL) 50 MG 24 hr tablet Take 50 mg by mouth daily. Take with or immediately following a meal.     omeprazole (PRILOSEC OTC) 20 MG tablet Take 20 mg by mouth daily.     ondansetron (ZOFRAN) 4 MG tablet Take 1 tablet (4 mg total) by mouth  every 8 (eight) hours as needed for nausea or vomiting. 60 tablet 0   cyclobenzaprine (FLEXERIL) 10 MG tablet Take 10 mg by mouth 3 (three) times daily as needed for muscle spasms. (Patient not taking: Reported on 05/12/2021)     PEG-KCl-NaCl-NaSulf-Na Asc-C (PLENVU) 140 g SOLR Take 1 kit by mouth as directed. Use coupon: BIN: [id number] PNC: CNRX Group: [id number] ID: [id number] (Patient not taking: Reported on 05/12/2021) 1 each 0   sildenafil (REVATIO) 20 MG tablet Take 3 to 5 tablets two hours before intercouse on an empty stomach.  Do not take with nitrates. (Patient not taking: No sig reported) 50 tablet 3   No current facility-administered medications for this visit.  PHYSICAL EXAMINATION: ECOG PERFORMANCE STATUS: 0 - Asymptomatic Vitals:   05/12/21 1059  BP: 131/73  Pulse: (!) 55  Resp: 18  Temp: 97.8 F (36.6 C)  SpO2: 100%   Filed Weights   05/12/21 1059  Weight: 178 lb (80.7 kg)    Physical Exam Constitutional:      General: He is not in acute distress. HENT:     Head: Normocephalic and atraumatic.  Eyes:     General: No scleral icterus. Cardiovascular:     Rate and Rhythm: Normal rate and regular rhythm.     Heart sounds: Normal heart sounds.  Pulmonary:     Effort: Pulmonary effort is normal. No respiratory distress.     Breath sounds: No wheezing.     Comments: Decreased breath sound bilaterally. Abdominal:     General: Bowel sounds are normal. There is no distension.     Palpations: Abdomen is soft.  Musculoskeletal:        General: No deformity. Normal range of motion.     Cervical back: Normal range of motion and neck supple.  Skin:    General: Skin is warm and dry.     Findings: No erythema or rash.  Neurological:     Mental Status: He is alert and oriented to person, place, and time. Mental status is at baseline.     Cranial Nerves: No cranial nerve deficit.     Coordination: Coordination normal.  Psychiatric:        Mood and Affect: Mood normal.     LABORATORY DATA:  I have reviewed the data as listed Lab Results  Component Value Date   WBC 6.8 01/30/2021   HGB 13.9 03/19/2021   HCT 41.0 03/19/2021   MCV 86.1 01/30/2021   PLT 159 01/30/2021   Recent Labs    01/27/21 1248 01/30/21 1328 03/19/21 0719  NA  --  140 143  K  --  4.3 3.9  CL  --  105 109  CO2  --  27  --   GLUCOSE  --  110* 116*  BUN  --  10 10  CREATININE 1.00 1.14 1.00  CALCIUM  --  9.3  --   GFRNONAA  --  >60  --   PROT  --  7.3  --   ALBUMIN  --  4.0  --   AST  --  25  --   ALT  --  30  --   ALKPHOS  --  64  --   BILITOT  --  1.0  --    Iron/TIBC/Ferritin/ %Sat No results found for: IRON, TIBC, FERRITIN, IRONPCTSAT    RADIOGRAPHIC STUDIES: I have personally reviewed the radiological images as listed and agreed with the findings in the report. NM PET (NETSPOT GA 59 DOTATATE) SKULL BASE TO MID THIGH  Result Date: 05/09/2021 CLINICAL DATA:  Gastric neuroendocrine cancer. EXAM: NUCLEAR MEDICINE PET SKULL BASE TO THIGH TECHNIQUE: 4.7 mCi Ga 84 DOTATATE was injected intravenously. Full-ring PET imaging was performed from the skull base to thigh after the radiotracer. CT data was obtained and used for attenuation correction and anatomic localization. COMPARISON:  CT scan 01/27/2021 FINDINGS: NECK No radiotracer activity in neck lymph nodes. Incidental CT findings: None CHEST No radiotracer accumulation within mediastinal or hilar lymph nodes. No suspicious pulmonary nodules on the CT scan. Incidental CT finding:Calcified granuloma noted in the left upper lobe anteriorly. ABDOMEN/PELVIS Abnormal uptake is identified in the stomach most notably along the greater curvature in  the fundal and body region. SUV max is 14.36. This corresponds with asymmetric wall thickening on the CT scan. Small celiac axis lymph nodes are noted. The 10 mm node on image 161/3 is mildly hypermetabolic with SUV max of 1.30. The second node measures 8 mm on image number 157/3 and has an  SUV max of 4.18. No retroperitoneal or pelvic lymphadenopathy. Physiologic activity noted in the liver, spleen, adrenal glands and kidneys. Incidental CT findings:Age advanced vascular calcifications but no aneurysm. Surgical changes from an appendectomy. Status post cholecystectomy. No biliary dilatation. Stable mild splenomegaly. SKELETON No focal activity to suggest skeletal metastasis. Incidental CT findings:None IMPRESSION: 1. Hypermetabolism noted in the greater curvature of the stomach along the fundal and upper body region consistent with gastric neuroendocrine neoplasm. 2. Small weakly positive celiac axis lymph nodes suggests metastatic adenopathy. 3. No other significant findings. Electronically Signed   By: Marijo Sanes M.D.   On: 05/09/2021 16:36      ASSESSMENT & PLAN:  1. Neuroendocrine carcinoma ([place])   2. Goals of care, counseling/discussion    Gastric well-differentiated neuroendocrine carcinoma. Need to determined types.  Gastrin level was normal at 22.-This is done 2 weeks after starting PPI. Vitamin B12 level is normal at 312. Probably type III 01/27/2021, CT abdomen with contrast showed no gastric mucosal or submucosal lesion.  No upper abdominal lymphadenopathy.  Enlarging nodule left adrenal slowly increased in size.  03/19/2021 EUS did not reveal the similar findings from 12/31/2020 endoscopy.  EUS biopsy was also negative for neuroendocrine carcinoma. After discussing with both gastroenterologist, I decided to obtain dotatate PET scan for further evaluation. 05/09/2019, dotatate PET scan showed hypermetabolic activity noted in the greater curvature of the stomach along the fundal and upper body region consistent with gastric neuroendocrine neoplasm.  Small low-grade positive celiac axis lymph nodes suggesting metabolic adenopathy.  No other significant findings.  Had a lengthy discussion with patient.  Given that there is possibility of regional lymph node evaluation.  I think  patient may benefit from gastrectomy.  Will refer patient to establish care with [facility] medical oncology and surgical oncology Dr.Kim  Patient agrees with the plan.  Follow-up appointment to be determined. I discussed with GI nurse navigator Mariea Clonts who will arrange the referral.   # growing adrenal lesion. Check MRI abdomen for further evaluation.   All questions were answered. The patient knows to call the clinic with any problems questions or concerns.  cc Ranae Plumber, [place]   Thank you for this kind referral and the opportunity to participate in the care of this patient. A copy of today's note is routed to referring provider celiac lymphadenopathy   Earlie Server, MD, PhD Hematology Oncology [facility] Pager- [phone] 05/12/2021

## 2021-05-12 NOTE — Telephone Encounter (Signed)
Referral sent to Dr. Maudie Mercury and Dr. Altamease Oiler at [facility]

## 2021-05-12 NOTE — Progress Notes (Signed)
Has been "queasy" for the past few weeks. Had a stomach bug type thing for 3 days about 3 weeks ago. States that his grandson had it also. Has lost some weight. He also states that his "sugar has been acting funny"

## 2021-05-13 ENCOUNTER — Telehealth: Payer: Self-pay

## 2021-05-13 DIAGNOSIS — E278 Other specified disorders of adrenal gland: Secondary | ICD-10-CM | POA: Insufficient documentation

## 2021-05-13 NOTE — Telephone Encounter (Signed)
Per [place] from Dr. Tasia Catchings: please let him know that he also has some growth of a lesion on his adrenal. I recommend MRI abdomen to further evaluate it  - MRI abdomen w contrast. put adrenal lesion as reason/comment  Pt has been notified.

## 2021-05-14 ENCOUNTER — Other Ambulatory Visit: Payer: Self-pay

## 2021-05-14 DIAGNOSIS — E278 Other specified disorders of adrenal gland: Secondary | ICD-10-CM

## 2021-05-14 NOTE — Telephone Encounter (Signed)
Please schedule patient for MRI and notify pt of appt.

## 2021-05-20 ENCOUNTER — Other Ambulatory Visit: Payer: Self-pay

## 2021-05-20 ENCOUNTER — Ambulatory Visit (INDEPENDENT_AMBULATORY_CARE_PROVIDER_SITE_OTHER): Payer: Medicare Other | Admitting: Gastroenterology

## 2021-05-20 VITALS — BP 126/72 | HR 59 | Temp 97.9°F | Ht 70.0 in | Wt 180.2 lb

## 2021-05-20 DIAGNOSIS — R110 Nausea: Secondary | ICD-10-CM | POA: Diagnosis not present

## 2021-05-20 DIAGNOSIS — D3A8 Other benign neuroendocrine tumors: Secondary | ICD-10-CM | POA: Diagnosis not present

## 2021-05-20 NOTE — Progress Notes (Signed)
[name], MD [address]  [place], [place] [address]  Main: [phone]  Fax: [phone]   Primary Care Physician: Ranae Plumber, [place]   Chief complaint: Stomach tumor  HPI: Kevin Esparza is a 60 y.o. 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  Since last visit patient underwent PET scan which showed hypermetabolic activity in the stomach, with small low-grade positive celiac axis lymph nodes.  Based on discussion with Dr. Tasia Catchings (her note from July 2022 was reviewed), patient has been referred to [facility] GI medical oncology and surgical surgical oncology.  Patient says he has an appointment coming up already.  Patient reports good appetite.  No weight loss.  Does report some nausea but no vomiting  Dr. Rush Landmark recommended a repeat colonoscopy in 6 to 9 months after removing the large cecal polyp  He had originally recommended EUS after his last EGD that did not find a neuroendocrine tumor that was originally seen on the index upper endoscopy, but these plans are likely to change given pending oncology referrals at [facility]  ROS: All ROS reviewed and negative except as per HPI   Past Medical History:  Diagnosis Date   Anxiety    Arthritis    knees, hands   Cancer ([place])    CHF (congestive heart failure) ([place])    Diabetes mellitus without complication ([place])    type 2   ED (erectile dysfunction)    Elevated PSA    GERD (gastroesophageal reflux disease)    Heartburn    History of BPH    HLD (hyperlipidemia)    HTN (hypertension)    Neuroendocrine carcinoma of stomach ([place]) 02/07/2021   Pneumonia    several times    Past Surgical History:  Procedure Laterality Date   APPENDECTOMY  2005   BIOPSY  03/19/2021   Procedure: BIOPSY;  Surgeon: Irving Copas., MD;  Location: [place];  Service: Gastroenterology;;   [place]   c4-c7   CHOLECYSTECTOMY  2005    COLONOSCOPY WITH PROPOFOL N/A 12/31/2020   Procedure: COLONOSCOPY WITH PROPOFOL;  Surgeon: Virgel Manifold, MD;  Location: [facility] ENDOSCOPY;  Service: Endoscopy;  Laterality: N/A;   COLONOSCOPY WITH PROPOFOL N/A 03/19/2021   Procedure: COLONOSCOPY WITH PROPOFOL;  Surgeon: Rush Landmark Telford Nab., MD;  Location: [place];  Service: Gastroenterology;  Laterality: N/A;   ENDOSCOPIC MUCOSAL RESECTION  03/19/2021   Procedure: ENDOSCOPIC MUCOSAL RESECTION;  Surgeon: Rush Landmark Telford Nab., MD;  Location: [place];  Service: Gastroenterology;;   ESOPHAGOGASTRODUODENOSCOPY (EGD) WITH PROPOFOL N/A 12/31/2020   Procedure: ESOPHAGOGASTRODUODENOSCOPY (EGD) WITH PROPOFOL;  Surgeon: Virgel Manifold, MD;  Location: [facility] ENDOSCOPY;  Service: Endoscopy;  Laterality: N/A;   ESOPHAGOGASTRODUODENOSCOPY (EGD) WITH PROPOFOL N/A 03/19/2021   Procedure: ESOPHAGOGASTRODUODENOSCOPY (EGD) WITH PROPOFOL;  Surgeon: Rush Landmark Telford Nab., MD;  Location: [place];  Service: Gastroenterology;  Laterality: N/A;   HEMOSTASIS CLIP PLACEMENT  03/19/2021   Procedure: HEMOSTASIS CLIP PLACEMENT;  Surgeon: Irving Copas., MD;  Location: [place];  Service: Gastroenterology;;   MULTIPLE TOOTH EXTRACTIONS     dentures   POLYPECTOMY  03/19/2021   Procedure: POLYPECTOMY;  Surgeon: Irving Copas., MD;  Location: [place];  Service: Gastroenterology;;   SUBMUCOSAL LIFTING INJECTION  03/19/2021   Procedure: SUBMUCOSAL LIFTING INJECTION;  Surgeon: Irving Copas., MD;  Location: [place];  Service: Gastroenterology;;   TONSILLECTOMY     UPPER ESOPHAGEAL ENDOSCOPIC ULTRASOUND (EUS) N/A 03/19/2021  Procedure: UPPER ESOPHAGEAL ENDOSCOPIC ULTRASOUND (EUS);  Surgeon: Irving Copas., MD;  Location: [place];  Service: Gastroenterology;  Laterality: N/A;    Prior to Admission medications   Medication Sig Start Date End Date Taking? Authorizing Provider  amLODipine (NORVASC) 10 MG  tablet Take 10 mg by mouth daily.   Yes [provider]  aspirin 81 MG EC tablet Take 1 tablet (81 mg total) by mouth daily. 03/22/21  Yes Mansouraty, Telford Nab., MD  atorvastatin (LIPITOR) 20 MG tablet Take 20 mg by mouth daily.   Yes [provider]  cyclobenzaprine (FLEXERIL) 10 MG tablet Take 10 mg by mouth 3 (three) times daily as needed for muscle spasms.   Yes [provider]  enalapril (VASOTEC) 2.5 MG tablet Take 5 mg by mouth daily.   Yes [provider]  gabapentin (NEURONTIN) 300 MG capsule Take 300 mg by mouth 2 (two) times daily.   Yes [provider]  HYDROcodone-acetaminophen (NORCO/VICODIN) 5-325 MG tablet Take 1 tablet by mouth every 8 (eight) hours as needed for severe pain. 02/16/21  Yes [provider]  metFORMIN (GLUCOPHAGE) 1000 MG tablet Take 1,000 mg by mouth daily with breakfast.   Yes [provider]  metoprolol succinate (TOPROL-XL) 50 MG 24 hr tablet Take 50 mg by mouth daily. Take with or immediately following a meal.   Yes [provider]  omeprazole (PRILOSEC OTC) 20 MG tablet Take 20 mg by mouth daily.   Yes [provider]  ondansetron (ZOFRAN) 4 MG tablet Take 1 tablet (4 mg total) by mouth every 8 (eight) hours as needed for nausea or vomiting. 05/12/21  Yes Earlie Server, MD  PEG-KCl-NaCl-NaSulf-Na Asc-C (PLENVU) 140 g SOLR Take 1 kit by mouth as directed. Use coupon: BIN: [id number] [facility]: CNRX Group: [id number] ID: [id number] 02/04/21  Yes Mansouraty, Telford Nab., MD  sildenafil (REVATIO) 20 MG tablet Take 3 to 5 tablets two hours before intercouse on an empty stomach.  Do not take with nitrates. Patient taking differently: Take 3 to 5 tablets two hours before intercouse on an empty stomach.  Do not take with nitrates. 09/04/18  Yes McGowan, Hunt Oris, PA-C    Family History  Problem Relation Age of Onset   Kidney cancer Maternal Grandmother    Liver cancer Maternal Grandmother    Stroke  Father    Cancer Father        unknown origin   Hypertension Mother    Diabetes Mother    Osteoarthritis Mother    Osteoporosis Mother    Lung cancer Maternal Grandfather    Kidney disease Neg Hx    Prostate cancer Neg Hx    Bladder Cancer Neg Hx    Colon cancer Neg Hx    Esophageal cancer Neg Hx    Inflammatory bowel disease Neg Hx    Pancreatic cancer Neg Hx    Rectal cancer Neg Hx    Stomach cancer Neg Hx      Social History   Tobacco Use   Smoking status: Former    Years: 14.00    Types: Cigarettes    Quit date: 03/05/2004    Years since quitting: 17.2   Smokeless tobacco: Current    Types: Chew  Vaping Use   Vaping Use: Never used  Substance Use Topics   Alcohol use: Not Currently    Alcohol/week: 1.0 standard drink    Types: 1 Cans of beer per week    Comment: occaionally   Drug use:  Yes    Types: Marijuana    Comment: Last use 03/14/21    Allergies as of 05/20/2021   (No Known Allergies)    Physical Examination:  Constitutional: General:   Alert,  Well-developed, well-nourished, pleasant and cooperative in NAD BP 126/72   Pulse (!) 59   Temp 97.9 F (36.6 C) (Oral)   Ht _0  (1.778 m)   Wt 180 lb 3.2 oz (81.7 kg)   BMI 25.86 kg/m   Respiratory: Normal respiratory effort  Gastrointestinal:  Soft, non-tender and non-distended without masses, hepatosplenomegaly or hernias noted.  No guarding or rebound tenderness.     Cardiac: No clubbing or edema.  No cyanosis. Normal posterior tibial pedal pulses noted.  Psych:  Alert and cooperative. Normal mood and affect.  Musculoskeletal:  Normal gait. Head normocephalic, atraumatic. Symmetrical without gross deformities. 5/5 Lower extremity strength bilaterally.  Skin: Warm. Intact without significant lesions or rashes. No jaundice.  Neck: Supple, trachea midline  Lymph: No cervical lymphadenopathy  Psych:  Alert and oriented x3, Alert and cooperative. Normal mood and affect.  Labs: CMP      Component Value Date/Time   NA 143 03/19/2021 0719   NA 134 (L) 04/20/2014 1206   K 3.9 03/19/2021 0719   K 4.0 04/20/2014 1206   CL 109 03/19/2021 0719   CL 100 04/20/2014 1206   CO2 27 01/30/2021 1328   CO2 30 04/20/2014 1206   GLUCOSE 116 (H) 03/19/2021 0719   GLUCOSE 121 (H) 04/20/2014 1206   BUN 10 03/19/2021 0719   BUN 12 04/20/2014 1206   CREATININE 1.00 03/19/2021 0719   CREATININE 0.98 04/20/2014 1206   CALCIUM 9.3 01/30/2021 1328   CALCIUM 8.9 04/20/2014 1206   PROT 7.3 01/30/2021 1328   ALBUMIN 4.0 01/30/2021 1328   AST 25 01/30/2021 1328   ALT 30 01/30/2021 1328   ALKPHOS 64 01/30/2021 1328   BILITOT 1.0 01/30/2021 1328   GFRNONAA >60 01/30/2021 1328   GFRNONAA >60 04/20/2014 1206   GFRAA >60 03/14/2018 1148   GFRAA >60 04/20/2014 1206   Lab Results  Component Value Date   WBC 6.8 01/30/2021   HGB 13.9 03/19/2021   HCT 41.0 03/19/2021   MCV 86.1 01/30/2021   PLT 159 01/30/2021    Imaging Studies:   Assessment and Plan:   Kevin Esparza is a 61 y.o. y/o male here for follow-up of gastric neuroendocrine tumor  Further plan of care in regard to the tumor will need to be determined after referral to [facility] oncology and surgical oncology  Patient's intermittent nausea is likely related to this tumor.  He otherwise does not have any obstructive symptoms at this time.  He states the symptoms are fleeting and are not affecting his appetite.  No intervention or medication needed at this point.  However, if symptoms worsen patient advised to call us back.  Dr. Tasia Catchings has already prescribed him Zofran as well  Continue follow-up for colonoscopy surveillance as previously planned with Dr. Rush Landmark  Follow-up in clinic made for the patient to ensure assistance with coordination of care, and patient encouraged to call us before that if needed as well    Dr [name]

## 2021-05-25 ENCOUNTER — Ambulatory Visit: Payer: Medicare Other

## 2021-05-27 ENCOUNTER — Ambulatory Visit
Admission: RE | Admit: 2021-05-27 | Discharge: 2021-05-27 | Disposition: A | Discharge status: Home / Self Care | Payer: Medicare Other | Source: Ambulatory Visit | Attending: Oncology | Admitting: Oncology

## 2021-05-27 DIAGNOSIS — E278 Other specified disorders of adrenal gland: Secondary | ICD-10-CM | POA: Insufficient documentation

## 2021-05-27 MED ORDER — GADOBUTROL 1 MMOL/ML IV SOLN
8.0000 mL | Freq: Once | INTRAVENOUS | Status: AC | PRN
  Administered 2021-05-27: 8 mL via INTRAVENOUS

## 2021-06-01 ENCOUNTER — Telehealth: Payer: Self-pay

## 2021-06-01 NOTE — Telephone Encounter (Signed)
-----   Message from Earlie Server, MD sent at 06/01/2021  8:46 AM EDT ----- Adrenal gland nodule is probably benign.  He needs to repeat EGD, looks like he already has appt with GI in Oct.  Please schedule him to see me a few weeks after his repeat EGD thanks.

## 2021-06-01 NOTE — Telephone Encounter (Signed)
Will add pt to reminder list to follow up on when he will be scheduled for EGD and set up follow up appt date then.

## 2021-08-20 ENCOUNTER — Ambulatory Visit (INDEPENDENT_AMBULATORY_CARE_PROVIDER_SITE_OTHER): Payer: Medicare Other | Admitting: Gastroenterology

## 2021-08-20 ENCOUNTER — Other Ambulatory Visit: Payer: Self-pay

## 2021-08-20 VITALS — BP 153/79 | HR 65 | Temp 98.4°F | Wt 183.0 lb

## 2021-08-20 DIAGNOSIS — D3A8 Other benign neuroendocrine tumors: Secondary | ICD-10-CM | POA: Diagnosis not present

## 2021-08-20 DIAGNOSIS — K219 Gastro-esophageal reflux disease without esophagitis: Secondary | ICD-10-CM | POA: Diagnosis not present

## 2021-08-21 ENCOUNTER — Telehealth: Payer: Self-pay

## 2021-08-21 MED ORDER — OMEPRAZOLE MAGNESIUM 20 MG PO TBEC: 40.0000 mg | DELAYED_RELEASE_TABLET | Freq: Two times a day (BID) | ORAL | 0 refills | Status: DC

## 2021-08-21 NOTE — Telephone Encounter (Signed)
I have added an EGD to the recall.

## 2021-08-21 NOTE — Progress Notes (Signed)
Melodie Bouillon, MD [address]  [address]  [place], [place] [address]  Main: [phone]  Fax: [phone]   Primary Care Physician: Shane Crutch, [place]   Chief Complaint  Patient presents with   Follow-up    3 month follow up...    Gastroesophageal Reflux    Pt reports increase in GERD Sx regurgitation x 2 weeks.... has increased Prilosc to BID with minimal relief... pt eats up until bedtime     HPI: Kevin Esparza is a 60 y.o. male here for follow-up and is reporting breakthrough symptoms despite taking PPI.  Has started taking 2 doses of omeprazole in the morning for a total of 40 mg in the morning, and an additional 20 mg at night due to worsening of reflux symptoms, especially at night.  No dysphagia, no nausea or vomiting.  No weight loss.  Has previously seen oncology here, and hematology oncology and surgical oncology at [facility].  See their detailed notes in care everywhere, that I have reviewed as well.  On their most recent note on 05/25/2021, by Dr. Selena Batten from surgical oncology, they recommended a repeat endoscopy, to be scheduled locally and repeat follow-up with them after that.  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  Since last visit patient underwent PET scan which showed hypermetabolic activity in the stomach, with small low-grade positive celiac axis lymph nodes.  Based on discussion with Dr. Cathie Hoops (her note from July 2022 was reviewed), patient has been referred to [facility] GI medical oncology and surgical surgical oncology.  Patient says he has an appointment coming up already.   Patient reports good appetite.  No weight loss.  Does report some nausea but no vomiting   Dr. Meridee Score recommended a repeat colonoscopy in 6 to 9 months after removing the large cecal polyp   He had originally recommended EUS after his last EGD that did not find a neuroendocrine tumor that was originally seen on the  index upper endoscopy.  See EUS report from Mar 19, 2021  ROS: All ROS reviewed and negative except as per HPI   Past Medical History:  Diagnosis Date   Anxiety    Arthritis    knees, hands   Cancer ([facility])    CHF (congestive heart failure) ([facility])    Diabetes mellitus without complication ([facility])    type 2   ED (erectile dysfunction)    Elevated PSA    GERD (gastroesophageal reflux disease)    Heartburn    History of BPH    HLD (hyperlipidemia)    HTN (hypertension)    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   Pneumonia    several times    Past Surgical History:  Procedure Laterality Date   APPENDECTOMY  2005   BIOPSY  03/19/2021   Procedure: BIOPSY;  Surgeon: Lemar Lofty., MD;  Location: [facility] ENDOSCOPY;  Service: Gastroenterology;;   CERVICAL SPINE SURGERY  1997   c4-c7   CHOLECYSTECTOMY  2005   COLONOSCOPY WITH PROPOFOL N/A 12/31/2020   Procedure: COLONOSCOPY WITH PROPOFOL;  Surgeon: Pasty Spillers, MD;  Location: [facility] ENDOSCOPY;  Service: Endoscopy;  Laterality: N/A;   COLONOSCOPY WITH PROPOFOL N/A 03/19/2021   Procedure: COLONOSCOPY WITH PROPOFOL;  Surgeon: Meridee Score Netty Starring., MD;  Location: [facility] ENDOSCOPY;  Service: Gastroenterology;  Laterality: N/A;   ENDOSCOPIC MUCOSAL RESECTION  03/19/2021   Procedure: ENDOSCOPIC MUCOSAL RESECTION;  Surgeon: Meridee Score, Netty Starring., MD;  Location: [facility] ENDOSCOPY;  Service:  Gastroenterology;;   ESOPHAGOGASTRODUODENOSCOPY (EGD) WITH PROPOFOL N/A 12/31/2020   Procedure: ESOPHAGOGASTRODUODENOSCOPY (EGD) WITH PROPOFOL;  Surgeon: Pasty Spillers, MD;  Location: [facility] ENDOSCOPY;  Service: Endoscopy;  Laterality: N/A;   ESOPHAGOGASTRODUODENOSCOPY (EGD) WITH PROPOFOL N/A 03/19/2021   Procedure: ESOPHAGOGASTRODUODENOSCOPY (EGD) WITH PROPOFOL;  Surgeon: Meridee Score Netty Starring., MD;  Location: [facility] ENDOSCOPY;  Service: Gastroenterology;  Laterality: N/A;   HEMOSTASIS CLIP PLACEMENT  03/19/2021   Procedure: HEMOSTASIS CLIP  PLACEMENT;  Surgeon: Lemar Lofty., MD;  Location: [facility] ENDOSCOPY;  Service: Gastroenterology;;   MULTIPLE TOOTH EXTRACTIONS     dentures   POLYPECTOMY  03/19/2021   Procedure: POLYPECTOMY;  Surgeon: Lemar Lofty., MD;  Location: [facility] ENDOSCOPY;  Service: Gastroenterology;;   SUBMUCOSAL LIFTING INJECTION  03/19/2021   Procedure: SUBMUCOSAL LIFTING INJECTION;  Surgeon: Lemar Lofty., MD;  Location: [facility] ENDOSCOPY;  Service: Gastroenterology;;   TONSILLECTOMY     UPPER ESOPHAGEAL ENDOSCOPIC ULTRASOUND (EUS) N/A 03/19/2021   Procedure: UPPER ESOPHAGEAL ENDOSCOPIC ULTRASOUND (EUS);  Surgeon: Lemar Lofty., MD;  Location: [facility] ENDOSCOPY;  Service: Gastroenterology;  Laterality: N/A;    Prior to Admission medications   Medication Sig Start Date End Date Taking? Authorizing Provider  amLODipine (NORVASC) 10 MG tablet Take 10 mg by mouth daily.   Yes [provider]  aspirin 81 MG EC tablet Take 1 tablet (81 mg total) by mouth daily. 03/22/21  Yes Mansouraty, Netty Starring., MD  atorvastatin (LIPITOR) 20 MG tablet Take 20 mg by mouth daily.   Yes [provider]  cyclobenzaprine (FLEXERIL) 10 MG tablet Take 10 mg by mouth 3 (three) times daily as needed for muscle spasms.   Yes [provider]  enalapril (VASOTEC) 2.5 MG tablet Take 5 mg by mouth daily.   Yes [provider]  gabapentin (NEURONTIN) 300 MG capsule Take 300 mg by mouth 2 (two) times daily.   Yes [provider]  HYDROcodone-acetaminophen (NORCO/VICODIN) 5-325 MG tablet Take 1 tablet by mouth every 8 (eight) hours as needed for severe pain. 02/16/21  Yes [provider]  metFORMIN (GLUCOPHAGE) 1000 MG tablet Take 1,000 mg by mouth daily with breakfast.   Yes [provider]  metoprolol succinate (TOPROL-XL) 50 MG 24 hr tablet Take 50 mg by mouth daily. Take with or immediately following a meal.   Yes [provider]  omeprazole (PRILOSEC OTC)  20 MG tablet Take 20 mg by mouth daily.   Yes [provider]  ondansetron (ZOFRAN) 4 MG tablet Take 1 tablet (4 mg total) by mouth every 8 (eight) hours as needed for nausea or vomiting. 05/12/21  Yes Rickard Patience, MD  PEG-KCl-NaCl-NaSulf-Na Asc-C (PLENVU) 140 g SOLR Take 1 kit by mouth as directed. Use coupon: BIN: [id number] [facility]: CNRX Group: [id number] ID: [id number] 02/04/21  Yes Mansouraty, Netty Starring., MD  sildenafil (REVATIO) 20 MG tablet Take 3 to 5 tablets two hours before intercouse on an empty stomach.  Do not take with nitrates. Patient taking differently: Take 3 to 5 tablets two hours before intercouse on an empty stomach.  Do not take with nitrates. 09/04/18  Yes McGowan, [place], PA-C    Family History  Problem Relation Age of Onset   Kidney cancer Maternal Grandmother    Liver cancer Maternal Grandmother    Stroke Father    Cancer Father        unknown origin   Hypertension Mother    Diabetes Mother    Osteoarthritis Mother    Osteoporosis Mother  Lung cancer Maternal Grandfather    Kidney disease Neg Hx    Prostate cancer Neg Hx    Bladder Cancer Neg Hx    Colon cancer Neg Hx    Esophageal cancer Neg Hx    Inflammatory bowel disease Neg Hx    Pancreatic cancer Neg Hx    Rectal cancer Neg Hx    Stomach cancer Neg Hx      Social History   Tobacco Use   Smoking status: Former    Years: 14.00    Types: Cigarettes    Quit date: 03/05/2004    Years since quitting: 17.4   Smokeless tobacco: Current    Types: Chew  Vaping Use   Vaping Use: Never used  Substance Use Topics   Alcohol use: Not Currently    Alcohol/week: 1.0 standard drink    Types: 1 Cans of beer per week    Comment: occaionally   Drug use: Yes    Types: Marijuana    Comment: Last use 03/14/21    Allergies as of 08/20/2021   (No Known Allergies)    Physical Examination:  Constitutional: General:   Alert,  Well-developed, well-nourished, pleasant and cooperative in NAD BP (!) 153/79    Pulse 65   Temp 98.4 F (36.9 C) (Oral)   Wt 183 lb (83 kg)   BMI 26.26 kg/m   Respiratory: Normal respiratory effort  Gastrointestinal:  Soft, non-tender and non-distended without masses, hepatosplenomegaly or hernias noted.  No guarding or rebound tenderness.     Cardiac: No clubbing or edema.  No cyanosis. Normal posterior tibial pedal pulses noted.  Psych:  Alert and cooperative. Normal mood and affect.  Musculoskeletal:  Normal gait. Head normocephalic, atraumatic. Symmetrical without gross deformities. 5/5 Lower extremity strength bilaterally.  Skin: Warm. Intact without significant lesions or rashes. No jaundice.  Neck: Supple, trachea midline  Lymph: No cervical lymphadenopathy  Psych:  Alert and oriented x3, Alert and cooperative. Normal mood and affect.  Labs: CMP     Component Value Date/Time   NA 143 03/19/2021 0719   NA 134 (L) 04/20/2014 1206   K 3.9 03/19/2021 0719   K 4.0 04/20/2014 1206   CL 109 03/19/2021 0719   CL 100 04/20/2014 1206   CO2 27 01/30/2021 1328   CO2 30 04/20/2014 1206   GLUCOSE 116 (H) 03/19/2021 0719   GLUCOSE 121 (H) 04/20/2014 1206   BUN 10 03/19/2021 0719   BUN 12 04/20/2014 1206   CREATININE 1.00 03/19/2021 0719   CREATININE 0.98 04/20/2014 1206   CALCIUM 9.3 01/30/2021 1328   CALCIUM 8.9 04/20/2014 1206   PROT 7.3 01/30/2021 1328   ALBUMIN 4.0 01/30/2021 1328   AST 25 01/30/2021 1328   ALT 30 01/30/2021 1328   ALKPHOS 64 01/30/2021 1328   BILITOT 1.0 01/30/2021 1328   GFRNONAA >60 01/30/2021 1328   GFRNONAA >60 04/20/2014 1206   GFRAA >60 03/14/2018 1148   GFRAA >60 04/20/2014 1206   Lab Results  Component Value Date   WBC 6.8 01/30/2021   HGB 13.9 03/19/2021   HCT 41.0 03/19/2021   MCV 86.1 01/30/2021   PLT 159 01/30/2021    Imaging Studies:   Assessment and Plan:   Kevin Esparza is a 60 y.o. y/o male with history of gastric neuroendocrine tumor, here for follow-up  Patient has been seen by surgical  oncology and hematology oncology at [facility] and they have recommended repeat upper endoscopy.  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  The colonoscopy would be done by Dr. Rush Landmark.  Patient would like to have his EGD and colonoscopy done on 1 day, preferably January 1 or after.  I have reached out to Dr. Rush Landmark to see if he would be okay with doing the patient's EGD for history of gastric neuroendocrine tumor, and if so both EGD and colonoscopy can be scheduled with them.   Due to his breakthrough reflux symptoms, increase PPI to 40 mg twice daily for 4 weeks only  Acid reflux lifestyle measures discussed  (Risks of PPI use were discussed with patient including bone loss, C. Diff diarrhea, pneumonia, infections, CKD, electrolyte abnormalities.  Pt. Verbalizes understanding and chooses to continue the medication.)    Dr [name]

## 2021-08-21 NOTE — Telephone Encounter (Signed)
-----   Message from Irving Copas., MD sent at 08/21/2021 11:59 AM EDT ----- VT, No problem. Vipul Cafarelli, when this patient's colonoscopy with EMR follow-up comes up for early next year please make sure that we have an additional EGD for follow-up of gastric neuroendocrine tumor. Scheduled together. Thanks. GM ----- Message ----- From: Virgel Manifold, MD Sent: 08/21/2021  11:15 AM EDT To: Irving Copas., MD  Hi Dr. Jerilynn Mages,  This pt is about due for his repeat colonoscopy for the large polyp you removed. Hem/onc that saw him at [facility] also recommends a repeat Upper endoscopy to see if we see any other evidence of the Gastric NET previously noted. I spoke to the pt to see if he would like both EGD and Colon done on one day with you and he is comfortable with that. If you are ok with doing his EGD in addition to his colonoscopy, would you mind scheduling it? He wants to have it done Jan 1st or after.

## 2021-08-25 ENCOUNTER — Telehealth: Payer: Self-pay

## 2021-08-25 NOTE — Telephone Encounter (Signed)
Error

## 2021-09-03 ENCOUNTER — Telehealth: Payer: Self-pay

## 2021-09-03 NOTE — Telephone Encounter (Signed)
-----   Message from Lurlean Nanny, [place] sent at 08/25/2021  5:53 PM EDT ----- Regarding: hh  ----- Message ----- From: Virgel Manifold, MD Sent: 08/24/2021   9:32 AM EDT To: Lurlean Nanny, CMA  Kevin Esparza, please see below. Please let the patient know Dr. Rush Landmark will be doing his EGD and colonoscopy as per below. Please ensure pt has their phone number as well and he can call them in January as well, if he has not heard from them by then, since he wanted his procedures done after Jan 1st. Please set up clinic recall for 6 months as well. Thank you  ----- Message ----- From: Kevin Copas., MD Sent: 08/21/2021  12:00 PM EDT To: Kevin Lasso, RN, Virgel Manifold, MD  VT, No problem. Patty, when this patient's colonoscopy with EMR follow-up comes up for early next year please make sure that we have an additional EGD for follow-up of gastric neuroendocrine tumor. Scheduled together. Thanks. GM ----- Message ----- From: Virgel Manifold, MD Sent: 08/21/2021  11:15 AM EDT To: Kevin Copas., MD  Hi Dr. Jerilynn Esparza,  This pt is about due for his repeat colonoscopy for the large polyp you removed. Hem/onc that saw him at [facility] also recommends a repeat Upper endoscopy to see if we see any other evidence of the Gastric NET previously noted. I spoke to the pt to see if he would like both EGD and Colon done on one day with you and he is comfortable with that. If you are ok with doing his EGD in addition to his colonoscopy, would you mind scheduling it? He wants to have it done Jan 1st or after.

## 2021-09-03 NOTE — Telephone Encounter (Signed)
Left message on voicemail  I gave pt the number for [facility] and advised he would have procedure with their office and f/u with [facility] in 6 months

## 2021-10-19 ENCOUNTER — Telehealth: Payer: Self-pay

## 2021-10-19 NOTE — Telephone Encounter (Signed)
Spoke to patient and he states that he will call and get scheduled to have EGD/ colonoscopy in Jan. Offered him to schedule and appt in Jan to follow up with Dr. Tasia Catchings, but he would like to wait until after procedure. He will call back to let us know when it will be scheduled and then we will schedule him to follow up with Dr. Tasia Catchings a few weeks after.

## 2021-10-19 NOTE — Telephone Encounter (Signed)
-----   Message from Earlie Server, MD sent at 10/18/2021 11:09 AM EST ----- Regarding: RE: OFFICE [place] Please check if he is on schedule for EGD and colonoscopy.  I would like to see him for follow up a few weeks after endoscopy. If no plan of endoscopy, please schedule him to see me in January, non urgent. MD only  ----- Message ----- From: Evelina Dun, RN Sent: 05/26/2021  11:19 AM EST To: Earlie Server, MD Subject: FW: OFFICE NOTES-[facility]          ----- Message ----- From: Secundino Ginger Sent: 05/26/2021  10:58 AM EDT To: Lorrin Jackson, CMA, Evelina Dun, RN Subject: OFFICE [place] ONCOLOGY             OFFICE [place] sent to chart.

## 2021-10-20 ENCOUNTER — Other Ambulatory Visit: Payer: Self-pay

## 2021-10-20 DIAGNOSIS — D3A8 Other benign neuroendocrine tumors: Secondary | ICD-10-CM

## 2021-10-20 DIAGNOSIS — K635 Polyp of colon: Secondary | ICD-10-CM

## 2021-10-20 NOTE — Telephone Encounter (Signed)
Inbound call from patient calling in to schedule Endo/Colon at hospital

## 2021-10-20 NOTE — Telephone Encounter (Signed)
Colon EMR, EGD has been scheduled for 12/28/21 at 11 am at [facility] with GM Pre visit scheduled for 12/21/21 at 10 am   The pt has been advised and all information also sent to [facility] Chart per pt request

## 2021-12-18 ENCOUNTER — Encounter (HOSPITAL_COMMUNITY): Payer: Self-pay | Admitting: Gastroenterology

## 2021-12-18 NOTE — Progress Notes (Signed)
Attempted to obtain medical history via telephone, unable to reach at this time. I left a voicemail to return pre surgical testing department's phone call.  

## 2021-12-21 ENCOUNTER — Ambulatory Visit (AMBULATORY_SURGERY_CENTER): Payer: Medicare Other | Admitting: *Deleted

## 2021-12-21 ENCOUNTER — Other Ambulatory Visit: Payer: Self-pay

## 2021-12-21 VITALS — Ht 70.0 in | Wt 186.0 lb

## 2021-12-21 DIAGNOSIS — D3A8 Other benign neuroendocrine tumors: Secondary | ICD-10-CM

## 2021-12-21 DIAGNOSIS — Z86010 Personal history of colonic polyps: Secondary | ICD-10-CM

## 2021-12-21 MED ORDER — PEG 3350-KCL-NA BICARB-NACL 420 G PO SOLR: 4000.0000 mL | Freq: Once | ORAL | 0 refills | Status: AC

## 2021-12-21 NOTE — Progress Notes (Signed)
Patient's pre-visit was done today over the phone with the patient. Name,DOB and address verified. Patient denies any allergies to Eggs and Soy. Patient denies any problems with anesthesia/sedation. Patient is not taking any diet pills or blood thinners. No home Oxygen.   Prep instructions sent to pt's MyChart-pt aware. Patient understands to call us back with any questions or concerns. Patient is aware of our care-partner policy and HNGIT-19 safety protocol. Patient aware not to use Marijuana or chew tobacco the day of procedure.   EMMI education assigned to the patient for the procedure, sent to [place].   The patient is COVID-19 vaccinated.

## 2021-12-24 ENCOUNTER — Telehealth: Payer: Self-pay | Admitting: Gastroenterology

## 2021-12-24 NOTE — Telephone Encounter (Signed)
Inbound call from patient. States he believes he need to cancel procedure 2/27 and reschedule. He have been experiencing fever, cough, diarrhea, and body aches.

## 2021-12-24 NOTE — Telephone Encounter (Signed)
Patient called in to cancel procedure for 2/27 due to being sick. Hospital schedulers have been notified.

## 2021-12-28 ENCOUNTER — Ambulatory Visit (HOSPITAL_COMMUNITY): Admission: RE | Admit: 2021-12-28 | Payer: Medicare Other | Source: Home / Self Care | Admitting: Gastroenterology

## 2021-12-28 SURGERY — COLONOSCOPY WITH PROPOFOL
Anesthesia: Monitor Anesthesia Care

## 2022-01-07 NOTE — Telephone Encounter (Signed)
Spoke with patient regarding rescheduling. Patient is aware that Dr. Donneta Romberg nurse is out of the office until next week, and will call him as soon as there is an opening. Will forward this to [place], Therapist, sports.  ?

## 2022-01-07 NOTE — Telephone Encounter (Signed)
Patient called in to reschedule hospital procedure  ?

## 2022-01-13 ENCOUNTER — Other Ambulatory Visit: Payer: Self-pay

## 2022-01-13 DIAGNOSIS — R1310 Dysphagia, unspecified: Secondary | ICD-10-CM

## 2022-01-13 DIAGNOSIS — K635 Polyp of colon: Secondary | ICD-10-CM

## 2022-01-13 DIAGNOSIS — D3A8 Other benign neuroendocrine tumors: Secondary | ICD-10-CM

## 2022-01-13 DIAGNOSIS — R110 Nausea: Secondary | ICD-10-CM

## 2022-01-13 DIAGNOSIS — R0989 Other specified symptoms and signs involving the circulatory and respiratory systems: Secondary | ICD-10-CM

## 2022-01-13 DIAGNOSIS — R198 Other specified symptoms and signs involving the digestive system and abdomen: Secondary | ICD-10-CM

## 2022-01-13 DIAGNOSIS — K219 Gastro-esophageal reflux disease without esophagitis: Secondary | ICD-10-CM

## 2022-01-13 DIAGNOSIS — R933 Abnormal findings on diagnostic imaging of other parts of digestive tract: Secondary | ICD-10-CM

## 2022-01-13 NOTE — Telephone Encounter (Signed)
The pt appt has been reschedule to 03/04/22 at [facility] with GM at 730 am ?He has been re-instructed and all information sent to My Chart.   ?

## 2022-02-05 ENCOUNTER — Other Ambulatory Visit: Payer: Self-pay

## 2022-02-08 ENCOUNTER — Other Ambulatory Visit: Payer: Self-pay

## 2022-02-08 ENCOUNTER — Other Ambulatory Visit: Payer: Medicare Other

## 2022-02-08 DIAGNOSIS — N529 Male erectile dysfunction, unspecified: Secondary | ICD-10-CM

## 2022-02-08 DIAGNOSIS — Z87898 Personal history of other specified conditions: Secondary | ICD-10-CM

## 2022-02-08 DIAGNOSIS — N138 Other obstructive and reflux uropathy: Secondary | ICD-10-CM

## 2022-02-09 LAB — PSA: Prostate Specific Ag, Serum: 2.2 ng/mL (ref 0.0–4.0)

## 2022-02-09 NOTE — Progress Notes (Signed)
? ? ?02/10/2022 ?11:29 AM  ? ?[name] ?15-Jan-1961 ?[id number] ? ?Referring provider: Ranae Plumber, [place] ?[address] ?[place],  [place] [address] ? ?Chief Complaint  ?Patient presents with  ? Benign Prostatic Hypertrophy  ? Erectile Dysfunction  ? ?Urological history: ?1. ED ?-contributing factors of age, HTN, CHF, DM, HLD and former smoking ? ?2. BPH with LU TS ?-PSA 2.2 in 01/2022 ?-I PSS 21/3 ? ?3. Elevated PSA ?-PSA Trend ?4.7 ng/mL on 08/05/2016  ? Prostate Specific Ag, Serum  ?Latest Ref Rng 0.0 - 4.0 ng/mL  ?07/29/2017 1.9   ?09/04/2018 1.6   ?02/04/2020 2.4   ?08/04/2020 2.4   ?02/03/2021 2.0   ?02/08/2022 2.2   ? ?HPI: ?Jaikob Borgwardt is a 61 y.o. male who presents today for yearly follow up.   ? ?He is having two weeks of frequency, intermittency, straining to urinate and feelings of incomplete emptying.  Patient denies any modifying or aggravating factors.  Patient denies any gross hematuria, dysuria or suprapubic/flank pain.  Patient denies any fevers, chills, nausea or vomiting.   ? ? IPSS   ? ? [place] Name 02/10/22 1100  ?  ?  ?  ? International Prostate Symptom Score  ? How often have you had the sensation of not emptying your bladder? Less than half the time    ? How often have you had to urinate less than every two hours? More than half the time    ? How often have you found you stopped and started again several times when you urinated? More than half the time    ? How often have you found it difficult to postpone urination? Less than 1 in 5 times    ? How often have you had a weak urinary stream? More than half the time    ? How often have you had to strain to start urination? About half the time    ? How many times did you typically get up at night to urinate? 3 Times    ? Total IPSS Score 21    ?  ? Quality of Life due to urinary symptoms  ? If you were to spend the rest of your life with your urinary condition just the way it is now how would you feel about that? Mixed    ? ?  ?  ? ?   ? ? ? ?Score:  ?1-7 Mild ?8-19 Moderate ?20-35 Severe ? ?PMH: ?Past Medical History:  ?Diagnosis Date  ? Anxiety   ? Arthritis   ? knees, hands  ? Cancer [facility])   ? CHF (congestive heart failure) ([place])   ? Diabetes mellitus without complication ([place])   ? type 2  ? ED (erectile dysfunction)   ? Elevated PSA   ? GERD (gastroesophageal reflux disease)   ? Heartburn   ? History of BPH   ? HLD (hyperlipidemia)   ? HTN (hypertension)   ? 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 Pneumonia   ? several times  ? ? ?Surgical History: ?Past Surgical History:  ?Procedure Laterality Date  ? APPENDECTOMY  2005  ? BIOPSY  03/19/2021  ? Procedure: BIOPSY;  Surgeon: Irving Copas., MD;  Location: [place];  Service: Gastroenterology;;  ? [place]  ? c4-c7  ? CHOLECYSTECTOMY  2005  ? COLONOSCOPY WITH PROPOFOL N/A 12/31/2020  ? Procedure: COLONOSCOPY WITH PROPOFOL;  Surgeon: Virgel Manifold, MD;  Location: [facility];  Service: Endoscopy;  Laterality: N/A;  ?  COLONOSCOPY WITH PROPOFOL N/A 03/19/2021  ? Procedure: COLONOSCOPY WITH PROPOFOL;  Surgeon: Mansouraty, Telford Nab., MD;  Location: [place];  Service: Gastroenterology;  Laterality: N/A;  ? ENDOSCOPIC MUCOSAL RESECTION  03/19/2021  ? Procedure: ENDOSCOPIC MUCOSAL RESECTION;  Surgeon: Rush Landmark Telford Nab., MD;  Location: [place];  Service: Gastroenterology;;  ? ESOPHAGOGASTRODUODENOSCOPY (EGD) WITH PROPOFOL N/A 12/31/2020  ? Procedure: ESOPHAGOGASTRODUODENOSCOPY (EGD) WITH PROPOFOL;  Surgeon: Virgel Manifold, MD;  Location: [facility];  Service: Endoscopy;  Laterality: N/A;  ? ESOPHAGOGASTRODUODENOSCOPY (EGD) WITH PROPOFOL N/A 03/19/2021  ? Procedure: ESOPHAGOGASTRODUODENOSCOPY (EGD) WITH PROPOFOL;  Surgeon: Rush Landmark Telford Nab., MD;  Location: [place];  Service: Gastroenterology;  Laterality: N/A;  ? HEMOSTASIS CLIP PLACEMENT  03/19/2021  ? Procedure: HEMOSTASIS CLIP PLACEMENT;  Surgeon: Irving Copas., MD;  Location: [place];  Service: Gastroenterology;;  ? MULTIPLE TOOTH EXTRACTIONS    ? dentures  ? POLYPECTOMY  03/19/2021  ? Procedure: POLYPECTOMY;  Surgeon: Mansouraty, Telford Nab., MD;  Location: [place];  Service: Gastroenterology;;  ? [place] INJECTION  03/19/2021  ? Procedure: SUBMUCOSAL LIFTING INJECTION;  Surgeon: Irving Copas., MD;  Location: [place];  Service: Gastroenterology;;  ? TONSILLECTOMY    ? UPPER ESOPHAGEAL ENDOSCOPIC ULTRASOUND (EUS) N/A 03/19/2021  ? Procedure: UPPER ESOPHAGEAL ENDOSCOPIC ULTRASOUND (EUS);  Surgeon: Irving Copas., MD;  Location: [place];  Service: Gastroenterology;  Laterality: N/A;  ? ? ?Home Medications:  ?Allergies as of 02/10/2022   ?No Known Allergies ?  ? ?  ?Medication List  ?  ? ?  ? Accurate as of February 10, 2022 11:29 AM. If you have any questions, ask your nurse or doctor.  ?  ?  ? ?  ? ?albuterol 108 (90 Base) MCG/ACT inhaler ?Commonly known as: VENTOLIN HFA ?Inhale 2 puffs into the lungs every 6 (six) hours as needed for wheezing or shortness of breath. ?  ?amLODipine 10 MG tablet ?Commonly known as: NORVASC ?Take 10 mg by mouth daily. ?  ?aspirin 81 MG EC tablet ?Take 1 tablet (81 mg total) by mouth daily. ?  ?cyclobenzaprine 10 MG tablet ?Commonly known as: FLEXERIL ?Take 10 mg by mouth 3 (three) times daily as needed for muscle spasms. ?  ?enalapril 2.5 MG tablet ?Commonly known as: VASOTEC ?Take 5 mg by mouth daily. ?  ?gabapentin 300 MG capsule ?Commonly known as: NEURONTIN ?Take 300 mg by mouth 2 (two) times daily. ?  ?metFORMIN 1000 MG tablet ?Commonly known as: GLUCOPHAGE ?Take 1,000 mg by mouth daily with breakfast. ?  ?metoprolol succinate 50 MG 24 hr tablet ?Commonly known as: TOPROL-XL ?Take 50 mg by mouth daily. Take with or immediately following a meal. ?  ?omeprazole 20 MG tablet ?Commonly known as: PRILOSEC OTC ?Take 2 tablets (40 mg total) by mouth 2 (two) times daily for 28 days. ?What  changed:  ?how much to take ?when to take this ?  ?ondansetron 4 MG tablet ?Commonly known as: Zofran ?Take 1 tablet (4 mg total) by mouth every 8 (eight) hours as needed for nausea or vomiting. ?  ?sildenafil 20 MG tablet ?Commonly known as: REVATIO ?Take 3 to 5 tablets two hours before intercouse on an empty stomach.  Do not take with nitrates. ?  ?tamsulosin 0.4 MG Caps capsule ?Commonly known as: FLOMAX ?Take 1 capsule (0.4 mg total) by mouth daily. ?  ? ?  ? ? ?Allergies: No Known Allergies ? ?Family History: ?Family History  ?Problem Relation Age of Onset  ? Kidney cancer Maternal  Grandmother   ? Liver cancer Maternal Grandmother   ? Stroke Father   ? Cancer Father   ?     unknown origin  ? Hypertension Mother   ? Diabetes Mother   ? Osteoarthritis Mother   ? Osteoporosis Mother   ? Lung cancer Maternal Grandfather   ? Kidney disease Neg Hx   ? Prostate cancer Neg Hx   ? Bladder Cancer Neg Hx   ? Colon cancer Neg Hx   ? Esophageal cancer Neg Hx   ? Inflammatory bowel disease Neg Hx   ? Pancreatic cancer Neg Hx   ? Rectal cancer Neg Hx   ? Stomach cancer Neg Hx   ? ? ?Social History:  reports that he quit smoking about 17 years ago. His smoking use included cigarettes. His smokeless tobacco use includes chew. He reports that he does not currently use alcohol after a past usage of about 1.0 standard drink per week. He reports current drug use. Frequency: 1.00 time per week. Drug: Marijuana. ? ?ROS: ?For pertinent review of systems please refer to history of present illness ? ?Physical Exam: ?BP 130/69   Pulse 70   Ht '5\' 9"'$  (1.753 m)   Wt 181 lb (82.1 kg)   BMI 26.73 kg/m?   ?Constitutional:  Well nourished. Alert and oriented, No acute distress. ?HEENT: [place] AT, moist mucus membranes.  Trachea midline ?Cardiovascular: No clubbing, cyanosis, or edema. ?Respiratory: Normal respiratory effort, no increased work of breathing. ?GU: No CVA tenderness.  No bladder fullness or masses.  Patient with circumcised  phallus.  Urethral meatus is patent.  No penile discharge. No penile lesions or rashes. Scrotum without lesions, cysts, rashes and/or edema.  Testicles are located scrotally bilaterally. No masses are appreciated in the testicles

## 2022-02-10 ENCOUNTER — Ambulatory Visit (INDEPENDENT_AMBULATORY_CARE_PROVIDER_SITE_OTHER): Payer: Medicare Other | Admitting: Urology

## 2022-02-10 ENCOUNTER — Encounter: Payer: Self-pay | Admitting: Urology

## 2022-02-10 VITALS — BP 130/69 | HR 70 | Ht 69.0 in | Wt 181.0 lb

## 2022-02-10 DIAGNOSIS — N401 Enlarged prostate with lower urinary tract symptoms: Secondary | ICD-10-CM

## 2022-02-10 DIAGNOSIS — N138 Other obstructive and reflux uropathy: Secondary | ICD-10-CM | POA: Diagnosis not present

## 2022-02-10 DIAGNOSIS — N529 Male erectile dysfunction, unspecified: Secondary | ICD-10-CM | POA: Diagnosis not present

## 2022-02-10 MED ORDER — TAMSULOSIN HCL 0.4 MG PO CAPS: 0.4000 mg | ORAL_CAPSULE | Freq: Every day | ORAL | 3 refills | Status: DC

## 2022-02-19 ENCOUNTER — Telehealth: Payer: Self-pay | Admitting: Gastroenterology

## 2022-02-19 DIAGNOSIS — K635 Polyp of colon: Secondary | ICD-10-CM

## 2022-02-19 MED ORDER — PEG 3350-KCL-NA BICARB-NACL 420 G PO SOLR: 4000.0000 mL | Freq: Once | ORAL | 0 refills | Status: AC

## 2022-02-19 NOTE — Telephone Encounter (Signed)
Patient requested the Golyt[place] prep RX be resent to pharmacy. Resent RX at this time and sent cone prep instructions via Mychart-pt is aware. Patient will call us back with any questions.  ?

## 2022-02-19 NOTE — Telephone Encounter (Signed)
Inbound call from patient stating that he needs a new prep for his procedure on 5/4. Please advise.  ?

## 2022-03-01 ENCOUNTER — Encounter (HOSPITAL_COMMUNITY): Payer: Self-pay | Admitting: Gastroenterology

## 2022-03-04 ENCOUNTER — Encounter (HOSPITAL_COMMUNITY): Payer: Self-pay | Admitting: Certified Registered"

## 2022-03-04 ENCOUNTER — Ambulatory Visit (HOSPITAL_COMMUNITY)
Admission: RE | Admit: 2022-03-04 | Discharge: 2022-03-04 | Disposition: A | Discharge status: Home / Self Care | Payer: Medicare Other | Attending: Gastroenterology | Admitting: Gastroenterology

## 2022-03-04 ENCOUNTER — Encounter (HOSPITAL_COMMUNITY)
Admission: RE | Disposition: A | Discharge status: Home / Self Care | Payer: Self-pay | Source: Home / Self Care | Attending: Gastroenterology

## 2022-03-04 DIAGNOSIS — K635 Polyp of colon: Secondary | ICD-10-CM | POA: Insufficient documentation

## 2022-03-04 DIAGNOSIS — Z538 Procedure and treatment not carried out for other reasons: Secondary | ICD-10-CM | POA: Insufficient documentation

## 2022-03-04 SURGERY — CANCELLED PROCEDURE

## 2022-03-04 SURGICAL SUPPLY — 25 items

## 2022-03-04 NOTE — Progress Notes (Signed)
Pt canceled due to physician illness. Patient aware and office to get pt rescheduled. ? ?Vista Lawman, RN ?

## 2022-03-08 ENCOUNTER — Telehealth: Payer: Self-pay

## 2022-03-08 NOTE — Telephone Encounter (Signed)
-----   Message from Irving Copas., MD sent at 03/07/2022  9:14 PM EDT ----- ?Regarding: RE: pt canceled 5/4 ?SL, ?Thank you for sending this message. ?Keiasha Diep, ?Please schedule this patient's procedures next available. ?Thanks. ?GM ?----- Message ----- ?From: Darral Dash, RN ?Sent: 03/04/2022   7:30 AM EDT ?To: Irving Copas., MD ?Subject: pt canceled 5/4                               ? ?Hey, This patient was scheduled for 0730 procedure on 5/4.  He is aware the office will be calling to reschedule.   ? ?Larene Beach RN ? ? ?

## 2022-03-08 NOTE — Telephone Encounter (Signed)
Left message on machine to call back  ?New instructions mailed to the home and sent to My Chart ?

## 2022-03-08 NOTE — Telephone Encounter (Signed)
Colon EMR EGD has been rescheduled to 7/13 at [facility] at 1045 am with GM  ?

## 2022-03-09 MED ORDER — PLENVU 140 G PO SOLR: 1.0000 | ORAL | 0 refills | Status: DC

## 2022-03-09 NOTE — Telephone Encounter (Signed)
Left message on machine to call back  ?Instructions sent to My Chart ?

## 2022-03-09 NOTE — Telephone Encounter (Signed)
Patient returned your call.

## 2022-03-09 NOTE — Telephone Encounter (Signed)
The pt has been advised of the new date and time for procedure.  I have also sent a copy to the pt mail and My Chart.  New prescription for plenvu has been sent to the pharmacy for pick up.   ?

## 2022-03-15 NOTE — Progress Notes (Signed)
? ? ?03/16/2022 ?11:50 AM  ? ?Kevin Esparza ?Feb 20, 1961 ?[id number] ? ?Referring provider: Ranae Plumber, [place] ?[address] ?[place],  [place] [address] ? ?Chief Complaint  ?Patient presents with  ? Follow-up  ? ?Urological history: ?1. ED ?-contributing factors of age, HTN, CHF, DM, HLD and former smoking ? ?2. BPH with LU TS ?-PSA 2.2 in 01/2022 ?-I PSS 13/3 ?-PVR 9 mL  ? ?3. Elevated PSA ?-PSA Trend ?4.7 ng/mL on 08/05/2016  ? Prostate Specific Ag, Serum  ?Latest Ref Rng 0.0 - 4.0 ng/mL  ?07/29/2017 1.9   ?09/04/2018 1.6   ?02/04/2020 2.4   ?08/04/2020 2.4   ?02/03/2021 2.0   ?02/08/2022 2.2   ? ?HPI: ?Kevin Esparza is a 61 y.o. male who presents today for yearly follow up.   ? ?He has noticed 100% turnaround since starting the tamsulosin.  He has noticed a decrease in urinary frequency, increase in the strength of his stream and more complete bladder emptying.  Patient denies any modifying or aggravating factors.  Patient denies any gross hematuria, dysuria or suprapubic/flank pain.  Patient denies any fevers, chills, nausea or vomiting.   ? ? IPSS   ? ? [place] Name 03/16/22 1100  ?  ?  ?  ? International Prostate Symptom Score  ? How often have you had the sensation of not emptying your bladder? Less than half the time    ? How often have you had to urinate less than every two hours? Less than 1 in 5 times    ? How often have you found you stopped and started again several times when you urinated? Less than half the time    ? How often have you found it difficult to postpone urination? Less than half the time    ? How often have you had a weak urinary stream? About half the time    ? How often have you had to strain to start urination? Less than 1 in 5 times    ? How many times did you typically get up at night to urinate? 2 Times    ? Total IPSS Score 13    ?  ? Quality of Life due to urinary symptoms  ? If you were to spend the rest of your life with your urinary condition just the way it is now how would you feel  about that? Mixed    ? ?  ?  ? ?  ? ? ? ? ?Score:  ?1-7 Mild ?8-19 Moderate ?20-35 Severe ? ?PMH: ?Past Medical History:  ?Diagnosis Date  ? Anxiety   ? Arthritis   ? knees, hands  ? Cancer [facility])   ? CHF (congestive heart failure) ([place])   ? Diabetes mellitus without complication ([place])   ? type 2  ? ED (erectile dysfunction)   ? Elevated PSA   ? GERD (gastroesophageal reflux disease)   ? Heartburn   ? History of BPH   ? HLD (hyperlipidemia)   ? HTN (hypertension)   ? 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 Pneumonia   ? several times  ? ? ?Surgical History: ?Past Surgical History:  ?Procedure Laterality Date  ? APPENDECTOMY  2005  ? BIOPSY  03/19/2021  ? Procedure: BIOPSY;  Surgeon: Irving Copas., MD;  Location: [place];  Service: Gastroenterology;;  ? [place]  ? c4-c7  ? CHOLECYSTECTOMY  2005  ? COLONOSCOPY WITH PROPOFOL N/A 12/31/2020  ? Procedure: COLONOSCOPY WITH PROPOFOL;  Surgeon:  Virgel Manifold, MD;  Location: [facility];  Service: Endoscopy;  Laterality: N/A;  ? COLONOSCOPY WITH PROPOFOL N/A 03/19/2021  ? Procedure: COLONOSCOPY WITH PROPOFOL;  Surgeon: Mansouraty, Telford Nab., MD;  Location: [place];  Service: Gastroenterology;  Laterality: N/A;  ? ENDOSCOPIC MUCOSAL RESECTION  03/19/2021  ? Procedure: ENDOSCOPIC MUCOSAL RESECTION;  Surgeon: Rush Landmark Telford Nab., MD;  Location: [place];  Service: Gastroenterology;;  ? ESOPHAGOGASTRODUODENOSCOPY (EGD) WITH PROPOFOL N/A 12/31/2020  ? Procedure: ESOPHAGOGASTRODUODENOSCOPY (EGD) WITH PROPOFOL;  Surgeon: Virgel Manifold, MD;  Location: [facility];  Service: Endoscopy;  Laterality: N/A;  ? ESOPHAGOGASTRODUODENOSCOPY (EGD) WITH PROPOFOL N/A 03/19/2021  ? Procedure: ESOPHAGOGASTRODUODENOSCOPY (EGD) WITH PROPOFOL;  Surgeon: Rush Landmark Telford Nab., MD;  Location: [place];  Service: Gastroenterology;  Laterality: N/A;  ? HEMOSTASIS CLIP PLACEMENT  03/19/2021  ? Procedure: HEMOSTASIS CLIP  PLACEMENT;  Surgeon: Irving Copas., MD;  Location: [place];  Service: Gastroenterology;;  ? MULTIPLE TOOTH EXTRACTIONS    ? dentures  ? POLYPECTOMY  03/19/2021  ? Procedure: POLYPECTOMY;  Surgeon: Mansouraty, Telford Nab., MD;  Location: [place];  Service: Gastroenterology;;  ? [place] INJECTION  03/19/2021  ? Procedure: SUBMUCOSAL LIFTING INJECTION;  Surgeon: Irving Copas., MD;  Location: [place];  Service: Gastroenterology;;  ? TONSILLECTOMY    ? UPPER ESOPHAGEAL ENDOSCOPIC ULTRASOUND (EUS) N/A 03/19/2021  ? Procedure: UPPER ESOPHAGEAL ENDOSCOPIC ULTRASOUND (EUS);  Surgeon: Irving Copas., MD;  Location: [place];  Service: Gastroenterology;  Laterality: N/A;  ? ? ?Home Medications:  ?Allergies as of 03/16/2022   ?No Known Allergies ?  ? ?  ?Medication List  ?  ? ?  ? Accurate as of Mar 16, 2022 11:50 AM. If you have any questions, ask your nurse or doctor.  ?  ?  ? ?  ? ?STOP taking these medications   ? ?ondansetron 4 MG tablet ?Commonly known as: Zofran ?Stopped by: Zara Council, PA-C ?  ? ?  ? ?TAKE these medications   ? ?acetaminophen 650 MG CR tablet ?Commonly known as: TYLENOL ?Take 1,300 mg by mouth 2 (two) times daily. ?  ?albuterol 108 (90 Base) MCG/ACT inhaler ?Commonly known as: VENTOLIN HFA ?Inhale 2 puffs into the lungs every 6 (six) hours as needed for wheezing or shortness of breath. ?  ?amLODipine 10 MG tablet ?Commonly known as: NORVASC ?Take 10 mg by mouth daily. ?  ?aspirin 81 MG EC tablet ?Take 1 tablet (81 mg total) by mouth daily. ?  ?cyclobenzaprine 10 MG tablet ?Commonly known as: FLEXERIL ?Take 10 mg by mouth 3 (three) times daily as needed for muscle spasms. ?  ?diclofenac Sodium 1 % Gel ?Commonly known as: VOLTAREN ?Apply 2 g topically daily as needed (pain). ?  ?enalapril 2.5 MG tablet ?Commonly known as: VASOTEC ?Take 5 mg by mouth daily. ?  ?gabapentin 300 MG capsule ?Commonly known as: NEURONTIN ?Take 300 mg by mouth 3 (three)  times daily. ?  ?HYDROcodone-acetaminophen 5-325 MG tablet ?Commonly known as: NORCO/VICODIN ?Take by mouth. ?  ?metFORMIN 1000 MG tablet ?Commonly known as: GLUCOPHAGE ?Take 1,000 mg by mouth daily with breakfast. ?  ?metoprolol succinate 50 MG 24 hr tablet ?Commonly known as: TOPROL-XL ?Take 50 mg by mouth daily. Take with or immediately following a meal. ?  ?omeprazole 20 MG tablet ?Commonly known as: PRILOSEC OTC ?Take 2 tablets (40 mg total) by mouth 2 (two) times daily for 28 days. ?What changed:  ?how much to take ?when to take this ?  ?Plenvu 140  g Solr ?Generic drug: PEG-KCl-NaCl-NaSulf-Na Asc-C ?Take 1 kit by mouth as directed. Use coupon: BIN: [id number] PNC: CNRX Group: [id number] ID: [id number] ?  ?tamsulosin 0.4 MG Caps capsule ?Commonly known as: FLOMAX ?Take 1 capsule (0.4 mg total) by mouth daily. ?  ? ?  ? ? ?Allergies: No Known Allergies ? ?Family History: ?Family History  ?Problem Relation Age of Onset  ? Kidney cancer Maternal Grandmother   ? Liver cancer Maternal Grandmother   ? Stroke Father   ? Cancer Father   ?     unknown origin  ? Hypertension Mother   ? Diabetes Mother   ? Osteoarthritis Mother   ? Osteoporosis Mother   ? Lung cancer Maternal Grandfather   ? Kidney disease Neg Hx   ? Prostate cancer Neg Hx   ? Bladder Cancer Neg Hx   ? Colon cancer Neg Hx   ? Esophageal cancer Neg Hx   ? Inflammatory bowel disease Neg Hx   ? Pancreatic cancer Neg Hx   ? Rectal cancer Neg Hx   ? Stomach cancer Neg Hx   ? ? ?Social History:  reports that he quit smoking about 18 years ago. His smoking use included cigarettes. His smokeless tobacco use includes chew. He reports that he does not currently use alcohol after a past usage of about 1.0 standard drink per week. He reports current drug use. Frequency: 1.00 time per week. Drug: Marijuana. ? ?ROS: ?For pertinent review of systems please refer to history of present illness ? ?Physical Exam: ?BP 122/70 (BP Location: Left Arm, Patient Position: Sitting,  Cuff Size: Normal)   Pulse 67   Ht $R'5\' 9"'ZI$  (1.753 m)   Wt 179 lb 1.6 oz (81.2 kg)   BMI 26.45 kg/m?   ?Constitutional:  Well nourished. Alert and oriented, No acute distress. ?HEENT: [place] AT, moist mucus membr

## 2022-03-16 ENCOUNTER — Encounter: Payer: Self-pay | Admitting: Urology

## 2022-03-16 ENCOUNTER — Ambulatory Visit (INDEPENDENT_AMBULATORY_CARE_PROVIDER_SITE_OTHER): Payer: Medicare Other | Admitting: Urology

## 2022-03-16 VITALS — BP 122/70 | HR 67 | Ht 69.0 in | Wt 179.1 lb

## 2022-03-16 DIAGNOSIS — N401 Enlarged prostate with lower urinary tract symptoms: Secondary | ICD-10-CM

## 2022-03-16 DIAGNOSIS — N138 Other obstructive and reflux uropathy: Secondary | ICD-10-CM

## 2022-03-16 LAB — BLADDER SCAN AMB NON-IMAGING

## 2022-03-16 MED ORDER — TAMSULOSIN HCL 0.4 MG PO CAPS: 0.4000 mg | ORAL_CAPSULE | Freq: Every day | ORAL | 3 refills | Status: DC

## 2022-03-24 ENCOUNTER — Encounter: Payer: Self-pay | Admitting: Family Medicine

## 2022-03-24 ENCOUNTER — Observation Stay
Admission: EM | Admit: 2022-03-24 | Discharge: 2022-03-25 | Disposition: A | Discharge status: Home / Self Care | Payer: Medicare Other | Attending: Student in an Organized Health Care Education/Training Program | Admitting: Student in an Organized Health Care Education/Training Program

## 2022-03-24 ENCOUNTER — Emergency Department: Payer: Medicare Other

## 2022-03-24 ENCOUNTER — Other Ambulatory Visit: Payer: Self-pay

## 2022-03-24 DIAGNOSIS — Z85028 Personal history of other malignant neoplasm of stomach: Secondary | ICD-10-CM | POA: Diagnosis not present

## 2022-03-24 DIAGNOSIS — E785 Hyperlipidemia, unspecified: Secondary | ICD-10-CM | POA: Diagnosis not present

## 2022-03-24 DIAGNOSIS — I319 Disease of pericardium, unspecified: Secondary | ICD-10-CM | POA: Insufficient documentation

## 2022-03-24 DIAGNOSIS — Z20822 Contact with and (suspected) exposure to covid-19: Secondary | ICD-10-CM | POA: Insufficient documentation

## 2022-03-24 DIAGNOSIS — R079 Chest pain, unspecified: Secondary | ICD-10-CM

## 2022-03-24 DIAGNOSIS — I509 Heart failure, unspecified: Secondary | ICD-10-CM | POA: Insufficient documentation

## 2022-03-24 DIAGNOSIS — Z7982 Long term (current) use of aspirin: Secondary | ICD-10-CM | POA: Insufficient documentation

## 2022-03-24 DIAGNOSIS — E119 Type 2 diabetes mellitus without complications: Secondary | ICD-10-CM | POA: Insufficient documentation

## 2022-03-24 DIAGNOSIS — I110 Hypertensive heart disease with heart failure: Secondary | ICD-10-CM | POA: Insufficient documentation

## 2022-03-24 DIAGNOSIS — Z79899 Other long term (current) drug therapy: Secondary | ICD-10-CM | POA: Diagnosis not present

## 2022-03-24 DIAGNOSIS — Z7984 Long term (current) use of oral hypoglycemic drugs: Secondary | ICD-10-CM | POA: Diagnosis not present

## 2022-03-24 DIAGNOSIS — I10 Essential (primary) hypertension: Secondary | ICD-10-CM | POA: Diagnosis not present

## 2022-03-24 DIAGNOSIS — R0789 Other chest pain: Secondary | ICD-10-CM | POA: Diagnosis not present

## 2022-03-24 DIAGNOSIS — R072 Precordial pain: Secondary | ICD-10-CM | POA: Diagnosis not present

## 2022-03-24 DIAGNOSIS — Z87891 Personal history of nicotine dependence: Secondary | ICD-10-CM | POA: Diagnosis not present

## 2022-03-24 LAB — BASIC METABOLIC PANEL
Anion gap: 13 (ref 5–15)
BUN: 6 mg/dL — ABNORMAL LOW (ref 8–23)
CO2: 25 mmol/L (ref 22–32)
Calcium: 9.4 mg/dL (ref 8.9–10.3)
Chloride: 97 mmol/L — ABNORMAL LOW (ref 98–111)
Creatinine, Ser: 1.07 mg/dL (ref 0.61–1.24)
GFR, Estimated: >60 mL/min (ref >60)
Glucose, Bld: 186 mg/dL — ABNORMAL HIGH (ref 70–99)
Potassium: 3.5 mmol/L (ref 3.5–5.1)
Sodium: 135 mmol/L (ref 135–145)

## 2022-03-24 LAB — URINE DRUG SCREEN, QUALITATIVE (ARMC ONLY)
Amphetamines, Ur Screen: NOT DETECTED
Barbiturates, Ur Screen: NOT DETECTED
Benzodiazepine, Ur Scrn: NOT DETECTED
Cannabinoid 50 Ng, Ur ~~LOC~~: POSITIVE — AB
Cocaine Metabolite,Ur ~~LOC~~: NOT DETECTED
MDMA (Ecstasy)Ur Screen: NOT DETECTED
Methadone Scn, Ur: NOT DETECTED
Opiate, Ur Screen: POSITIVE — AB
Phencyclidine (PCP) Ur S: NOT DETECTED
Tricyclic, Ur Screen: NOT DETECTED

## 2022-03-24 LAB — CBC
HCT: 46 % (ref 39.0–52.0)
HCT: 37.1 % — ABNORMAL LOW (ref 39.0–52.0)
Hemoglobin: 15.5 g/dL (ref 13.0–17.0)
Hemoglobin: 12.6 g/dL — ABNORMAL LOW (ref 13.0–17.0)
MCH: 28.3 pg (ref 26.0–34.0)
MCH: 28.7 pg (ref 26.0–34.0)
MCHC: 33.7 g/dL (ref 30.0–36.0)
MCHC: 34 g/dL (ref 30.0–36.0)
MCV: 83.9 fL (ref 80.0–100.0)
MCV: 84.5 fL (ref 80.0–100.0)
Platelets: 220 10*3/uL (ref 150–400)
Platelets: 163 10*3/uL (ref 150–400)
RBC: 5.48 MIL/uL (ref 4.22–5.81)
RBC: 4.39 MIL/uL (ref 4.22–5.81)
RDW: 12.7 % (ref 11.5–15.5)
RDW: 12.7 % (ref 11.5–15.5)
WBC: 11.5 10*3/uL — ABNORMAL HIGH (ref 4.0–10.5)
WBC: 9.1 10*3/uL (ref 4.0–10.5)
nRBC: 0 % (ref 0.0–0.2)
nRBC: 0 % (ref 0.0–0.2)

## 2022-03-24 LAB — CREATININE, SERUM
Creatinine, Ser: 1.14 mg/dL (ref 0.61–1.24)
GFR, Estimated: >60 mL/min (ref >60)

## 2022-03-24 LAB — RESP PANEL BY RT-PCR (FLU A&B, COVID) ARPGX2
Influenza A by PCR: NEGATIVE
Influenza B by PCR: NEGATIVE
SARS Coronavirus 2 by RT PCR: NEGATIVE

## 2022-03-24 LAB — HEPATIC FUNCTION PANEL
ALT: 23 U/L (ref 0–44)
AST: 24 U/L (ref 15–41)
Albumin: 3.9 g/dL (ref 3.5–5.0)
Alkaline Phosphatase: 83 U/L (ref 38–126)
Bilirubin, Direct: 0.3 mg/dL — ABNORMAL HIGH (ref 0.0–0.2)
Indirect Bilirubin: 1.5 mg/dL — ABNORMAL HIGH (ref 0.3–0.9)
Total Bilirubin: 1.8 mg/dL — ABNORMAL HIGH (ref 0.3–1.2)
Total Protein: 7.9 g/dL (ref 6.5–8.1)

## 2022-03-24 LAB — TSH: TSH: 3.354 u[IU]/mL (ref 0.350–4.500)

## 2022-03-24 LAB — GLUCOSE, CAPILLARY
Glucose-Capillary: 174 mg/dL — ABNORMAL HIGH (ref 70–99)
Glucose-Capillary: 148 mg/dL — ABNORMAL HIGH (ref 70–99)
Glucose-Capillary: 120 mg/dL — ABNORMAL HIGH (ref 70–99)

## 2022-03-24 LAB — SEDIMENTATION RATE: Sed Rate: 25 mm/hr — ABNORMAL HIGH (ref 0–20)

## 2022-03-24 LAB — TROPONIN I (HIGH SENSITIVITY)
Troponin I (High Sensitivity): 6 ng/L (ref <18)
Troponin I (High Sensitivity): 6 ng/L (ref <18)
Troponin I (High Sensitivity): 7 ng/L (ref <18)

## 2022-03-24 LAB — LIPASE, BLOOD: Lipase: 27 U/L (ref 11–51)

## 2022-03-24 MED ORDER — HEPARIN SODIUM (PORCINE) 5000 UNIT/ML IJ SOLN
5000.0000 [IU] | Freq: Three times a day (TID) | INTRAMUSCULAR | Status: DC
  Administered 2022-03-24: 5000 [IU] via SUBCUTANEOUS
  Filled 2022-03-24: qty 1

## 2022-03-24 MED ORDER — NITROGLYCERIN 0.4 MG SL SUBL
0.4000 mg | SUBLINGUAL_TABLET | SUBLINGUAL | Status: DC | PRN
  Administered 2022-03-24: 0.4 mg via SUBLINGUAL
  Filled 2022-03-24: qty 1

## 2022-03-24 MED ORDER — MORPHINE SULFATE (PF) 4 MG/ML IV SOLN: 4.0000 mg | INTRAVENOUS | Status: DC | PRN

## 2022-03-24 MED ORDER — IPRATROPIUM-ALBUTEROL 0.5-2.5 (3) MG/3ML IN SOLN
3.0000 mL | Freq: Once | RESPIRATORY_TRACT | Status: AC
  Administered 2022-03-24: 3 mL via RESPIRATORY_TRACT
  Filled 2022-03-24: qty 3

## 2022-03-24 MED ORDER — INSULIN ASPART 100 UNIT/ML IJ SOLN: 0.0000 [IU] | Freq: Three times a day (TID) | INTRAMUSCULAR | Status: DC

## 2022-03-24 MED ORDER — ALBUTEROL SULFATE (2.5 MG/3ML) 0.083% IN NEBU: 3.0000 mL | INHALATION_SOLUTION | Freq: Four times a day (QID) | RESPIRATORY_TRACT | Status: DC | PRN

## 2022-03-24 MED ORDER — METOPROLOL SUCCINATE ER 50 MG PO TB24
50.0000 mg | ORAL_TABLET | Freq: Every day | ORAL | Status: DC
  Administered 2022-03-24 – 2022-03-25 (×2): 50 mg via ORAL
  Filled 2022-03-24 (×2): qty 1

## 2022-03-24 MED ORDER — MORPHINE SULFATE (PF) 4 MG/ML IV SOLN
4.0000 mg | INTRAVENOUS | Status: DC | PRN
  Administered 2022-03-24: 4 mg via INTRAVENOUS
  Filled 2022-03-24: qty 1

## 2022-03-24 MED ORDER — LIDOCAINE VISCOUS HCL 2 % MT SOLN
15.0000 mL | Freq: Once | OROMUCOSAL | Status: AC
Start: 2022-03-24 — End: 2022-03-24
  Administered 2022-03-24: 15 mL via ORAL
  Filled 2022-03-24: qty 15

## 2022-03-24 MED ORDER — ENALAPRIL MALEATE 5 MG PO TABS
5.0000 mg | ORAL_TABLET | Freq: Every day | ORAL | Status: DC
  Administered 2022-03-25: 5 mg via ORAL
  Filled 2022-03-24 (×2): qty 1

## 2022-03-24 MED ORDER — INSULIN ASPART 100 UNIT/ML IJ SOLN
0.0000 [IU] | INTRAMUSCULAR | Status: DC
  Administered 2022-03-24: 3 [IU] via SUBCUTANEOUS
  Administered 2022-03-25 (×2): 2 [IU] via SUBCUTANEOUS
  Filled 2022-03-24 (×3): qty 1

## 2022-03-24 MED ORDER — ASPIRIN 81 MG PO TBEC
81.0000 mg | DELAYED_RELEASE_TABLET | Freq: Every day | ORAL | Status: DC
  Administered 2022-03-24 – 2022-03-25 (×2): 81 mg via ORAL
  Filled 2022-03-24 (×2): qty 1

## 2022-03-24 MED ORDER — AMLODIPINE BESYLATE 10 MG PO TABS
10.0000 mg | ORAL_TABLET | Freq: Every day | ORAL | Status: DC
  Administered 2022-03-25: 10 mg via ORAL
  Filled 2022-03-24: qty 1

## 2022-03-24 MED ORDER — TAMSULOSIN HCL 0.4 MG PO CAPS
0.4000 mg | ORAL_CAPSULE | Freq: Every day | ORAL | Status: DC
  Administered 2022-03-24 – 2022-03-25 (×2): 0.4 mg via ORAL
  Filled 2022-03-24 (×2): qty 1

## 2022-03-24 MED ORDER — SUCRALFATE 1 G PO TABS
1.0000 g | ORAL_TABLET | Freq: Three times a day (TID) | ORAL | Status: DC
  Administered 2022-03-24 – 2022-03-25 (×3): 1 g via ORAL
  Filled 2022-03-24 (×3): qty 1

## 2022-03-24 MED ORDER — METOPROLOL TARTRATE 5 MG/5ML IV SOLN
5.0000 mg | Freq: Once | INTRAVENOUS | Status: AC
  Administered 2022-03-24: 5 mg via INTRAVENOUS
  Filled 2022-03-24 (×2): qty 5

## 2022-03-24 MED ORDER — IOHEXOL 350 MG/ML SOLN
75.0000 mL | Freq: Once | INTRAVENOUS | Status: AC | PRN
  Administered 2022-03-24: 75 mL via INTRAVENOUS

## 2022-03-24 MED ORDER — SODIUM CHLORIDE 0.9 % IV BOLUS
1000.0000 mL | Freq: Once | INTRAVENOUS | Status: AC
  Administered 2022-03-24: 1000 mL via INTRAVENOUS

## 2022-03-24 MED ORDER — PANTOPRAZOLE SODIUM 40 MG IV SOLR
40.0000 mg | Freq: Two times a day (BID) | INTRAVENOUS | Status: DC
  Administered 2022-03-24 – 2022-03-25 (×2): 40 mg via INTRAVENOUS
  Filled 2022-03-24 (×2): qty 10

## 2022-03-24 MED ORDER — ONDANSETRON HCL 4 MG/2ML IJ SOLN
4.0000 mg | Freq: Four times a day (QID) | INTRAMUSCULAR | Status: DC | PRN
  Administered 2022-03-25: 4 mg via INTRAVENOUS
  Filled 2022-03-24: qty 2

## 2022-03-24 MED ORDER — CYCLOBENZAPRINE HCL 10 MG PO TABS: 10.0000 mg | ORAL_TABLET | Freq: Three times a day (TID) | ORAL | Status: DC | PRN

## 2022-03-24 MED ORDER — KETOROLAC TROMETHAMINE 30 MG/ML IJ SOLN
15.0000 mg | Freq: Once | INTRAMUSCULAR | Status: AC
  Administered 2022-03-24: 15 mg via INTRAVENOUS
  Filled 2022-03-24: qty 1

## 2022-03-24 MED ORDER — ZOLPIDEM TARTRATE 5 MG PO TABS: 5.0000 mg | ORAL_TABLET | Freq: Every evening | ORAL | Status: DC | PRN

## 2022-03-24 MED ORDER — ALUM & MAG HYDROXIDE-SIMETH 200-200-20 MG/5ML PO SUSP
30.0000 mL | Freq: Once | ORAL | Status: AC
  Administered 2022-03-24: 30 mL via ORAL
  Filled 2022-03-24: qty 30

## 2022-03-24 MED ORDER — ALPRAZOLAM 0.25 MG PO TABS: 0.2500 mg | ORAL_TABLET | Freq: Two times a day (BID) | ORAL | Status: DC | PRN

## 2022-03-24 MED ORDER — ONDANSETRON HCL 4 MG/2ML IJ SOLN
4.0000 mg | Freq: Once | INTRAMUSCULAR | Status: AC
  Administered 2022-03-24: 4 mg via INTRAVENOUS
  Filled 2022-03-24: qty 2

## 2022-03-24 MED ORDER — ACETAMINOPHEN 325 MG PO TABS
650.0000 mg | ORAL_TABLET | ORAL | Status: DC | PRN
  Administered 2022-03-24: 650 mg via ORAL
  Filled 2022-03-24: qty 2

## 2022-03-24 MED ORDER — HYDROMORPHONE HCL 1 MG/ML IJ SOLN
0.5000 mg | INTRAMUSCULAR | Status: DC | PRN
  Administered 2022-03-24 – 2022-03-25 (×5): 0.5 mg via INTRAVENOUS
  Filled 2022-03-24 (×3): qty 1
  Filled 2022-03-24 (×2): qty 0.5

## 2022-03-24 MED ORDER — ATORVASTATIN CALCIUM 20 MG PO TABS
40.0000 mg | ORAL_TABLET | Freq: Every day | ORAL | Status: DC
  Administered 2022-03-24 – 2022-03-25 (×2): 40 mg via ORAL
  Filled 2022-03-24 (×2): qty 2

## 2022-03-24 MED ORDER — PANTOPRAZOLE SODIUM 40 MG IV SOLR
40.0000 mg | Freq: Every day | INTRAVENOUS | Status: DC
  Administered 2022-03-24: 40 mg via INTRAVENOUS
  Filled 2022-03-24: qty 10

## 2022-03-24 MED ORDER — GABAPENTIN 300 MG PO CAPS
300.0000 mg | ORAL_CAPSULE | Freq: Every day | ORAL | Status: DC
  Administered 2022-03-24: 300 mg via ORAL
  Filled 2022-03-24: qty 1

## 2022-03-24 MED ORDER — SODIUM CHLORIDE 0.9 % IV BOLUS
500.0000 mL | Freq: Once | INTRAVENOUS | Status: AC
  Administered 2022-03-24: 500 mL via INTRAVENOUS

## 2022-03-24 NOTE — ED Notes (Signed)
Patient transported to CT 

## 2022-03-24 NOTE — Progress Notes (Signed)
Admission profile updated. ?

## 2022-03-24 NOTE — Consult Note (Signed)
Cardiology Consultation:   Patient ID: Kevin Esparza MRN: [id number]; DOB: Nov 09, 1960  Admit date: 03/24/2022 Date of Consult: 03/24/2022  PCP:  Shane Crutch, PA   [facility] Providers Cardiologist: Debbe Odea, MD Click here to update MD or APP on Care Team, Refresh:1}     Patient Profile:   Kevin Esparza is a 61 y.o. male with a hx of essential hypertension, diabetes, hyperlipidemia, erectile dysfunction, neuroendocrine carcinoma of the stomach, GERD, and marijuana use who is being seen 03/24/2022 for the evaluation of substernal chest pain at the request of Dr. Emmit Pomfret.  History of Present Illness:   Mr. Burley presented to the emergency department for evaluation of epigastric and substernal chest pain that started last evening around 1830. He started that he did not eat dinner because he was having discomfort. He describes it has a pressure that progresses to feeling like a knife stabbing. He did endorses vomiting x 1, associated shortness of breath, and diaphoresis that soaked his bed sheets to the point where he had to get out of bed and change clothes. There were no aggravating or alleviating factors noted. He denies any radiation into the back, arm, or jaw. He does endorse the pain worsened with inspiration and he felt as though he was unable to take in a deep breath. These feelings progressed throughout the night and caused him to come in to have further evaluation. He denies any palpitations, abdominal pain, or recent illnesses.   Initial vital signs: Blood pressure 141/77, HR 135, RR 20, temp 98, O2 sats 97%  Pertinent labs: HS troponins 6 and 6, WBC 11.5, glucose 186, direct bili 0.3, indirect bili 1.5, UDS positive for cannabinoid and opiate  Imaging: CTA chest negative for PE, Pneumonia, or pulmonary edema, positive for left lung nodule, CXR reveals no active disease  EKG: Sinus tachycardia, LVH, early repolarization, ST changes noted likely from early  repolarization   Medications: morphine 4mg  IVP, metoprolol succinate 50 mg, NS bolus 500 mls, metoprolol tartrate 5 mg IVP, ondansetron 4 mg IVP, Duoneb, Maalox/Mylanta 30 mls, viscous lidocaine 15 mls, NS 1 L, ketorolac 15 mg IVP  Past Medical History:  Diagnosis Date   Anxiety    Arthritis    knees, hands   Cancer (HCC)    CHF (congestive heart failure) (HCC)    Diabetes mellitus without complication (HCC)    type 2   ED (erectile dysfunction)    Elevated PSA    GERD (gastroesophageal reflux disease)    Heartburn    History of BPH    HLD (hyperlipidemia)    HTN (hypertension)    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   Pneumonia    several times    Past Surgical History:  Procedure Laterality Date   APPENDECTOMY  2005   BIOPSY  03/19/2021   Procedure: BIOPSY;  Surgeon: 03/21/2021., MD;  Location: [facility] ENDOSCOPY;  Service: Gastroenterology;;   CERVICAL SPINE SURGERY  1997   c4-c7   CHOLECYSTECTOMY  2005   COLONOSCOPY WITH PROPOFOL N/A 12/31/2020   Procedure: COLONOSCOPY WITH PROPOFOL;  Surgeon: 03/02/2021, MD;  Location: [facility] ENDOSCOPY;  Service: Endoscopy;  Laterality: N/A;   COLONOSCOPY WITH PROPOFOL N/A 03/19/2021   Procedure: COLONOSCOPY WITH PROPOFOL;  Surgeon: 03/21/2021 Meridee Score., MD;  Location: [facility] ENDOSCOPY;  Service: Gastroenterology;  Laterality: N/A;   ENDOSCOPIC MUCOSAL RESECTION  03/19/2021   Procedure: ENDOSCOPIC MUCOSAL RESECTION;  Surgeon: 03/21/2021 Meridee Score., MD;  Location: [facility] ENDOSCOPY;  Service: Gastroenterology;;  ESOPHAGOGASTRODUODENOSCOPY (EGD) WITH PROPOFOL N/A 12/31/2020   Procedure: ESOPHAGOGASTRODUODENOSCOPY (EGD) WITH PROPOFOL;  Surgeon: Virgel Manifold, MD;  Location: [facility] ENDOSCOPY;  Service: Endoscopy;  Laterality: N/A;   ESOPHAGOGASTRODUODENOSCOPY (EGD) WITH PROPOFOL N/A 03/19/2021   Procedure: ESOPHAGOGASTRODUODENOSCOPY (EGD) WITH PROPOFOL;  Surgeon: Rush Landmark Telford Nab., MD;  Location: [place];   Service: Gastroenterology;  Laterality: N/A;   HEMOSTASIS CLIP PLACEMENT  03/19/2021   Procedure: HEMOSTASIS CLIP PLACEMENT;  Surgeon: Irving Copas., MD;  Location: [place];  Service: Gastroenterology;;   MULTIPLE TOOTH EXTRACTIONS     dentures   POLYPECTOMY  03/19/2021   Procedure: POLYPECTOMY;  Surgeon: Irving Copas., MD;  Location: [place];  Service: Gastroenterology;;   SUBMUCOSAL LIFTING INJECTION  03/19/2021   Procedure: SUBMUCOSAL LIFTING INJECTION;  Surgeon: Irving Copas., MD;  Location: [place];  Service: Gastroenterology;;   TONSILLECTOMY     UPPER ESOPHAGEAL ENDOSCOPIC ULTRASOUND (EUS) N/A 03/19/2021   Procedure: UPPER ESOPHAGEAL ENDOSCOPIC ULTRASOUND (EUS);  Surgeon: Irving Copas., MD;  Location: [place];  Service: Gastroenterology;  Laterality: N/A;     Home Medications:  Prior to Admission medications   Medication Sig Start Date End Date Taking? Authorizing Provider  acetaminophen (TYLENOL) 650 MG CR tablet Take 1,300 mg by mouth 2 (two) times daily.   Yes [provider]  albuterol (VENTOLIN HFA) 108 (90 Base) MCG/ACT inhaler Inhale 2 puffs into the lungs every 6 (six) hours as needed for wheezing or shortness of breath.   Yes [provider]  amLODipine (NORVASC) 10 MG tablet Take 10 mg by mouth daily.   Yes [provider]  aspirin 81 MG EC tablet Take 1 tablet (81 mg total) by mouth daily. 03/22/21  Yes Mansouraty, Telford Nab., MD  cyclobenzaprine (FLEXERIL) 10 MG tablet Take 10 mg by mouth 3 (three) times daily as needed for muscle spasms.   Yes [provider]  diclofenac Sodium (VOLTAREN) 1 % GEL Apply 2 g topically daily as needed (pain).   Yes [provider]  enalapril (VASOTEC) 2.5 MG tablet Take 5 mg by mouth daily.   Yes [provider]  gabapentin (NEURONTIN) 300 MG capsule Take 300 mg by mouth at bedtime.   Yes [provider]  metFORMIN  (GLUCOPHAGE) 1000 MG tablet Take 1,000 mg by mouth daily with breakfast.   Yes [provider]  metoprolol succinate (TOPROL-XL) 50 MG 24 hr tablet Take 50 mg by mouth daily. Take with or immediately following a meal.   Yes [provider]  omeprazole (PRILOSEC OTC) 20 MG tablet Take 2 tablets (40 mg total) by mouth 2 (two) times daily for 28 days. Patient taking differently: Take 20 mg by mouth every morning. 08/21/21 12/02/22 Yes Virgel Manifold, MD  tamsulosin (FLOMAX) 0.4 MG CAPS capsule Take 1 capsule (0.4 mg total) by mouth daily. 03/16/22  Yes McGowan, Larene Beach A, PA-C  HYDROcodone-acetaminophen (NORCO/VICODIN) 5-325 MG tablet Take by mouth. Patient not taking: Reported on 03/24/2022 03/05/22   [provider]  PEG-KCl-NaCl-NaSulf-Na Asc-C (PLENVU) 140 g SOLR Take 1 kit by mouth as directed. Use coupon: BIN: [id number] [facility]: CNRX Group: [id number] ID: [id number] 03/09/22   Mansouraty, Telford Nab., MD    Inpatient Medications: Scheduled Meds:  metoprolol succinate  50 mg Oral Daily   pantoprazole (PROTONIX) IV  40 mg Intravenous Daily   Continuous Infusions:  PRN Meds: HYDROmorphone (DILAUDID) injection  Allergies:   No Known Allergies  Social History:   Social History   Socioeconomic History  Marital status: Married    Spouse name: Not on file   Number of children: 3   Years of education: Not on file   Highest education level: Not on file  Occupational History   Not on file  Tobacco Use   Smoking status: Former    Years: 14.00    Types: Cigarettes    Quit date: 03/05/2004    Years since quitting: 18.0   Smokeless tobacco: Current    Types: Chew  Vaping Use   Vaping Use: Never used  Substance and Sexual Activity   Alcohol use: Not Currently    Alcohol/week: 1.0 standard drink    Types: 1 Cans of beer per week    Comment: occaionally   Drug use: Yes    Frequency: 1.0 times per week    Types: Marijuana   Sexual activity: Yes  Other Topics  Concern   Not on file  Social History Narrative   Not on file   Social Determinants of Health   Financial Resource Strain: Not on file  Food Insecurity: Not on file  Transportation Needs: Not on file  Physical Activity: Not on file  Stress: Not on file  Social Connections: Not on file  Intimate Partner Violence: Not on file    Family History:    Family History  Problem Relation Age of Onset   Kidney cancer Maternal Grandmother    Liver cancer Maternal Grandmother    Stroke Father    Cancer Father        unknown origin   Hypertension Mother    Diabetes Mother    Osteoarthritis Mother    Osteoporosis Mother    Lung cancer Maternal Grandfather    Kidney disease Neg Hx    Prostate cancer Neg Hx    Bladder Cancer Neg Hx    Colon cancer Neg Hx    Esophageal cancer Neg Hx    Inflammatory bowel disease Neg Hx    Pancreatic cancer Neg Hx    Rectal cancer Neg Hx    Stomach cancer Neg Hx      ROS:  Please see the history of present illness.  Review of Systems  Constitutional:  Positive for diaphoresis and malaise/fatigue.  HENT:  Positive for sore throat.   Eyes: Negative.   Respiratory:  Positive for shortness of breath.   Cardiovascular:  Positive for chest pain.  Gastrointestinal:  Positive for abdominal pain, heartburn, nausea and vomiting.  Genitourinary: Negative.   Musculoskeletal: Negative.   Skin: Negative.   Neurological:  Positive for weakness.  Endo/Heme/Allergies: Negative.   Psychiatric/Behavioral: Negative.     All other ROS reviewed and negative.     Physical Exam/Data:   Vitals:   03/24/22 1130 03/24/22 1200 03/24/22 1230 03/24/22 1300  BP: 127/77 124/71 126/70 112/70  Pulse: (!) 103 (!) 103 (!) 103 97  Resp: $Remo'16 16 17 16  'ypahN$ Temp:      SpO2: 93% 92% 93% 95%  Weight:      Height:       No intake or output data in the 24 hours ending 03/24/22 1413    03/24/2022    8:31 AM 03/16/2022   11:24 AM 02/10/2022   10:50 AM  Last 3 Weights  Weight  (lbs) 178 lb 179 lb 1.6 oz 181 lb  Weight (kg) 80.74 kg 81.239 kg 82.101 kg     Body mass index is 25.54 kg/m.  General:  Well nourished, well developed, in no acute distress sitting upright on  emergency room stretcher HEENT: normal Neck: no JVD appreciated  Vascular: No carotid bruits; Distal pulses 2+ bilaterally Cardiac:  normal S1, S2; RRR; no murmur, reproducible chest pain to placing stethoscope on patients chest Lungs:  clear to auscultation bilaterally, no wheezing, rhonchi or rales, respirations are unlabored on room air Abd: soft, mild tenderness to palpitation, no hepatomegaly, bowel sounds + in four quadrants Ext: no edema Musculoskeletal:  No deformities, BUE and BLE strength normal and equal Skin: warm and dry  Neuro:  CNs 2-12 intact, no focal abnormalities noted Psych:  Normal affect   EKG:  The EKG was personally reviewed and demonstrates:  sinus tachycardia rate of 135, LVH, early repolarization  Telemetry:  Telemetry was personally reviewed and demonstrates:  sinus rhy with atrifact  Relevant CV Studies: Echocardiogram results pending  Laboratory Data:  High Sensitivity Troponin:   Recent Labs  Lab 03/24/22 0833 03/24/22 1053  TROPONINIHS 6 6     Chemistry Recent Labs  Lab 03/24/22 0833  NA 135  K 3.5  CL 97*  CO2 25  GLUCOSE 186*  BUN 6*  CREATININE 1.07  CALCIUM 9.4  GFRNONAA >60  ANIONGAP 13    Recent Labs  Lab 03/24/22 0833  PROT 7.9  ALBUMIN 3.9  AST 24  ALT 23  ALKPHOS 83  BILITOT 1.8*   Lipids No results for input(s): CHOL, TRIG, HDL, LABVLDL, LDLCALC, CHOLHDL in the last 168 hours.  Hematology Recent Labs  Lab 03/24/22 0833  WBC 11.5*  RBC 5.48  HGB 15.5  HCT 46.0  MCV 83.9  MCH 28.3  MCHC 33.7  RDW 12.7  PLT 220   Thyroid No results for input(s): TSH, FREET4 in the last 168 hours.  BNPNo results for input(s): BNP, PROBNP in the last 168 hours.  DDimer No results for input(s): DDIMER in the last 168  hours.   Radiology/Studies:  CT Angio Chest PE W and/or Wo Contrast  Result Date: 03/24/2022 CLINICAL DATA:  Substernal chest pain with shortness of breath. Nausea and diaphoresis since last night. EXAM: CT ANGIOGRAPHY CHEST WITH CONTRAST TECHNIQUE: Multidetector CT imaging of the chest was performed using the standard protocol during bolus administration of intravenous contrast. Multiplanar CT image reconstructions and MIPs were obtained to evaluate the vascular anatomy. RADIATION DOSE REDUCTION: This exam was performed according to the departmental dose-optimization program which includes automated exposure control, adjustment of the mA and/or kV according to patient size and/or use of iterative reconstruction technique. CONTRAST:  52mL OMNIPAQUE IOHEXOL 350 MG/ML SOLN COMPARISON:  Radiographs dated Mar 24, 2022; PET-CT dated May 08, 2021 FINDINGS: Cardiovascular: Satisfactory opacification of the pulmonary arteries to the segmental level. No evidence of pulmonary embolism. Normal heart size. No pericardial effusion. Coronary artery atherosclerotic calcifications. Aorta is normal in caliber with atherosclerotic calcification of the aortic arch. Mediastinum/Nodes: No enlarged mediastinal, hilar, or axillary lymph nodes. Thyroid gland, trachea, and esophagus demonstrate no significant findings. Lungs/Pleura: 1.0 x 1.6 x 1.2 cm ovoid opacity in the anterior aspect of the left upper lobe. This nodule was present on prior PET-CT examination of May 08, 2021 and has not significantly changed since prior examination. Lungs otherwise clear without evidence of pneumonia or pulmonary edema. Upper Abdomen: Cholecystectomy changes. Musculoskeletal: No chest wall abnormality. No acute or significant osseous findings. Review of the MIP images confirms the above findings. IMPRESSION: 1.  No evidence of pulmonary embolism. 2.  No evidence of pneumonia or pulmonary edema. 3. 1.0 x 1.6 x 1.2 cm ovoid opacity in  the anterior  aspect of the left upper lobe. This nodule was present on prior PET-CT examination of May 08, 2021 and has not significantly changed since. Recommend follow-up CT in 3 months or PET/CT or Biopsy for further evaluation. This recommendation follows the consensus statement: "Guidelines for Management of Small Pulmonary Nodules Detected on CT Scans: A Statement from the [place]" as published in Radiology 2005; 237:395-400. Aortic Atherosclerosis (ICD10-I70.0). Electronically Signed   By: Keane Police D.O.   On: 03/24/2022 10:09   DG Chest Port 1 View  Result Date: 03/24/2022 CLINICAL DATA:  Sub sternal chest pain with shortness of breath. EXAM: PORTABLE CHEST 1 VIEW COMPARISON:  03/14/2018 FINDINGS: Heart size and mediastinal contours are unremarkable. There is no pleural effusion or edema identified. No airspace opacities identified. The visualized osseous structures are unremarkable. IMPRESSION: No active disease. Electronically Signed   By: Kerby Moors M.D.   On: 03/24/2022 08:46     Assessment and Plan:   Atypical chest pain  - HS troponins negative times 2 - trended results 6 and 6 - EKG has some ST/T abnormalities that are new from previous studies - Recommend Lexiscan Myoview tomorrow  - Continue cardiac monitor - Continue asa and metoprolol    2. Shortness of breath - CTA chest negative for PE - Currently on room air  - Respiratory panel pending - Echocardiogram ordered and pending   3. Essential hypertension - Continue home medication regimen - Echocardiogram ordered and pending  4. Hyperlipidemia - Lipid panel - Patient states he is statin intolerant  5. Diabetes - Metformin currently on hold    Risk Assessment/Risk Scores:     HEAR Score (for undifferentiated chest pain):             For questions or updates, please contact [place] Please consult [email or web address] for contact info under    Signed, Gearl Baratta, NP  03/24/2022 2:13 PM

## 2022-03-24 NOTE — H&P (Signed)
[facility] [place] [facility] Admission History and Physical Alexis Hugelmeyer, D.O.  ---------------------------------------------------------------------------------------------------------------------   PATIENT NAME: Kevin Esparza MR#: [id number] DATE OF BIRTH: 03/16/1961 DATE OF ADMISSION: 03/24/2022 PRIMARY CARE PHYSICIAN: Markley, Linda, PA  REQUESTING/REFERRING PHYSICIAN: ED Dr. Robinson  CHIEF COMPLAINT: Chief Complaint  Patient presents with   Chest Pain    HISTORY OF PRESENT ILLNESS: Kevin Esparza is a 61 y.o. male with a known history of DM, HTN, HLD, CHF, GERD presents to the emergency department for evaluation of chest pain.  Patient was in a usual state of health until 20 hours ago when he developed substernal and epigastric chest pain while at rest with associated diaphoresis and vomiting x1.  Pain is worse with inspiration and has persisted through the night, not improved here by Maalox, morphine, nitro or Toradol.  This feels different that his typical GERD symptoms which are more laryngeal.  Of note he reports low grade fevers for the past two days and has been smoking synthetic marijuana, last smoked the night before symptoms started.  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    Otherwise there has been no change in status. Patient has been taking medication as prescribed and there has been no recent change in medication or diet.  There has been no recent illness, travel or sick contacts.    Patient denies weakness, dizziness, shortness of breath, cough, constipation,diarrhea, abdominal pain, dysuria/frequency, changes in mental status.   EMS/ED COURSE:   Patient received toradol, Toprol,  morphine, Zofran, Duoneb, NS, Maalox..  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   Pneumonia    several times      PAST SURGICAL HISTORY: Past Surgical History:  Procedure Laterality Date   APPENDECTOMY  2005   BIOPSY  03/19/2021   Procedure: BIOPSY;  Surgeon: Mansouraty, Gabriel Jr., MD;  Location: [facility];  Service: Gastroenterology;;   CERVICAL SPINE SURGERY  1997   c4-c7   CHOLECYSTECTOMY  2005   COLONOSCOPY WITH PROPOFOL N/A 12/31/2020   Procedure: COLONOSCOPY WITH PROPOFOL;  Surgeon: Tahiliani, Varnita B, MD;  Location: [facility];  Service: Endoscopy;  Laterality: N/A;   COLONOSCOPY WITH PROPOFOL N/A 03/19/2021   Procedure: COLONOSCOPY WITH PROPOFOL;  Surgeon: Mansouraty, Gabriel Jr., MD;  Location: [facility];  Service: Gastroenterology;  Laterality: N/A;   ENDOSCOPIC MUCOSAL RESECTION  03/19/2021   Procedure: ENDOSCOPIC MUCOSAL RESECTION;  Surgeon: Mansouraty, Gabriel Jr., MD;  Location: [facility];  Service: Gastroenterology;;   ESOPHAGOGASTRODUODENOSCOPY (EGD) WITH PROPOFOL N/A 12/31/2020   Procedure: ESOPHAGOGASTRODUODENOSCOPY (EGD) WITH PROPOFOL;  Surgeon: Tahiliani, Varnita B, MD;  Location: [facility];  Service: Endoscopy;  Laterality: N/A;   ESOPHAGOGASTRODUODENOSCOPY (EGD) WITH PROPOFOL N/A 03/19/2021   Procedure: ESOPHAGOGASTRODUODENOSCOPY (EGD) WITH PROPOFOL;  Surgeon: Mansouraty, Gabriel Jr., MD;  Location: [facility];  Service: Gastroenterology;  Laterality: N/A;   HEMOSTASIS CLIP PLACEMENT  03/19/2021   Procedure: HEMOSTASIS CLIP PLACEMENT;  Surgeon: Mansouraty, Gabriel Jr., MD;  Location:   [facility];  Service: Gastroenterology;;   MULTIPLE TOOTH EXTRACTIONS     dentures   POLYPECTOMY  03/19/2021   Procedure: POLYPECTOMY;  Surgeon: Mansouraty, Gabriel Jr., MD;  Location: [facility];  Service: Gastroenterology;;   SUBMUCOSAL LIFTING INJECTION  03/19/2021   Procedure: SUBMUCOSAL LIFTING INJECTION;  Surgeon: Mansouraty, Gabriel Jr., MD;  Location: [facility];  Service: Gastroenterology;;   TONSILLECTOMY     UPPER ESOPHAGEAL ENDOSCOPIC ULTRASOUND (EUS) N/A 03/19/2021   Procedure: UPPER ESOPHAGEAL ENDOSCOPIC ULTRASOUND (EUS);  Surgeon: Mansouraty, Gabriel Jr., MD;  Location: [facility];  Service: Gastroenterology;  Laterality: N/A;      SOCIAL HISTORY: Social History   Tobacco Use   Smoking status: Former    Years: 14.00    Types: Cigarettes    Quit date: 03/05/2004    Years since quitting: 18.0   Smokeless tobacco: Current    Types: Chew  Substance Use Topics   Alcohol use: Not Currently    Alcohol/week: 1.0 standard drink    Types: 1 Cans of beer per week    Comment: occaionally      FAMILY HISTORY: Family History  Problem Relation Age of Onset   Kidney cancer Maternal Grandmother    Liver cancer Maternal Grandmother    Stroke Father    Cancer Father        unknown origin   Hypertension Mother    Diabetes Mother    Osteoarthritis Mother    Osteoporosis Mother    Lung cancer Maternal Grandfather    Kidney disease Neg Hx    Prostate cancer Neg Hx    Bladder Cancer Neg Hx    Colon cancer Neg Hx    Esophageal cancer Neg Hx    Inflammatory bowel disease Neg Hx    Pancreatic cancer Neg Hx    Rectal cancer Neg Hx    Stomach cancer Neg Hx      MEDICATIONS AT HOME: Prior to Admission medications   Medication Sig Start Date End Date Taking? Authorizing Provider  acetaminophen (TYLENOL) 650 MG CR tablet Take 1,300 mg by mouth 2 (two) times daily.    [provider]  albuterol (VENTOLIN HFA) 108 (90 Base) MCG/ACT inhaler Inhale 2 puffs into the lungs every 6 (six) hours as needed for wheezing or shortness of breath.    [provider]  amLODipine (NORVASC) 10 MG tablet Take 10 mg by mouth daily.    [provider]   aspirin 81 MG EC tablet Take 1 tablet (81 mg total) by mouth daily. 03/22/21   Mansouraty, Gabriel Jr., MD  cyclobenzaprine (FLEXERIL) 10 MG tablet Take 10 mg by mouth 3 (three) times daily as needed for muscle spasms.    [provider]  diclofenac Sodium (VOLTAREN) 1 % GEL Apply 2 g topically daily as needed (pain).    [provider]  enalapril (VASOTEC) 2.5 MG tablet Take 5 mg by mouth daily.    [provider]  gabapentin (NEURONTIN) 300 MG capsule Take 300 mg by mouth 3 (three) times daily.    [provider]  HYDROcodone-acetaminophen (NORCO/VICODIN) 5-325 MG tablet Take by mouth. 03/05/22   [provider]  metFORMIN (GLUCOPHAGE) 1000 MG tablet Take 1,000 mg by mouth daily with breakfast.    [provider]  metoprolol succinate (TOPROL-XL) 50 MG 24 hr tablet Take 50 mg by mouth daily. Take with or immediately following a meal.    [provider]  omeprazole (PRILOSEC OTC) 20 MG tablet Take 2 tablets (  40 mg total) by mouth 2 (two) times daily for 28 days. Patient taking differently: Take 20 mg by mouth every morning. 08/21/21 12/02/22  Virgel Manifold, MD  PEG-KCl-NaCl-NaSulf-Na Asc-C (PLENVU) 140 g SOLR Take 1 kit by mouth as directed. Use coupon: BIN: [id number] [facility]: [employer] Group: [id number] ID: [id number] 03/09/22   Mansouraty, Telford Nab., MD  tamsulosin (FLOMAX) 0.4 MG CAPS capsule Take 1 capsule (0.4 mg total) by mouth daily. 03/16/22   McGowan, Larene Beach A, PA-C      DRUG ALLERGIES: No Known Allergies   REVIEW OF SYSTEMS: CONSTITUTIONAL: Positive fevers, No fatigue, weakness, chills, weight gain/loss, headache EYES: No blurry or double vision. ENT: No tinnitus, postnasal drip, redness or soreness of the oropharynx. RESPIRATORY: No dyspnea, cough, wheeze, hemoptysis. CARDIOVASCULAR: Positive chest pain, negative orthopnea, palpitations, syncope. GASTROINTESTINAL: Positive vomiting x 1, No nausea, constipation, diarrhea,  abdominal pain. No hematemesis, melena or hematochezia. GENITOURINARY: No dysuria, frequency, hematuria. ENDOCRINE: No polyuria or nocturia. No heat or cold intolerance. HEMATOLOGY: No anemia, bruising, bleeding. INTEGUMENTARY: No rashes, ulcers, lesions. MUSCULOSKELETAL: No pain, arthritis, swelling, gout. NEUROLOGIC: No numbness, tingling, weakness or ataxia. No seizure-type activity. PSYCHIATRIC: No anxiety, depression, insomnia.  PHYSICAL EXAMINATION: VITAL SIGNS: Blood pressure 126/70, pulse (!) 103, temperature 98 F (36.7 C), resp. rate 17, height 5' 10" (1.778 m), weight 80.7 kg, SpO2 93 %.  GENERAL: 61 y.o.-year-old white male patient, well-developed, well-nourished lying in the bed in no acute distress.  Pleasant and cooperative.  Intermittently distressed due to pain with inspiration. HEENT: Head atraumatic, normocephalic. Pupils equal. Oropharynx is clear. Mucus membranes moist. NECK: Supple, full range of motion. CHEST: Normal breath sounds bilaterally. No wheezing, rales, rhonchi or crackles. No use of accessory muscles of respiration.  No reproducible chest wall tenderness.  CARDIOVASCULAR: S1, S2 normal. No murmurs, rubs, or gallops appreciated. Cap refill <2 seconds. ABDOMEN: Soft, nontender, nondistended. No rebound, guarding, rigidity. Normoactive bowel sounds present in all four quadrants. EXTREMITIES: Full range of motion. No pedal edema, cyanosis, or clubbing. NEUROLOGIC: Cranial nerves II through XII are grossly intact with no focal sensorimotor deficit.  PSYCHIATRIC: The patient is alert and oriented x 3. Normal affect, mood, thought content. SKIN: Warm, dry, and intact without obvious rash, lesion, or ulcer.  LABORATORY PANEL:  CBC Recent Labs  Lab 03/24/22 0833  WBC 11.5*  HGB 15.5  HCT 46.0  PLT 220   ----------------------------------------------------------------------------------------------------------------- Chemistries Recent Labs  Lab  03/24/22 0833  NA 135  K 3.5  CL 97*  CO2 25  GLUCOSE 186*  BUN 6*  CREATININE 1.07  CALCIUM 9.4  AST 24  ALT 23  ALKPHOS 83  BILITOT 1.8*   ------------------------------------------------------------------------------------------------------------------ Cardiac Enzymes No results for input(s): TROPONINI in the last 168 hours. ------------------------------------------------------------------------------------------------------------------  RADIOLOGY: CT Angio Chest PE W and/or Wo Contrast  Result Date: 03/24/2022 CLINICAL DATA:  Substernal chest pain with shortness of breath. Nausea and diaphoresis since last night. EXAM: CT ANGIOGRAPHY CHEST WITH CONTRAST TECHNIQUE: Multidetector CT imaging of the chest was performed using the standard protocol during bolus administration of intravenous contrast. Multiplanar CT image reconstructions and MIPs were obtained to evaluate the vascular anatomy. RADIATION DOSE REDUCTION: This exam was performed according to the departmental dose-optimization program which includes automated exposure control, adjustment of the mA and/or kV according to patient size and/or use of iterative reconstruction technique. CONTRAST:  69m OMNIPAQUE IOHEXOL 350 MG/ML SOLN COMPARISON:  Radiographs dated Mar 24, 2022; PET-CT dated May 08, 2021 FINDINGS: Cardiovascular: Satisfactory opacification of the pulmonary  arteries to the segmental level. No evidence of pulmonary embolism. Normal heart size. No pericardial effusion. Coronary artery atherosclerotic calcifications. Aorta is normal in caliber with atherosclerotic calcification of the aortic arch. Mediastinum/Nodes: No enlarged mediastinal, hilar, or axillary lymph nodes. Thyroid gland, trachea, and esophagus demonstrate no significant findings. Lungs/Pleura: 1.0 x 1.6 x 1.2 cm ovoid opacity in the anterior aspect of the left upper lobe. This nodule was present on prior PET-CT examination of May 08, 2021 and has not  significantly changed since prior examination. Lungs otherwise clear without evidence of pneumonia or pulmonary edema. Upper Abdomen: Cholecystectomy changes. Musculoskeletal: No chest wall abnormality. No acute or significant osseous findings. Review of the MIP images confirms the above findings. IMPRESSION: 1.  No evidence of pulmonary embolism. 2.  No evidence of pneumonia or pulmonary edema. 3. 1.0 x 1.6 x 1.2 cm ovoid opacity in the anterior aspect of the left upper lobe. This nodule was present on prior PET-CT examination of May 08, 2021 and has not significantly changed since. Recommend follow-up CT in 3 months or PET/CT or Biopsy for further evaluation. This recommendation follows the consensus statement: "Guidelines for Management of Small Pulmonary Nodules Detected on CT Scans: A Statement from the [place]" as published in Radiology 2005; 237:395-400. Aortic Atherosclerosis (ICD10-I70.0). Electronically Signed   By: Keane Police D.O.   On: 03/24/2022 10:09   DG Chest Port 1 View  Result Date: 03/24/2022 CLINICAL DATA:  Sub sternal chest pain with shortness of breath. EXAM: PORTABLE CHEST 1 VIEW COMPARISON:  03/14/2018 FINDINGS: Heart size and mediastinal contours are unremarkable. There is no pleural effusion or edema identified. No airspace opacities identified. The visualized osseous structures are unremarkable. IMPRESSION: No active disease. Electronically Signed   By: Kerby Moors M.D.   On: 03/24/2022 08:46    EKG: Sinus tach @ bpm with normal axis and nonspecific ST-T wave changes.   IMPRESSION AND PLAN:  This is a 61 y.o. male with a history of DM, HTN, HLD, CHF, GERD, BPH now being admitted with:  1. Chest pain, rule out ACS - Admit to observation with telemetry monitoring. - Trend troponins, check lipids and TSH. - Beta blocker, aspirin and statin ordered.   - Morphine and nitro discontinued per patient request (ineffective) - Check echo - Check COVID, flu  - Trial  of IV Protonix - Cardiology consult requested.   2. H/o Diabetes - Accuchecks q4h with RISS coverage - Hold metformin - NPO pending cardiology consult  3. History of HTN - Continue metoprolol, amlodipine, enalapril  4. History of HLD - Check lipids - Ordered atorvastatin  5. History of CHF - Continue metoprolol  6. History of GERD - Trial of IV Protonix  7. History of BPH - Continue Flomax  8.  History of LUL pulm nodule, unchanged - recommend outpatient follow up in 3-6 months per radiology recommendations  9. History of marijuana use  - Advised cessation   Admission status: Observation, telemetry Diet/Nutrition: NPO then heart healthy, carb controlled Fluids: HL DVT Px: Heparin, SCDs and early ambulation Code Status: Full Disposition Plan: To home in <24 hours  All the records are reviewed and case discussed with ED provider. Management plans discussed with the patient and family at bedside who express understanding and agree with plan of care.   TOTAL TIME TAKING CARE OF THIS PATIENT: 60 minutes.   [employer] D.O. on 03/24/2022 at 12:49 PM CC: Primary care physician; Ranae Plumber, PA     Note:  This dictation was prepared with Dragon dictation along with smaller phrase technology. Any transcriptional errors that result from this process are unintentional.

## 2022-03-24 NOTE — ED Provider Notes (Signed)
[facility] Provider Note    Event Date/Time   First MD Initiated Contact with Patient 03/24/22 [id number]     (approximate)   History   Chest Pain   HPI  Kevin Esparza is a 61 y.o. male history of smoking hypertension presents to the ER for evaluation of midsternal nonradiating chest pain consistent with some diaphoresis since last night.  Is never had pain like this before.  Does have reflux but this feels different.  Denies any abdominal pain.  Does have shortness of breath and it hurts when he takes a deep breath.  No specific change in discomfort with position.     Physical Exam   Triage Vital Signs: ED Triage Vitals  Enc Vitals Group     BP 03/24/22 0826 (!) 141/77     Pulse Rate 03/24/22 0826 (!) 135     Resp 03/24/22 0826 20     Temp 03/24/22 0826 98 F (36.7 C)     Temp src --      SpO2 03/24/22 0826 97 %     Weight 03/24/22 0831 178 lb (80.7 kg)     Height 03/24/22 0831 '5\' 10"'$  (1.778 m)     Head Circumference --      Peak Flow --      Pain Score 03/24/22 0831 10     Pain Loc --      Pain Edu? --      Excl. in [place]? --     Most recent vital signs: Vitals:   03/24/22 1130 03/24/22 1200  BP: 127/77 124/71  Pulse: (!) 103 (!) 103  Resp: 16 16  Temp:    SpO2: 93% 92%     Constitutional: Alert  Eyes: Conjunctivae are normal.  Head: Atraumatic. Nose: No congestion/rhinnorhea. Mouth/Throat: Mucous membranes are moist.   Neck: Painless ROM.  Cardiovascular:   Good peripheral circulation.  Tachycardic with no m/gr Respiratory: Normal respiratory effort.  No retractions.  Gastrointestinal: Soft and nontender.  Musculoskeletal:  no deformity Neurologic:  MAE spontaneously. No gross focal neurologic deficits are appreciated.  Skin:  Skin is warm, dry and intact. No rash noted. Psychiatric: Mood and affect are normal. Speech and behavior are normal.    ED Results / Procedures / Treatments   Labs (all labs ordered are listed, but  only abnormal results are displayed) Labs Reviewed  BASIC METABOLIC PANEL - Abnormal; Notable for the following components:      Result Value   Chloride 97 (*)    Glucose, Bld 186 (*)    BUN 6 (*)    All other components within normal limits  CBC - Abnormal; Notable for the following components:   WBC 11.5 (*)    All other components within normal limits  HEPATIC FUNCTION PANEL - Abnormal; Notable for the following components:   Total Bilirubin 1.8 (*)    Bilirubin, Direct 0.3 (*)    Indirect Bilirubin 1.5 (*)    All other components within normal limits  LIPASE, BLOOD  TROPONIN I (HIGH SENSITIVITY)  TROPONIN I (HIGH SENSITIVITY)     EKG  ED ECG REPORT I, Merlyn Lot, the attending physician, personally viewed and interpreted this ECG.   Date: 03/24/2022  EKG Time: 8:26  Rate: 135  Rhythm: sinus  Axis: normal  Intervals: normal qt  ST&T Change: nonspecific st abn likely rate dependent, no reciprocal depressions    RADIOLOGY Please see ED Course for my review and interpretation.  I personally reviewed  all radiographic images ordered to evaluate for the above acute complaints and reviewed radiology reports and findings.  These findings were personally discussed with the patient.  Please see medical record for radiology report.    PROCEDURES:  Critical Care performed: No  Procedures   MEDICATIONS ORDERED IN ED: Medications  morphine (PF) 4 MG/ML injection 4 mg (4 mg Intravenous Given 03/24/22 0924)  metoprolol succinate (TOPROL-XL) 24 hr tablet 50 mg (50 mg Oral Given 03/24/22 1221)  sodium chloride 0.9 % bolus 500 mL (0 mLs Intravenous Stopped 03/24/22 1030)  metoprolol tartrate (LOPRESSOR) injection 5 mg (5 mg Intravenous Given 03/24/22 1124)  ondansetron (ZOFRAN) injection 4 mg (4 mg Intravenous Given 03/24/22 0920)  iohexol (OMNIPAQUE) 350 MG/ML injection 75 mL (75 mLs Intravenous Contrast Given 03/24/22 0945)  ipratropium-albuterol (DUONEB) 0.5-2.5 (3)  MG/3ML nebulizer solution 3 mL (3 mLs Nebulization Given 03/24/22 1026)  alum & mag hydroxide-simeth (MAALOX/MYLANTA) 200-200-20 MG/5ML suspension 30 mL (30 mLs Oral Given 03/24/22 1051)    And  lidocaine (XYLOCAINE) 2 % viscous mouth solution 15 mL (15 mLs Oral Given 03/24/22 1051)  sodium chloride 0.9 % bolus 1,000 mL (1,000 mLs Intravenous New Bag/Given 03/24/22 1055)  ketorolac (TORADOL) 30 MG/ML injection 15 mg (15 mg Intravenous Given 03/24/22 1221)     IMPRESSION / MDM / ASSESSMENT AND PLAN / ED COURSE  I reviewed the triage vital signs and the nursing notes.                              Differential diagnosis includes, but is not limited to, ACS, pericarditis, e, pe, dissection, pna, bronchitis, costochondritis  Patient presented to the ER for evaluation of symptoms as described above.  This presenting complaint could reflect a potentially life-threatening illness therefore the patient will be placed on continuous pulse oximetry and telemetry for monitoring.  Laboratory evaluation will be sent to evaluate for the above complaints.     Clinical Course as of 03/24/22 1229  Wed Mar 24, 2022  1004 CT angio chest on my interpretation does not show any evidence of PE. [PR]  1207 Patient with persistent chest pain mildly tachycardic repeat troponin stable nonischemic but does have EKG changes compared to previous therefore given his persistent pain do feel observation the hospital for chest pain rule out indicated.  Will discuss case with hospitalist in consultation for admission. [PR]    Clinical Course User Index [PR] Merlyn Lot, MD    Patient's presentation is most consistent with acute presentation with potential threat to life or bodily function.   FINAL CLINICAL IMPRESSION(S) / ED DIAGNOSES   Final diagnoses:  Atypical chest pain     Rx / DC Orders   ED Discharge Orders     None        Note:  This document was prepared using Dragon voice recognition software  and may include unintentional dictation errors.    Merlyn Lot, MD 03/24/22 1229

## 2022-03-24 NOTE — ED Triage Notes (Signed)
Pt c/o substernal chest pain with SOB, Nausea and diaphoresis since last night.

## 2022-03-25 ENCOUNTER — Observation Stay (HOSPITAL_BASED_OUTPATIENT_CLINIC_OR_DEPARTMENT_OTHER): Payer: Medicare Other

## 2022-03-25 ENCOUNTER — Observation Stay (HOSPITAL_BASED_OUTPATIENT_CLINIC_OR_DEPARTMENT_OTHER)
Admit: 2022-03-25 | Discharge: 2022-03-25 | Disposition: A | Discharge status: Home / Self Care | Payer: Medicare Other | Attending: Physician Assistant | Admitting: Physician Assistant

## 2022-03-25 ENCOUNTER — Observation Stay: Admit: 2022-03-25 | Payer: Medicare Other

## 2022-03-25 ENCOUNTER — Encounter: Payer: Self-pay | Admitting: Family Medicine

## 2022-03-25 DIAGNOSIS — R0789 Other chest pain: Secondary | ICD-10-CM

## 2022-03-25 DIAGNOSIS — I300 Acute nonspecific idiopathic pericarditis: Secondary | ICD-10-CM | POA: Diagnosis not present

## 2022-03-25 DIAGNOSIS — R079 Chest pain, unspecified: Secondary | ICD-10-CM | POA: Diagnosis not present

## 2022-03-25 LAB — CBC
HCT: 37 % — ABNORMAL LOW (ref 39.0–52.0)
Hemoglobin: 12.2 g/dL — ABNORMAL LOW (ref 13.0–17.0)
MCH: 28.4 pg (ref 26.0–34.0)
MCHC: 33 g/dL (ref 30.0–36.0)
MCV: 86 fL (ref 80.0–100.0)
Platelets: 157 10*3/uL (ref 150–400)
RBC: 4.3 MIL/uL (ref 4.22–5.81)
RDW: 13.2 % (ref 11.5–15.5)
WBC: 7.8 10*3/uL (ref 4.0–10.5)
nRBC: 0 % (ref 0.0–0.2)

## 2022-03-25 LAB — ECHOCARDIOGRAM COMPLETE
AR max vel: 2.73 cm2
AV Area VTI: 3.06 cm2
AV Area mean vel: 2.82 cm2
AV Mean grad: 2 mmHg
AV Peak grad: 3.8 mmHg
Ao pk vel: 0.98 m/s
Area-P 1/2: 5.23 cm2
Height: 70 in
MV VTI: 4.02 cm2
S' Lateral: 3.4 cm
Weight: 2880 oz

## 2022-03-25 LAB — NM MYOCAR MULTI W/SPECT W/WALL MOTION / EF
LV dias vol: 83 mL (ref 62–150)
LV sys vol: 33 mL
Nuc Stress EF: 60 %
Peak HR: 115 {beats}/min
Percent HR: 72 %
Rest HR: 89 {beats}/min
Rest Nuclear Isotope Dose: 10 mCi
SDS: 1
SRS: 2
SSS: 1
Stress Nuclear Isotope Dose: 29.8 mCi
TID: 1.18

## 2022-03-25 LAB — GLUCOSE, CAPILLARY
Glucose-Capillary: 115 mg/dL — ABNORMAL HIGH (ref 70–99)
Glucose-Capillary: 131 mg/dL — ABNORMAL HIGH (ref 70–99)
Glucose-Capillary: 143 mg/dL — ABNORMAL HIGH (ref 70–99)

## 2022-03-25 LAB — HEMOGLOBIN A1C
Hgb A1c MFr Bld: 6 % — ABNORMAL HIGH (ref 4.8–5.6)
Mean Plasma Glucose: 125.5 mg/dL

## 2022-03-25 LAB — TROPONIN I (HIGH SENSITIVITY)
Troponin I (High Sensitivity): 8 ng/L (ref <18)
Troponin I (High Sensitivity): 7 ng/L (ref <18)

## 2022-03-25 LAB — C-REACTIVE PROTEIN: CRP: 18.8 mg/dL — ABNORMAL HIGH (ref <1.0)

## 2022-03-25 LAB — LIPID PANEL
Cholesterol: 133 mg/dL (ref 0–200)
HDL: 38 mg/dL — ABNORMAL LOW (ref >40)
LDL Cholesterol: 76 mg/dL (ref 0–99)
Total CHOL/HDL Ratio: 3.5 RATIO
Triglycerides: 93 mg/dL (ref <150)
VLDL: 19 mg/dL (ref 0–40)

## 2022-03-25 LAB — HIV ANTIBODY (ROUTINE TESTING W REFLEX): HIV Screen 4th Generation wRfx: NONREACTIVE

## 2022-03-25 MED ORDER — TECHNETIUM TC 99M TETROFOSMIN IV KIT
29.8100 | PACK | Freq: Once | INTRAVENOUS | Status: AC | PRN
  Administered 2022-03-25: 29.81 via INTRAVENOUS

## 2022-03-25 MED ORDER — HYDROMORPHONE HCL 1 MG/ML IJ SOLN
0.5000 mg | INTRAMUSCULAR | Status: AC
  Administered 2022-03-25: 0.5 mg via INTRAVENOUS
  Filled 2022-03-25: qty 1

## 2022-03-25 MED ORDER — IBUPROFEN 400 MG PO TABS
800.0000 mg | ORAL_TABLET | Freq: Three times a day (TID) | ORAL | Status: DC
  Administered 2022-03-25: 800 mg via ORAL
  Filled 2022-03-25: qty 2

## 2022-03-25 MED ORDER — HEPARIN BOLUS VIA INFUSION
4000.0000 [IU] | Freq: Once | INTRAVENOUS | Status: AC
  Administered 2022-03-25: 4000 [IU] via INTRAVENOUS
  Filled 2022-03-25: qty 4000

## 2022-03-25 MED ORDER — COLCHICINE 0.6 MG PO TABS: 0.6000 mg | ORAL_TABLET | Freq: Two times a day (BID) | ORAL | 0 refills | Status: DC

## 2022-03-25 MED ORDER — REGADENOSON 0.4 MG/5ML IV SOLN
0.4000 mg | Freq: Once | INTRAVENOUS | Status: AC
  Administered 2022-03-25: 0.4 mg via INTRAVENOUS

## 2022-03-25 MED ORDER — IBUPROFEN 800 MG PO TABS: 800.0000 mg | ORAL_TABLET | Freq: Three times a day (TID) | ORAL | 0 refills | Status: DC

## 2022-03-25 MED ORDER — TECHNETIUM TC 99M TETROFOSMIN IV KIT
10.0300 | PACK | Freq: Once | INTRAVENOUS | Status: AC | PRN
  Administered 2022-03-25: 10.03 via INTRAVENOUS

## 2022-03-25 MED ORDER — ASPIRIN 325 MG PO TABS
325.0000 mg | ORAL_TABLET | ORAL | Status: AC
  Administered 2022-03-25: 325 mg via ORAL
  Filled 2022-03-25: qty 1

## 2022-03-25 MED ORDER — HEPARIN (PORCINE) 25000 UT/250ML-% IV SOLN
1000.0000 [IU]/h | INTRAVENOUS | Status: DC
  Administered 2022-03-25: 1000 [IU]/h via INTRAVENOUS
  Filled 2022-03-25: qty 250

## 2022-03-25 MED ORDER — COLCHICINE 0.6 MG PO TABS
0.6000 mg | ORAL_TABLET | Freq: Two times a day (BID) | ORAL | Status: DC
  Administered 2022-03-25: 0.6 mg via ORAL
  Filled 2022-03-25: qty 1

## 2022-03-25 NOTE — Plan of Care (Signed)

## 2022-03-25 NOTE — Discharge Summary (Addendum)
Pratyush Ammon HER:[id number] DOB: May 10, 1961 DOA: 03/24/2022  PCP: Ranae Plumber, PA  Admit date: 03/24/2022 Discharge date: 03/25/22  Admitted From: home Disposition:  home  Recommendations for Outpatient Follow-up:  Follow up with PCP in 1 week Please obtain BMP/CBC in one week Please follow up with cardiology next week     Discharge Condition:Stable CODE STATUS:full  Diet recommendation: Heart Healthy Brief/Interim Summary: Per HPI: Kevin Esparza is a 61 y.o. male with a known history of DM, HTN, HLD, CHF, GERD presents to the emergency department for evaluation of chest pain.  Patient was in a usual state of health until 20 hours ago when he developed substernal and epigastric chest pain while at rest with associated diaphoresis and vomiting x1.  Pain is worse with inspiration and has persisted through the night, not improved here by Maalox, morphine, nitro or Toradol.  Early in the morning with chest pain with EKG with diffiuse ST elevation. Echo with pericardial effusion. Stress test negative for ischemia. Cardiology was consulted, recommended starting patient on Ibuprofen and colchicine , and cleared for discharge with follow up with cardiology next week.   1.chest pain-2/2 pericarditis Positional and with inspiration Found with diffuse ST elevation, concern for pericarditis Cardiology was consulted Stress test negative for ischemia Echo with small pericardial effusion, no evidence of tamponade Cardiology recommended  ibuprofen 800 mg 3 times daily  along with colchicine. Discussed with patient possibility of colchicine causing diarrhea Also discussed with patient about ibuprofen side effects including AKI and given signs and symptoms of monitoring in case he experiences defect and to notify cardiology or PCP right away Avoid other NSAIDs       2. H/o Diabetes type II controlled A1c 6.0 Continue home management   3. History of HTN Stable continue home meds          4. History of HLD Continue statins Fasting lipid panel overall controlled, HDL 38, LDL 76     5. History of CHF Continue metoprolol Euvolemic on exam No acute exacerbation   6. History of GERD Continue PPI   7. History of BPH - Continue Flomax   8.  History of LUL pulm nodule, unchanged - recommend outpatient follow up in 3-6 months per radiology recommendations   9. History of marijuana use  - Advised cessation      Discharge Diagnoses:  Principal Problem:   Chest pain, rule out acute myocardial infarction Active Problems:   Primary hypertension   Pericarditis   Hyperlipidemia   Diabetes type 2, controlled [facility])    Discharge Instructions  Discharge Instructions     Call MD for:  severe uncontrolled pain   Complete by: As directed    Diet - low sodium heart healthy   Complete by: As directed    Discharge instructions   Complete by: As directed    Do not take aspirin or other forms of NSAIDS while on ibuprofen. Watch for leg swelling, urination, if urination slowing down call doctor. If leg swelling call your doctor. Hydrate. Follow up with cardiology next week Dr. Mylo Red. Needs Lab next week.   Increase activity slowly   Complete by: As directed       Allergies as of 03/25/2022   No Known Allergies      Medication List     STOP taking these medications    acetaminophen 650 MG CR tablet Commonly known as: TYLENOL   aspirin EC 81 MG tablet   HYDROcodone-acetaminophen 5-325 MG tablet Commonly known as: NORCO/VICODIN  TAKE these medications    albuterol 108 (90 Base) MCG/ACT inhaler Commonly known as: VENTOLIN HFA Inhale 2 puffs into the lungs every 6 (six) hours as needed for wheezing or shortness of breath.   amLODipine 10 MG tablet Commonly known as: NORVASC Take 10 mg by mouth daily.   colchicine 0.6 MG tablet Take 1 tablet (0.6 mg total) by mouth 2 (two) times daily for 10 days.   cyclobenzaprine 10 MG tablet Commonly  known as: FLEXERIL Take 10 mg by mouth 3 (three) times daily as needed for muscle spasms.   diclofenac Sodium 1 % Gel Commonly known as: VOLTAREN Apply 2 g topically daily as needed (pain).   enalapril 2.5 MG tablet Commonly known as: VASOTEC Take 5 mg by mouth daily.   gabapentin 300 MG capsule Commonly known as: NEURONTIN Take 300 mg by mouth at bedtime.   ibuprofen 800 MG tablet Commonly known as: ADVIL Take 1 tablet (800 mg total) by mouth 3 (three) times daily for 8 days.   metFORMIN 1000 MG tablet Commonly known as: GLUCOPHAGE Take 1,000 mg by mouth daily with breakfast.   metoprolol succinate 50 MG 24 hr tablet Commonly known as: TOPROL-XL Take 50 mg by mouth daily. Take with or immediately following a meal.   omeprazole 20 MG tablet Commonly known as: PRILOSEC OTC Take 2 tablets (40 mg total) by mouth 2 (two) times daily for 28 days. What changed:  how much to take when to take this   Plenvu 140 g Solr Generic drug: PEG-KCl-NaCl-NaSulf-Na Asc-C Take 1 kit by mouth as directed. Use coupon: BIN: [id number] PNC: CNRX Group: [id number] ID: [id number]   tamsulosin 0.4 MG Caps capsule Commonly known as: FLOMAX Take 1 capsule (0.4 mg total) by mouth daily.        Follow-up Information     Kate Sable, MD Follow up on 03/31/2022.   Specialties: Cardiology, Radiology Why: follow up with cardiology 10:55 Wednesday with Cadence Kathlen Mody, [place] Contact information: [place] [address] [phone]         Ranae Plumber, [place] Follow up in 1 week(s).   Specialty: Family Medicine Contact information: [place] [address] [phone]                No Known Allergies  Consultations: Cardiology   Procedures/Studies: CT Angio Chest PE W and/or Wo Contrast  Result Date: 03/24/2022 CLINICAL DATA:  Substernal chest pain with shortness of breath. Nausea and diaphoresis since last night. EXAM: CT ANGIOGRAPHY  CHEST WITH CONTRAST TECHNIQUE: Multidetector CT imaging of the chest was performed using the standard protocol during bolus administration of intravenous contrast. Multiplanar CT image reconstructions and MIPs were obtained to evaluate the vascular anatomy. RADIATION DOSE REDUCTION: This exam was performed according to the departmental dose-optimization program which includes automated exposure control, adjustment of the mA and/or kV according to patient size and/or use of iterative reconstruction technique. CONTRAST:  40mL OMNIPAQUE IOHEXOL 350 MG/ML SOLN COMPARISON:  Radiographs dated Mar 24, 2022; PET-CT dated May 08, 2021 FINDINGS: Cardiovascular: Satisfactory opacification of the pulmonary arteries to the segmental level. No evidence of pulmonary embolism. Normal heart size. No pericardial effusion. Coronary artery atherosclerotic calcifications. Aorta is normal in caliber with atherosclerotic calcification of the aortic arch. Mediastinum/Nodes: No enlarged mediastinal, hilar, or axillary lymph nodes. Thyroid gland, trachea, and esophagus demonstrate no significant findings. Lungs/Pleura: 1.0 x 1.6 x 1.2 cm ovoid opacity in the anterior aspect of the left upper lobe. This  nodule was present on prior PET-CT examination of May 08, 2021 and has not significantly changed since prior examination. Lungs otherwise clear without evidence of pneumonia or pulmonary edema. Upper Abdomen: Cholecystectomy changes. Musculoskeletal: No chest wall abnormality. No acute or significant osseous findings. Review of the MIP images confirms the above findings. IMPRESSION: 1.  No evidence of pulmonary embolism. 2.  No evidence of pneumonia or pulmonary edema. 3. 1.0 x 1.6 x 1.2 cm ovoid opacity in the anterior aspect of the left upper lobe. This nodule was present on prior PET-CT examination of May 08, 2021 and has not significantly changed since. Recommend follow-up CT in 3 months or PET/CT or Biopsy for further evaluation. This  recommendation follows the consensus statement: "Guidelines for Management of Small Pulmonary Nodules Detected on CT Scans: A Statement from the [employer]" as published in Radiology 2005; 237:395-400. Aortic Atherosclerosis (ICD10-I70.0). Electronically Signed   By: Keane Police D.O.   On: 03/24/2022 10:09   NM Myocar Multi W/Spect W/Wall Motion / EF  Result Date: 03/25/2022 Pharmacological myocardial perfusion imaging study with no significant  ischemia Normal wall motion, EF estimated at 68% No EKG changes concerning for ischemia at peak stress or in recovery. Resting EKG with nonspecific ST abnormality V3 through V6, 2, 3, aVF CT attenuation correction images with mild aortic atherosclerosis, no significant coronary calcification Low risk scan Signed, Esmond Plants, MD, Ph.D [facility]   DG Chest Port 1 View  Result Date: 03/24/2022 CLINICAL DATA:  Sub sternal chest pain with shortness of breath. EXAM: PORTABLE CHEST 1 VIEW COMPARISON:  03/14/2018 FINDINGS: Heart size and mediastinal contours are unremarkable. There is no pleural effusion or edema identified. No airspace opacities identified. The visualized osseous structures are unremarkable. IMPRESSION: No active disease. Electronically Signed   By: Kerby Moors M.D.   On: 03/24/2022 08:46   ECHOCARDIOGRAM COMPLETE  Result Date: 03/25/2022    ECHOCARDIOGRAM REPORT   Patient Name:   Kevin Esparza Date of Exam: 03/25/2022 Medical Rec #:  [id number]        Height:       70.0 in Accession #:    [id number]       Weight:       180.0 lb Date of Birth:  01/06/1961         BSA:          1.996 m Patient Age:    4 years         BP:           116/63 mmHg Patient Gender: M                HR:           98 bpm. Exam Location:  [facility] Procedure: 2D Echo, Cardiac Doppler and Color Doppler Indications:     Chest pain R07.9  History:         Patient has no prior history of Echocardiogram examinations.                  CHF; Risk Factors:Hypertension and  Diabetes.  Sonographer:     Sherrie Sport Referring Phys:  [id number] Rise Mu Diagnosing Phys: Ida Rogue MD  Sonographer Comments: Suboptimal apical window. IMPRESSIONS  1. Left ventricular ejection fraction, by estimation, is 60 to 65%. The left ventricle has normal function. The left ventricle has no regional wall motion abnormalities. Left ventricular diastolic parameters are consistent with Grade I diastolic dysfunction (impaired relaxation).  2. Right ventricular  systolic function is normal. The right ventricular size is normal. There is normal pulmonary artery systolic pressure. The estimated right ventricular systolic pressure is 29.9 mmHg.  3. A small pericardial effusion is present. Estimated 0.8 cm or less circumferential. There is no evidence of cardiac tamponade.  4. The mitral valve is normal in structure. No evidence of mitral valve regurgitation. No evidence of mitral stenosis.  5. The aortic valve is tricuspid. Aortic valve regurgitation is not visualized. No aortic stenosis is present.  6. The inferior vena cava is normal in size with greater than 50% respiratory variability, suggesting right atrial pressure of 3 mmHg. FINDINGS  Left Ventricle: Left ventricular ejection fraction, by estimation, is 60 to 65%. The left ventricle has normal function. The left ventricle has no regional wall motion abnormalities. The left ventricular internal cavity size was normal in size. There is  no left ventricular hypertrophy. Left ventricular diastolic parameters are consistent with Grade I diastolic dysfunction (impaired relaxation). Right Ventricle: The right ventricular size is normal. No increase in right ventricular wall thickness. Right ventricular systolic function is normal. There is normal pulmonary artery systolic pressure. The tricuspid regurgitant velocity is 1.47 m/s, and  with an assumed right atrial pressure of 5 mmHg, the estimated right ventricular systolic pressure is 37.1 mmHg. Left Atrium:  Left atrial size was normal in size. Right Atrium: Right atrial size was normal in size. Pericardium: A small pericardial effusion is present. There is no evidence of cardiac tamponade. Mitral Valve: The mitral valve is normal in structure. No evidence of mitral valve regurgitation. No evidence of mitral valve stenosis. MV peak gradient, 2.1 mmHg. The mean mitral valve gradient is 1.0 mmHg. Tricuspid Valve: The tricuspid valve is normal in structure. Tricuspid valve regurgitation is mild . No evidence of tricuspid stenosis. Aortic Valve: The aortic valve is tricuspid. Aortic valve regurgitation is not visualized. No aortic stenosis is present. Aortic valve mean gradient measures 2.0 mmHg. Aortic valve peak gradient measures 3.8 mmHg. Aortic valve area, by VTI measures 3.06 cm. Pulmonic Valve: The pulmonic valve was normal in structure. Pulmonic valve regurgitation is not visualized. No evidence of pulmonic stenosis. Aorta: The aortic root is normal in size and structure. Venous: The inferior vena cava is normal in size with greater than 50% respiratory variability, suggesting right atrial pressure of 3 mmHg. IAS/Shunts: No atrial level shunt detected by color flow Doppler.  LEFT VENTRICLE PLAX 2D LVIDd:         5.00 cm   Diastology LVIDs:         3.40 cm   LV e' medial:    5.66 cm/s LV PW:         1.00 cm   LV E/e' medial:  9.5 LV IVS:        1.00 cm   LV e' lateral:   5.11 cm/s LVOT diam:     2.00 cm   LV E/e' lateral: 10.6 LV SV:         50 LV SV Index:   25 LVOT Area:     3.14 cm  LEFT ATRIUM             Index        RIGHT ATRIUM           Index LA diam:        3.00 cm 1.50 cm/m   RA Area:     18.60 cm LA Vol (A2C):   34.9 ml 17.49 ml/m  RA Volume:  54.70 ml  27.41 ml/m LA Vol (A4C):   34.0 ml 17.03 ml/m LA Biplane Vol: 34.6 ml 17.34 ml/m  AORTIC VALVE                    PULMONIC VALVE AV Area (Vmax):    2.73 cm     PV Vmax:        0.89 m/s AV Area (Vmean):   2.82 cm     PV Vmean:       62.550 cm/s AV  Area (VTI):     3.06 cm     PV VTI:         0.137 m AV Vmax:           97.60 cm/s   PV Peak grad:   3.2 mmHg AV Vmean:          70.300 cm/s  PV Mean grad:   1.5 mmHg AV VTI:            0.164 m      RVOT Peak grad: 4 mmHg AV Peak Grad:      3.8 mmHg AV Mean Grad:      2.0 mmHg LVOT Vmax:         84.90 cm/s LVOT Vmean:        63.100 cm/s LVOT VTI:          0.160 m LVOT/AV VTI ratio: 0.98  AORTA Ao Root diam: 3.20 cm MITRAL VALVE               TRICUSPID VALVE MV Area (PHT): 5.23 cm    TR Peak grad:   8.6 mmHg MV Area VTI:   4.02 cm    TR Vmax:        147.00 cm/s MV Peak grad:  2.1 mmHg MV Mean grad:  1.0 mmHg    SHUNTS MV Vmax:       0.73 m/s    Systemic VTI:  0.16 m MV Vmean:      47.6 cm/s   Systemic Diam: 2.00 cm MV Decel Time: 145 msec    Pulmonic VTI:  0.137 m MV E velocity: 54.00 cm/s MV A velocity: 71.60 cm/s MV E/A ratio:  0.75 Ida Rogue MD Electronically signed by Ida Rogue MD Signature Date/Time: 03/25/2022/2:46:30 PM    Final       Subjective: No shortness of breath.  Chest pain about the same  Discharge Exam: Vitals:   03/25/22 1233 03/25/22 1444  BP: 116/63 112/63  Pulse: 98 (!) 104  Resp: 20 19  Temp: 98.1 F (36.7 C) 98.5 F (36.9 C)  SpO2: 99% 99%   Vitals:   03/25/22 0453 03/25/22 0722 03/25/22 1233 03/25/22 1444  BP: 123/72 111/64 116/63 112/63  Pulse: 93 91 98 (!) 104  Resp:  $Remo'19 20 19  'cikid$ Temp:  98.1 F (36.7 C) 98.1 F (36.7 C) 98.5 F (36.9 C)  TempSrc:    Oral  SpO2: 96% 96% 99% 99%  Weight:      Height:        General: Pt is alert, awake, not in acute distress Cardiovascular: RRR, S1/S2 +, no rubs, no gallops Respiratory: CTA bilaterally, no wheezing, no rhonchi Abdominal: Soft, NT, ND, bowel sounds + Extremities: no edema, no cyanosis    The results of significant diagnostics from this hospitalization (including imaging, microbiology, ancillary and laboratory) are listed below for reference.     Microbiology: Recent Results (from the past 240  hour(s))  Resp  Panel by RT-PCR (Flu A&B, Covid) Nasopharyngeal Swab     Status: None   Collection Time: 03/24/22  6:18 PM   Specimen: Nasopharyngeal Swab; Nasopharyngeal(NP) swabs in vial transport medium  Result Value Ref Range Status   SARS Coronavirus 2 by RT PCR NEGATIVE NEGATIVE Final    Comment: (NOTE) SARS-CoV-2 target nucleic acids are NOT DETECTED.  The SARS-CoV-2 RNA is generally detectable in upper respiratory specimens during the acute phase of infection. The lowest concentration of SARS-CoV-2 viral copies this assay can detect is 138 copies/mL. A negative result does not preclude SARS-Cov-2 infection and should not be used as the sole basis for treatment or other patient management decisions. A negative result may occur with  improper specimen collection/handling, submission of specimen other than nasopharyngeal swab, presence of viral mutation(s) within the areas targeted by this assay, and inadequate number of viral copies(<138 copies/mL). A negative result must be combined with clinical observations, patient history, and epidemiological information. The expected result is Negative.  Fact Sheet for Patients:  EntrepreneurPulse.com.au  Fact Sheet for Healthcare Providers:  IncredibleEmployment.be  This test is no t yet approved or cleared by the [place] FDA and  has been authorized for detection and/or diagnosis of SARS-CoV-2 by FDA under an Emergency Use Authorization (EUA). This EUA will remain  in effect (meaning this test can be used) for the duration of the COVID-19 declaration under Section 564(b)(1) of the Act, 21 U.S.C.section 360bbb-3(b)(1), unless the authorization is terminated  or revoked sooner.       Influenza A by PCR NEGATIVE NEGATIVE Final   Influenza B by PCR NEGATIVE NEGATIVE Final    Comment: (NOTE) The Xpert Xpress SARS-CoV-2/FLU/RSV plus assay is intended as an aid in the diagnosis of influenza from  Nasopharyngeal swab specimens and should not be used as a sole basis for treatment. Nasal washings and aspirates are unacceptable for Xpert Xpress SARS-CoV-2/FLU/RSV testing.  Fact Sheet for Patients: EntrepreneurPulse.com.au  Fact Sheet for Healthcare Providers: IncredibleEmployment.be  This test is not yet approved or cleared by the [place] FDA and has been authorized for detection and/or diagnosis of SARS-CoV-2 by FDA under an Emergency Use Authorization (EUA). This EUA will remain in effect (meaning this test can be used) for the duration of the COVID-19 declaration under Section 564(b)(1) of the Act, 21 U.S.C. section 360bbb-3(b)(1), unless the authorization is terminated or revoked.  Performed at [facility], [address] [place], [place] [address]      Labs: BNP (last 3 results) No results for input(s): BNP in the last 8760 hours. Basic Metabolic Panel: Recent Labs  Lab 03/24/22 0833 03/24/22 2033  NA 135  --   K 3.5  --   CL 97*  --   CO2 25  --   GLUCOSE 186*  --   BUN 6*  --   CREATININE 1.07 1.14  CALCIUM 9.4  --    Liver Function Tests: Recent Labs  Lab 03/24/22 0833  AST 24  ALT 23  ALKPHOS 83  BILITOT 1.8*  PROT 7.9  ALBUMIN 3.9   Recent Labs  Lab 03/24/22 0833  LIPASE 27   No results for input(s): AMMONIA in the last 168 hours. CBC: Recent Labs  Lab 03/24/22 0833 03/24/22 2033 03/25/22 0503  WBC 11.5* 9.1 7.8  HGB 15.5 12.6* 12.2*  HCT 46.0 37.1* 37.0*  MCV 83.9 84.5 86.0  PLT 220 163 157   Cardiac Enzymes: No results for input(s): CKTOTAL, CKMB, CKMBINDEX, TROPONINI in  the last 168 hours. BNP: Invalid input(s): POCBNP CBG: Recent Labs  Lab 03/24/22 2124 03/24/22 2311 03/25/22 0258 03/25/22 0724 03/25/22 1230  GLUCAP 148* 120* 115* 131* 143*   D-Dimer No results for input(s): DDIMER in the last 72 hours. Hgb A1c Recent Labs    03/24/22 2033  HGBA1C 6.0*    Lipid Profile Recent Labs    03/25/22 0147  CHOL 133  HDL 38*  LDLCALC 76  TRIG 93  CHOLHDL 3.5   Thyroid function studies Recent Labs    03/24/22 2033  TSH 3.354   Anemia work up No results for input(s): VITAMINB12, FOLATE, FERRITIN, TIBC, IRON, RETICCTPCT in the last 72 hours. Urinalysis    Component Value Date/Time   APPEARANCEUR Clear 02/10/2021 1108   GLUCOSEU Negative 02/10/2021 1108   BILIRUBINUR Negative 02/10/2021 1108   PROTEINUR Negative 02/10/2021 1108   NITRITE Negative 02/10/2021 1108   LEUKOCYTESUR Negative 02/10/2021 1108   Sepsis Labs Invalid input(s): PROCALCITONIN,  WBC,  LACTICIDVEN Microbiology Recent Results (from the past 240 hour(s))  Resp Panel by RT-PCR (Flu A&B, Covid) Nasopharyngeal Swab     Status: None   Collection Time: 03/24/22  6:18 PM   Specimen: Nasopharyngeal Swab; Nasopharyngeal(NP) swabs in vial transport medium  Result Value Ref Range Status   SARS Coronavirus 2 by RT PCR NEGATIVE NEGATIVE Final    Comment: (NOTE) SARS-CoV-2 target nucleic acids are NOT DETECTED.  The SARS-CoV-2 RNA is generally detectable in upper respiratory specimens during the acute phase of infection. The lowest concentration of SARS-CoV-2 viral copies this assay can detect is 138 copies/mL. A negative result does not preclude SARS-Cov-2 infection and should not be used as the sole basis for treatment or other patient management decisions. A negative result may occur with  improper specimen collection/handling, submission of specimen other than nasopharyngeal swab, presence of viral mutation(s) within the areas targeted by this assay, and inadequate number of viral copies(<138 copies/mL). A negative result must be combined with clinical observations, patient history, and epidemiological information. The expected result is Negative.  Fact Sheet for Patients:  EntrepreneurPulse.com.au  Fact Sheet for Healthcare Providers:   IncredibleEmployment.be  This test is no t yet approved or cleared by the [place] FDA and  has been authorized for detection and/or diagnosis of SARS-CoV-2 by FDA under an Emergency Use Authorization (EUA). This EUA will remain  in effect (meaning this test can be used) for the duration of the COVID-19 declaration under Section 564(b)(1) of the Act, 21 U.S.C.section 360bbb-3(b)(1), unless the authorization is terminated  or revoked sooner.       Influenza A by PCR NEGATIVE NEGATIVE Final   Influenza B by PCR NEGATIVE NEGATIVE Final    Comment: (NOTE) The Xpert Xpress SARS-CoV-2/FLU/RSV plus assay is intended as an aid in the diagnosis of influenza from Nasopharyngeal swab specimens and should not be used as a sole basis for treatment. Nasal washings and aspirates are unacceptable for Xpert Xpress SARS-CoV-2/FLU/RSV testing.  Fact Sheet for Patients: EntrepreneurPulse.com.au  Fact Sheet for Healthcare Providers: IncredibleEmployment.be  This test is not yet approved or cleared by the [place] FDA and has been authorized for detection and/or diagnosis of SARS-CoV-2 by FDA under an Emergency Use Authorization (EUA). This EUA will remain in effect (meaning this test can be used) for the duration of the COVID-19 declaration under Section 564(b)(1) of the Act, 21 U.S.C. section 360bbb-3(b)(1), unless the authorization is terminated or revoked.  Performed at [facility], [address],  [place], [place] [address]      Time coordinating discharge: Over 30 minutes  SIGNED:   Nolberto Hanlon, MD  [facility] 03/26/2022, 3:36 PM Pager   If 7PM-7AM, please contact night-coverage [email or web address] Password [email or web address]

## 2022-03-25 NOTE — Progress Notes (Signed)
PROGRESS NOTE    Kevin Esparza  XBM:[id number] DOB: 1961/07/04 DOA: 03/24/2022 PCP: Ranae Plumber, PA    Brief Narrative:  Kevin Esparza is a 61 y.o. male with a known history of DM, HTN, HLD, CHF, GERD presents to the emergency department for evaluation of chest pain.  Patient was in a usual state of health until 20 hours ago when he developed substernal and epigastric chest pain while at rest with associated diaphoresis and vomiting x1.  Pain is worse with inspiration and has persisted through the night, not improved here by Maalox, morphine, nitro or [place]  5/25 early this a.m. patient had abnormal EKG with diffuse ST elevation.  Troponin negative.  Underwent nuclear stress pending Echo pending  Consultants:  Cardiology  Procedures: Nuclear stress and echo  Antimicrobials:      Subjective: Cp with inspiration, lying flat and sitting up , or also with coughing. Nothing seems to make it better. No sob  Objective: Vitals:   03/25/22 0447 03/25/22 0453 03/25/22 0722 03/25/22 1233  BP: 114/71 123/72 111/64 116/63  Pulse: 95 93 91 98  Resp: '20  19 20  '$ Temp:   98.1 F (36.7 C) 98.1 F (36.7 C)  TempSrc:      SpO2: 95% 96% 96% 99%  Weight:      Height:        Intake/Output Summary (Last 24 hours) at 03/25/2022 1355 Last data filed at 03/25/2022 0315 Gross per 24 hour  Intake 500 ml  Output 1620 ml  Net -1120 ml   Filed Weights   03/24/22 0831 03/24/22 1941  Weight: 80.7 kg 81.6 kg    Examination: Calm, NAD Cta no w/r Reg s1/s2 no gallop Soft benign +bs No edema Aaoxox3  Mood and affect appropriate in current setting     Data Reviewed: I have personally reviewed following labs and imaging studies  CBC: Recent Labs  Lab 03/24/22 0833 03/24/22 2033 03/25/22 0503  WBC 11.5* 9.1 7.8  HGB 15.5 12.6* 12.2*  HCT 46.0 37.1* 37.0*  MCV 83.9 84.5 86.0  PLT 220 163 027   Basic Metabolic Panel: Recent Labs  Lab 03/24/22 0833 03/24/22 2033  NA 135   --   K 3.5  --   CL 97*  --   CO2 25  --   GLUCOSE 186*  --   BUN 6*  --   CREATININE 1.07 1.14  CALCIUM 9.4  --    GFR: Estimated Creatinine Clearance: 69.4 mL/min (by C-G formula based on SCr of 1.14 mg/dL). Liver Function Tests: Recent Labs  Lab 03/24/22 0833  AST 24  ALT 23  ALKPHOS 83  BILITOT 1.8*  PROT 7.9  ALBUMIN 3.9   Recent Labs  Lab 03/24/22 0833  LIPASE 27   No results for input(s): AMMONIA in the last 168 hours. Coagulation Profile: No results for input(s): INR, PROTIME in the last 168 hours. Cardiac Enzymes: No results for input(s): CKTOTAL, CKMB, CKMBINDEX, TROPONINI in the last 168 hours. BNP (last 3 results) No results for input(s): PROBNP in the last 8760 hours. HbA1C: Recent Labs    03/24/22 2033  HGBA1C 6.0*   CBG: Recent Labs  Lab 03/24/22 2124 03/24/22 2311 03/25/22 0258 03/25/22 0724 03/25/22 1230  GLUCAP 148* 120* 115* 131* 143*   Lipid Profile: Recent Labs    03/25/22 0147  CHOL 133  HDL 38*  LDLCALC 76  TRIG 93  CHOLHDL 3.5   Thyroid Function Tests: Recent Labs    03/24/22 2033  TSH 3.354   Anemia Panel: No results for input(s): VITAMINB12, FOLATE, FERRITIN, TIBC, IRON, RETICCTPCT in the last 72 hours. Sepsis Labs: No results for input(s): PROCALCITON, LATICACIDVEN in the last 168 hours.  Recent Results (from the past 240 hour(s))  Resp Panel by RT-PCR (Flu A&B, Covid) Nasopharyngeal Swab     Status: None   Collection Time: 03/24/22  6:18 PM   Specimen: Nasopharyngeal Swab; Nasopharyngeal(NP) swabs in vial transport medium  Result Value Ref Range Status   SARS Coronavirus 2 by RT PCR NEGATIVE NEGATIVE Final    Comment: (NOTE) SARS-CoV-2 target nucleic acids are NOT DETECTED.  The SARS-CoV-2 RNA is generally detectable in upper respiratory specimens during the acute phase of infection. The lowest concentration of SARS-CoV-2 viral copies this assay can detect is 138 copies/mL. A negative result does not  preclude SARS-Cov-2 infection and should not be used as the sole basis for treatment or other patient management decisions. A negative result may occur with  improper specimen collection/handling, submission of specimen other than nasopharyngeal swab, presence of viral mutation(s) within the areas targeted by this assay, and inadequate number of viral copies(<138 copies/mL). A negative result must be combined with clinical observations, patient history, and epidemiological information. The expected result is Negative.  Fact Sheet for Patients:  EntrepreneurPulse.com.au  Fact Sheet for Healthcare Providers:  IncredibleEmployment.be  This test is no t yet approved or cleared by the [place] FDA and  has been authorized for detection and/or diagnosis of SARS-CoV-2 by FDA under an Emergency Use Authorization (EUA). This EUA will remain  in effect (meaning this test can be used) for the duration of the COVID-19 declaration under Section 564(b)(1) of the Act, 21 U.S.C.section 360bbb-3(b)(1), unless the authorization is terminated  or revoked sooner.       Influenza A by PCR NEGATIVE NEGATIVE Final   Influenza B by PCR NEGATIVE NEGATIVE Final    Comment: (NOTE) The Xpert Xpress SARS-CoV-2/FLU/RSV plus assay is intended as an aid in the diagnosis of influenza from Nasopharyngeal swab specimens and should not be used as a sole basis for treatment. Nasal washings and aspirates are unacceptable for Xpert Xpress SARS-CoV-2/FLU/RSV testing.  Fact Sheet for Patients: EntrepreneurPulse.com.au  Fact Sheet for Healthcare Providers: IncredibleEmployment.be  This test is not yet approved or cleared by the [place] FDA and has been authorized for detection and/or diagnosis of SARS-CoV-2 by FDA under an Emergency Use Authorization (EUA). This EUA will remain in effect (meaning this test can be used) for the  duration of the COVID-19 declaration under Section 564(b)(1) of the Act, 21 U.S.C. section 360bbb-3(b)(1), unless the authorization is terminated or revoked.  Performed at [facility], [address], [place], [place] [address]          Radiology Studies: CT Angio Chest PE W and/or Wo Contrast  Result Date: 03/24/2022 CLINICAL DATA:  Substernal chest pain with shortness of breath. Nausea and diaphoresis since last night. EXAM: CT ANGIOGRAPHY CHEST WITH CONTRAST TECHNIQUE: Multidetector CT imaging of the chest was performed using the standard protocol during bolus administration of intravenous contrast. Multiplanar CT image reconstructions and MIPs were obtained to evaluate the vascular anatomy. RADIATION DOSE REDUCTION: This exam was performed according to the departmental dose-optimization program which includes automated exposure control, adjustment of the mA and/or kV according to patient size and/or use of iterative reconstruction technique. CONTRAST:  40m OMNIPAQUE IOHEXOL 350 MG/ML SOLN COMPARISON:  Radiographs dated Mar 24, 2022; PET-CT dated May 08, 2021 FINDINGS: Cardiovascular:  Satisfactory opacification of the pulmonary arteries to the segmental level. No evidence of pulmonary embolism. Normal heart size. No pericardial effusion. Coronary artery atherosclerotic calcifications. Aorta is normal in caliber with atherosclerotic calcification of the aortic arch. Mediastinum/Nodes: No enlarged mediastinal, hilar, or axillary lymph nodes. Thyroid gland, trachea, and esophagus demonstrate no significant findings. Lungs/Pleura: 1.0 x 1.6 x 1.2 cm ovoid opacity in the anterior aspect of the left upper lobe. This nodule was present on prior PET-CT examination of May 08, 2021 and has not significantly changed since prior examination. Lungs otherwise clear without evidence of pneumonia or pulmonary edema. Upper Abdomen: Cholecystectomy changes. Musculoskeletal: No chest wall abnormality.  No acute or significant osseous findings. Review of the MIP images confirms the above findings. IMPRESSION: 1.  No evidence of pulmonary embolism. 2.  No evidence of pneumonia or pulmonary edema. 3. 1.0 x 1.6 x 1.2 cm ovoid opacity in the anterior aspect of the left upper lobe. This nodule was present on prior PET-CT examination of May 08, 2021 and has not significantly changed since. Recommend follow-up CT in 3 months or PET/CT or Biopsy for further evaluation. This recommendation follows the consensus statement: "Guidelines for Management of Small Pulmonary Nodules Detected on CT Scans: A Statement from the [place]" as published in Radiology 2005; 237:395-400. Aortic Atherosclerosis (ICD10-I70.0). Electronically Signed   By: Keane Police D.O.   On: 03/24/2022 10:09   DG Chest Port 1 View  Result Date: 03/24/2022 CLINICAL DATA:  Sub sternal chest pain with shortness of breath. EXAM: PORTABLE CHEST 1 VIEW COMPARISON:  03/14/2018 FINDINGS: Heart size and mediastinal contours are unremarkable. There is no pleural effusion or edema identified. No airspace opacities identified. The visualized osseous structures are unremarkable. IMPRESSION: No active disease. Electronically Signed   By: Kerby Moors M.D.   On: 03/24/2022 08:46        Scheduled Meds:  amLODipine  10 mg Oral Daily   aspirin EC  81 mg Oral Daily   atorvastatin  40 mg Oral Daily   enalapril  5 mg Oral Daily   gabapentin  300 mg Oral QHS   ibuprofen  800 mg Oral TID   insulin aspart  0-15 Units Subcutaneous Q4H   metoprolol succinate  50 mg Oral Daily   pantoprazole (PROTONIX) IV  40 mg Intravenous Q12H   sucralfate  1 g Oral TID WC & HS   tamsulosin  0.4 mg Oral Daily   Continuous Infusions:  Assessment & Plan:   Principal Problem:   Chest pain, rule out acute myocardial infarction   1.chest pain Positional and with inspiration Found with diffuse ST elevation, concern for pericarditis Cardiology was  consulted Echo and stress test both pending We will start ibuprofen 800 mg 3 times daily for now Follow-up on above testing results Dc asa since starting nsaids Monitor renal fxn   2. H/o Diabetes Riss , bg stable   3. History of HTN Stable Continue metoprolol, amlodipine and enalapril      4. History of HLD Continue statins Fasting lipid panel overall controlled, HDL 38, LDL 76    5. History of CHF Continue metoprolol Euvolemic on exam No acute exacerbation   6. History of GERD - Trial of IV Protonix   7. History of BPH - Continue Flomax   8.  History of LUL pulm nodule, unchanged - recommend outpatient follow up in 3-6 months per radiology recommendations   9. History of marijuana use  - Advised cessation  DVT prophylaxis: scd Code Status: Full Family Communication: None at bedside Disposition Plan: Back home Status is: Observation The patient will require care spanning > 2 midnights and should be moved to inpatient because: due to still having pain. W/u pending        LOS: 0 days   Time spent: 35 minutes    Nolberto Hanlon, MD [facility] Pager [phone]  If 7PM-7AM, please contact night-coverage 03/25/2022, 1:55 PM

## 2022-03-25 NOTE — Progress Notes (Addendum)
Progress Note  Patient Name: Kevin Esparza Date of Encounter: 03/25/2022  [facility] Cardiologist: Kevin Sable, MD  Subjective   Reports he continues to have chest pain, sharp, knifelike, does not feel like musculoskeletal etiology, " I have had broken ribs before" Symptoms starting evening of May 23, have persisted Cardiac enzymes negative Echocardiogram pending EKG with diffuse ST elevation, new since 2019 Was started on PPI, Carafate Sed rate borderline elevated CRP elevated 18  Inpatient Medications    Scheduled Meds:  amLODipine  10 mg Oral Daily   aspirin EC  81 mg Oral Daily   atorvastatin  40 mg Oral Daily   enalapril  5 mg Oral Daily   gabapentin  300 mg Oral QHS   insulin aspart  0-15 Units Subcutaneous Q4H   metoprolol succinate  50 mg Oral Daily   pantoprazole (PROTONIX) IV  40 mg Intravenous Q12H   sucralfate  1 g Oral TID WC & HS   tamsulosin  0.4 mg Oral Daily   Continuous Infusions:  PRN Meds: acetaminophen, albuterol, ALPRAZolam, cyclobenzaprine, HYDROmorphone (DILAUDID) injection, ondansetron (ZOFRAN) IV, zolpidem   Vital Signs    Vitals:   03/25/22 0304 03/25/22 0447 03/25/22 0453 03/25/22 0722  BP: (!) 132/59 114/71 123/72 111/64  Pulse: 97 95 93 91  Resp: '20 20  19  '$ Temp: 98.1 F (36.7 C)   98.1 F (36.7 C)  TempSrc: Oral     SpO2: 99% 95% 96% 96%  Weight:      Height:        Intake/Output Summary (Last 24 hours) at 03/25/2022 1058 Last data filed at 03/25/2022 0315 Gross per 24 hour  Intake 500 ml  Output 1620 ml  Net -1120 ml      03/24/2022    7:41 PM 03/24/2022    8:31 AM 03/16/2022   11:24 AM  Last 3 Weights  Weight (lbs) 180 lb 178 lb 179 lb 1.6 oz  Weight (kg) 81.647 kg 80.74 kg 81.239 kg      Telemetry    Normal sinus rhythm- Personally Reviewed  ECG     - Personally Reviewed  Physical Exam   GEN: No acute distress.   Neck: No JVD Cardiac: RRR, no murmurs, rubs, or gallops.  Respiratory: Clear  to auscultation bilaterally. GI: Soft, nontender, non-distended  MS: No edema; No deformity. Neuro:  Nonfocal  Psych: Normal affect   Labs    High Sensitivity Troponin:   Recent Labs  Lab 03/24/22 0833 03/24/22 1053 03/24/22 2033 03/25/22 0147 03/25/22 0510  TROPONINIHS '6 6 7 8 7     '$ Chemistry Recent Labs  Lab 03/24/22 0833 03/24/22 2033  NA 135  --   K 3.5  --   CL 97*  --   CO2 25  --   GLUCOSE 186*  --   BUN 6*  --   CREATININE 1.07 1.14  CALCIUM 9.4  --   PROT 7.9  --   ALBUMIN 3.9  --   AST 24  --   ALT 23  --   ALKPHOS 83  --   BILITOT 1.8*  --   GFRNONAA >60 >60  ANIONGAP 13  --     Lipids  Recent Labs  Lab 03/25/22 0147  CHOL 133  TRIG 93  HDL 38*  LDLCALC 76  CHOLHDL 3.5    Hematology Recent Labs  Lab 03/24/22 0833 03/24/22 2033  WBC 11.5* 9.1  RBC 5.48 4.39  HGB 15.5 12.6*  HCT  46.0 37.1*  MCV 83.9 84.5  MCH 28.3 28.7  MCHC 33.7 34.0  RDW 12.7 12.7  PLT 220 163   Thyroid  Recent Labs  Lab 03/24/22 2033  TSH 3.354    BNPNo results for input(s): BNP, PROBNP in the last 168 hours.  DDimer No results for input(s): DDIMER in the last 168 hours.   Radiology    CT Angio Chest PE W and/or Wo Contrast  Result Date: 03/24/2022 CLINICAL DATA:  Substernal chest pain with shortness of breath. Nausea and diaphoresis since last night. EXAM: CT ANGIOGRAPHY CHEST WITH CONTRAST TECHNIQUE: Multidetector CT imaging of the chest was performed using the standard protocol during bolus administration of intravenous contrast. Multiplanar CT image reconstructions and MIPs were obtained to evaluate the vascular anatomy. RADIATION DOSE REDUCTION: This exam was performed according to the departmental dose-optimization program which includes automated exposure control, adjustment of the mA and/or kV according to patient size and/or use of iterative reconstruction technique. CONTRAST:  66m OMNIPAQUE IOHEXOL 350 MG/ML SOLN COMPARISON:  Radiographs dated Mar 24, 2022; PET-CT dated May 08, 2021 FINDINGS: Cardiovascular: Satisfactory opacification of the pulmonary arteries to the segmental level. No evidence of pulmonary embolism. Normal heart size. No pericardial effusion. Coronary artery atherosclerotic calcifications. Aorta is normal in caliber with atherosclerotic calcification of the aortic arch. Mediastinum/Nodes: No enlarged mediastinal, hilar, or axillary lymph nodes. Thyroid gland, trachea, and esophagus demonstrate no significant findings. Lungs/Pleura: 1.0 x 1.6 x 1.2 cm ovoid opacity in the anterior aspect of the left upper lobe. This nodule was present on prior PET-CT examination of May 08, 2021 and has not significantly changed since prior examination. Lungs otherwise clear without evidence of pneumonia or pulmonary edema. Upper Abdomen: Cholecystectomy changes. Musculoskeletal: No chest wall abnormality. No acute or significant osseous findings. Review of the MIP images confirms the above findings. IMPRESSION: 1.  No evidence of pulmonary embolism. 2.  No evidence of pneumonia or pulmonary edema. 3. 1.0 x 1.6 x 1.2 cm ovoid opacity in the anterior aspect of the left upper lobe. This nodule was present on prior PET-CT examination of May 08, 2021 and has not significantly changed since. Recommend follow-up CT in 3 months or PET/CT or Biopsy for further evaluation. This recommendation follows the consensus statement: "Guidelines for Management of Small Pulmonary Nodules Detected on CT Scans: A Statement from the [employer] as published in Radiology 2005; 237:395-400. Aortic Atherosclerosis (ICD10-I70.0). Electronically Signed   By: Kevin PoliceD.O.   On: 03/24/2022 10:09   DG Chest Port 1 View  Result Date: 03/24/2022 CLINICAL DATA:  Sub sternal chest pain with shortness of breath. EXAM: PORTABLE CHEST 1 VIEW COMPARISON:  03/14/2018 FINDINGS: Heart size and mediastinal contours are unremarkable. There is no pleural effusion or edema identified.  No airspace opacities identified. The visualized osseous structures are unremarkable. IMPRESSION: No active disease. Electronically Signed   By: Kevin MoorsM.D.   On: 03/24/2022 08:46    Cardiac Studies   Echo pending Myoview pending  Patient Profile     Kevin Mcvayis a 61y.o. male with a hx of essential hypertension, diabetes, hyperlipidemia, erectile dysfunction, neuroendocrine carcinoma of the stomach, GERD, and marijuana use who is being seen 03/24/2022 for the evaluation of substernal chest pain  Assessment & Plan    Chest pain ST abnormality on EKG in the setting of negative troponin Somewhat atypical presentation though unable to exclude ischemia/pericarditis Myoview pending this morning, coronary calcification noted on CT scan Echocardiogram pending If  nonischemic Myoview, consideration of pericarditis in the setting of elevated CRP and sed rate For pericarditis could treat with high-dose NSAIDs, colchicine twice daily  Shortness of breath In the setting of above Stress test and echocardiogram pending CTA negative for PE   Addendum: Echocardiogram with small circumferential pericardial effusion, no tamponade Stress test with no significant ischemia Would recommend treating with ibuprofen 600-800 3 times daily and colchicine 0.6 twice daily Follow-up in clinic with Dr. Garen Lah   Total encounter time more than 40 minutes  Greater than 50% was spent in counseling and coordination of care with the patient    For questions or updates, please contact [facility] Please consult [email or web address] for contact info under        Signed, Ida Rogue, MD  03/25/2022, 10:58 AM

## 2022-03-25 NOTE — Progress Notes (Signed)
*  PRELIMINARY RESULTS* Echocardiogram 2D Echocardiogram has been performed.  Sherrie Sport 03/25/2022, 2:20 PM

## 2022-03-25 NOTE — Progress Notes (Addendum)
Received a call from bedside RN regarding the patient's abnormal 12 lead EKG, showing STEMI.  Reviewed EKG.  The patient appears to have diffused ST T elevation, a change from prior 12 lead EKG.  Sed rate done earlier elevated 25.  CRP is pending.  Stat troponin ordered.  Came to bedside.  The patient complaints of 8/10 sharp epigastric pain radiating across his upper abdomen.  Lipase done earlier was negative.  Last EGD (12/31/20) showed erythematous mucosa in the antrum. Biopsied.  The patient is on prior to admission PPI.  The patient is conversant.  No dyspnea.  No nausea.  Epigastric pain has not changed.  It has been constant since onset and improved with IV Dilaudid.  He declined SL Nitro stating that the headache he had from it was unbearable.  IV morphine was not helpful.  IV Dilaudid seemed to work.  It brought his pain level down to 4/10 earlier.  Called and discussed the case with cardiology Dr. Aundra Dubin who recommended Heparin drip and, if repeat troponin is negative to discontinue Heparin drip.  The writer ordered stat full dose aspirin, stat troponin, and serial 12 lead EKG q3H x 3.  Heparin drip initiated at cardiology's request.  At the time of this exam, the patient is alert and oriented x 3.  No evidence of volume overload.  No JVD noted.  Lungs are clear to auscultation with good inspiratory efforts.  Heart with regular rate and rhythm.  Intermittently holds his upper abdomen due to the pain.  No peripheral edema.    A 2-D echo has been ordered and is pending.  Differential diagnosis includes acute pericarditis.  Per cardiology, further recommendations will be provided by day-shift cardiology attending.  We will continue to closely monitor and treat as indicated.

## 2022-03-25 NOTE — Care Management Obs Status (Signed)
[place] NOTIFICATION   Patient Details  Name: Kevin Esparza MRN: [id number] Date of Birth: 01-03-1961   Medicare Observation Status Notification Given:  Yes    Candie Chroman, LCSW 03/25/2022, 3:21 PM

## 2022-03-25 NOTE — TOC CM/SW Note (Signed)
Patient has orders to discharge home today. Chart reviewed. PCP is Ranae Plumber, PA-C. On room air. No wounds. His wife will take him home. No TOC needs identified. CSW signing off.  Dayton Scrape, [place]

## 2022-03-25 NOTE — Progress Notes (Signed)
ANTICOAGULATION CONSULT NOTE - Initial Consult  Pharmacy Consult for Heparin  Indication: chest pain/ACS  No Known Allergies  Patient Measurements: Height: _0  (177.8 cm) Weight: 81.6 kg (180 lb) IBW/kg (Calculated) : 73 Heparin Dosing Weight: 81.6 kg   Vital Signs: Temp: 98.1 F (36.7 C) (05/25 0304) Temp Source: Oral (05/25 0304) BP: 123/72 (05/25 0453) Pulse Rate: 93 (05/25 0453)  Labs: Recent Labs    03/24/22 0833 03/24/22 1053 03/24/22 2033 03/25/22 0147  HGB 15.5  --  12.6*  --   HCT 46.0  --  37.1*  --   PLT 220  --  163  --   CREATININE 1.07  --  1.14  --   TROPONINIHS _1 Estimated Creatinine Clearance: 69.4 mL/min (by C-G formula based on SCr of 1.14 mg/dL).   Medical History: Past Medical History:  Diagnosis Date   Anxiety    Arthritis    knees, hands   Cancer (HCC)    CHF (congestive heart failure) (HCC)    Diabetes mellitus without complication (HCC)    type 2   ED (erectile dysfunction)    Elevated PSA    GERD (gastroesophageal reflux disease)    Heartburn    History of BPH    HLD (hyperlipidemia)    HTN (hypertension)    Neuroendocrine carcinoma of stomach ([place]) 02/07/2021   Pneumonia    several times    Medications:  Medications Prior to Admission  Medication Sig Dispense Refill Last Dose   acetaminophen (TYLENOL) 650 MG CR tablet Take 1,300 mg by mouth 2 (two) times daily.   PRN   albuterol (VENTOLIN HFA) 108 (90 Base) MCG/ACT inhaler Inhale 2 puffs into the lungs every 6 (six) hours as needed for wheezing or shortness of breath.   PRN   amLODipine (NORVASC) 10 MG tablet Take 10 mg by mouth daily.   03/23/2022   aspirin 81 MG EC tablet Take 1 tablet (81 mg total) by mouth daily. 30 tablet 12 03/24/2022   cyclobenzaprine (FLEXERIL) 10 MG tablet Take 10 mg by mouth 3 (three) times daily as needed for muscle spasms.   PRN   diclofenac Sodium (VOLTAREN) 1 % GEL Apply 2 g topically daily as needed (pain).   PRN   enalapril  (VASOTEC) 2.5 MG tablet Take 5 mg by mouth daily.   03/23/2022   gabapentin (NEURONTIN) 300 MG capsule Take 300 mg by mouth at bedtime.   03/23/2022   metFORMIN (GLUCOPHAGE) 1000 MG tablet Take 1,000 mg by mouth daily with breakfast.   03/23/2022   metoprolol succinate (TOPROL-XL) 50 MG 24 hr tablet Take 50 mg by mouth daily. Take with or immediately following a meal.   03/23/2022   omeprazole (PRILOSEC OTC) 20 MG tablet Take 2 tablets (40 mg total) by mouth 2 (two) times daily for 28 days. (Patient taking differently: Take 20 mg by mouth every morning.) 112 tablet 0 03/23/2022   tamsulosin (FLOMAX) 0.4 MG CAPS capsule Take 1 capsule (0.4 mg total) by mouth daily. 90 capsule 3 03/23/2022   HYDROcodone-acetaminophen (NORCO/VICODIN) 5-325 MG tablet Take by mouth. (Patient not taking: Reported on 03/24/2022)   Not Taking   PEG-KCl-NaCl-NaSulf-Na Asc-C (PLENVU) 140 g SOLR Take 1 kit by mouth as directed. Use coupon: BIN: [id number] PNC: CNRX Group: [id number] ID: [id number] (Patient not taking: Reported on 03/24/2022) 1 each 0 Not Taking    Assessment: Pharmacy consulted to dose heparin in this 61 year old male with ACS/NSTEMI.  Pt was on heparin 5000 units SQ Q8H , last dose on 5/24 @ 2156. CrCl = 69.4 ml/min   Goal of Therapy:  Heparin level 0.3-0.7 units/ml Monitor platelets by anticoagulation protocol: Yes   Plan:  Give 4000 units bolus x 1 Start heparin infusion at 1000 units/hr Check anti-Xa level in 6 hours and daily while on heparin Continue to monitor H&H and platelets  Kevin Esparza 03/25/2022,5:04 AM

## 2022-03-25 NOTE — Progress Notes (Signed)
Updated cardiology Dr. Aundra Dubin via secure chat on negative repeat troponin.  Heparin drip discontinued as he had requested.

## 2022-03-25 NOTE — Progress Notes (Signed)
Transport taking pt down to NM for stress test via bed.

## 2022-03-25 NOTE — Progress Notes (Signed)
Pt has returned to unit from [facility] stress test.

## 2022-03-25 NOTE — Significant Event (Signed)
Rapid Response Event Note   Reason for Call :  Change in EKG  Initial Focused Assessment:  Patient alert and oriented Vital signs stable. BP 114/71 HR 95 O2 95 on RA. Patient complaining of 7 out of ten chest pain. Patient states the pain is the same sharp feeling it has been all night that has been relieved off and on with pain medication. EKG showing new ST elevation. Cardiology paged and Dr Nevada Crane at beside.     Interventions:  -Trend troponin's -Heparin drip to start depending on troponin's -Asprin -Repeat EKG in an hour      Event Summary:   MD Notified:  Dr.Parachos and Dr Nevada Crane Call [phone] Arrival HWYS:[id number] End Time:0500  Gonzella Lex, RN

## 2022-03-26 DIAGNOSIS — E119 Type 2 diabetes mellitus without complications: Secondary | ICD-10-CM

## 2022-03-26 DIAGNOSIS — E785 Hyperlipidemia, unspecified: Secondary | ICD-10-CM

## 2022-03-26 DIAGNOSIS — I319 Disease of pericardium, unspecified: Secondary | ICD-10-CM

## 2022-03-31 ENCOUNTER — Ambulatory Visit (INDEPENDENT_AMBULATORY_CARE_PROVIDER_SITE_OTHER): Payer: Medicare Other | Admitting: Medical

## 2022-03-31 ENCOUNTER — Encounter: Payer: Self-pay | Admitting: Medical

## 2022-03-31 VITALS — BP 100/60 | HR 107 | Ht 70.0 in | Wt 178.1 lb

## 2022-03-31 DIAGNOSIS — I4891 Unspecified atrial fibrillation: Secondary | ICD-10-CM | POA: Diagnosis not present

## 2022-03-31 DIAGNOSIS — I319 Disease of pericardium, unspecified: Secondary | ICD-10-CM | POA: Diagnosis not present

## 2022-03-31 DIAGNOSIS — I10 Essential (primary) hypertension: Secondary | ICD-10-CM | POA: Diagnosis not present

## 2022-03-31 DIAGNOSIS — E782 Mixed hyperlipidemia: Secondary | ICD-10-CM | POA: Diagnosis not present

## 2022-03-31 MED ORDER — APIXABAN 5 MG PO TABS
5.0000 mg | ORAL_TABLET | Freq: Two times a day (BID) | ORAL | 6 refills | Status: DC
Start: 2022-03-31 — End: 2022-05-13

## 2022-03-31 NOTE — Patient Instructions (Addendum)
Medication Instructions:  - Your physician has recommended you make the following change in your medication:   1) STOP Amlodipine  2) STOP Ibuprofen  3) INCREASE Toprol XL (metoprolol succinate) 50 mg: - take 2 tablets (100 mg) by mouth once daily   4) START Eliquis 5 mg: - take 1 tablet (5 mg) by mouth TWICE daily   Samples Given: Eliquis 5 mg Lot: [id number] Lot: [id number] Exp: 12/2023  Exp: 01/2024 # 3 boxes  # 1 box  *If you need a refill on your cardiac medications before your next appointment, please call your pharmacy*   Lab Work: - Your physician recommends that you have lab work today:  BMP/ CBC/ TSH/ [facility] En[facility] at [facility] 1st desk on the right to check in (REGISTRATION)  Lab hours: Monday- Friday (7:30 am- 5:30 pm)   If you have labs (blood work) drawn today and your tests are completely normal, you will receive your results only by: MyChart Message (if you have MyChart) OR A paper copy in the mail If you have any lab test that is abnormal or we need to change your treatment, we will call you to review the results.   Testing/Procedures: - none ordered   Follow-Up: At [facility], you and your health needs are our priority.  As part of our continuing mission to provide you with exceptional heart care, we have created designated Provider Care Teams.  These Care Teams include your primary Cardiologist (physician) and Advanced Practice Providers (APPs -  Physician Assistants and Nurse Practitioners) who all work together to provide you with the care you need, when you need it.  We recommend signing up for the patient portal called "MyChart".  Sign up information is provided on this After Visit Summary.  MyChart is used to connect with patients for Virtual Visits (Telemedicine).  Patients are able to view lab/test results, encounter notes, upcoming appointments, etc.  Non-urgent messages can be sent to your provider as well.   To learn more about  what you can do with MyChart, go to [email or web address].    Your next appointment:   Wednesday 04/07/22 @ 2:50 pm   The format for your next appointment:   In Person  Provider:   Cadence Kathlen Mody, PA-C    Other Instructions  Apixaban Tablets What is this medication? APIXABAN (a PIX a ban) prevents or treats blood clots. It is also used to lower the risk of stroke in people with AFib (atrial fibrillation). It belongs to a group of medications called blood thinners. This medicine may be used for other purposes; ask your health care provider or pharmacist if you have questions. COMMON BRAND NAME(S): Eliquis What should I tell my care team before I take this medication? They need to know if you have any of these conditions: Antiphospholipid antibody syndrome Bleeding disorder History of bleeding in the brain History of blood clots History of stomach bleeding Kidney disease Liver disease Mechanical heart valve Spinal surgery An unusual or allergic reaction to apixaban, other medications, foods, dyes, or preservatives Pregnant or trying to get pregnant Breast-feeding How should I use this medication? Take this medication by mouth. For your therapy to work as well as possible, take each dose exactly as prescribed on the prescription label. Do not skip doses. Skipping doses or stopping this medication can increase your risk of a blood clot or stroke. Keep taking this medication unless your care team tells you to stop. Take it as directed on the prescription  label at the same time every day. You can take it with or without food. If it upsets your stomach, take it with food. A special MedGuide will be given to you by the pharmacist with each prescription and refill. Be sure to read this information carefully each time. Talk to your care team about the use of this medication in children. Special care may be needed. Overdosage: If you think you have taken too much of this medicine contact a  poison control center or emergency room at once. NOTE: This medicine is only for you. Do not share this medicine with others. What if I miss a dose? If you miss a dose, take it as soon as you can. If it is almost time for your next dose, take only that dose. Do not take double or extra doses. What may interact with this medication? This medication may interact with the following: Aspirin and aspirin-like medications Certain medications for fungal infections like itraconazole and ketoconazole Certain medications for seizures like carbamazepine and phenytoin Certain medications for blood clots like enoxaparin, dalteparin, heparin, and warfarin Clarithromycin NSAIDs, medications for pain and inflammation, like ibuprofen or naproxen Rifampin Ritonavir St. John's wort This list may not describe all possible interactions. Give your health care provider a list of all the medicines, herbs, non-prescription drugs, or dietary supplements you use. Also tell them if you smoke, drink alcohol, or use illegal drugs. Some items may interact with your medicine. What should I watch for while using this medication? Visit your healthcare professional for regular checks on your progress. You may need blood work done while you are taking this medication. Your condition will be monitored carefully while you are receiving this medication. It is important not to miss any appointments. Avoid sports and activities that might cause injury while you are using this medication. Severe falls or injuries can cause unseen bleeding. Be careful when using sharp tools or knives. Consider using an Copy. Take special care brushing or flossing your teeth. Report any injuries, bruising, or red spots on the skin to your healthcare professional. If you are going to need surgery or other procedure, tell your healthcare professional that you are taking this medication. Wear a medical ID bracelet or chain. Carry a card that  describes your disease and details of your medication and dosage times. What side effects may I notice from receiving this medication? Side effects that you should report to your care team as soon as possible: Allergic reactions--skin rash, itching, hives, swelling of the face, lips, tongue, or throat Bleeding--bloody or black, tar-like stools, vomiting blood or brown material that looks like coffee grounds, red or dark brown urine, small red or purple spots on the skin, unusual bruising or bleeding Bleeding in the brain--severe headache, stiff neck, confusion, dizziness, change in vision, numbness or weakness of the face, arm, or leg, trouble speaking, trouble walking, vomiting Heavy periods This list may not describe all possible side effects. Call your doctor for medical advice about side effects. You may report side effects to FDA at 1-800-FDA-1088. Where should I keep my medication? Keep out of the reach of children and pets. Store at room temperature between 20 and 25 degrees C (68 and 77 degrees F). Get rid of any unused medication after the expiration date. To get rid of medications that are no longer needed or expired: Take the medication to a medication take-back program. Check with your pharmacy or law enforcement to find a location. If you cannot return the  medication, check the label or package insert to see if the medication should be thrown out in the garbage or flushed down the toilet. If you are not sure, ask your care team. If it is safe to put in the trash, empty the medication out of the container. Mix the medication with cat litter, dirt, coffee grounds, or other unwanted substance. Seal the mixture in a bag or container. Put it in the trash. NOTE: This sheet is a summary. It may not cover all possible information. If you have questions about this medicine, talk to your doctor, pharmacist, or health care provider.  2023 Elsevier/Gold Standard (2020-11-14 00:00:00)   Important  Information About Sugar

## 2022-03-31 NOTE — Progress Notes (Signed)
Cardiology Office Note:    Date:  03/31/2022   ID:  Kevin Esparza, DOB [birth year], MRN [id number]  PCP:  Ranae Plumber, [facility] Cardiologist:  Dr. Or[facility] Electrophysiologist:  None   Referring MD: Ranae Plumber, PA   Chief Complaint: Hospital follow-up  History of Present Illness:    Kevin Esparza is a 61 y.o. male with a hx of HTN, DM2, HLD, ED, neuroendocrine carcinoma of the stomach, GERD, and marijuana use who is being seen for hospital follow-up.   He was recently admitted for chest pain. HS trop was negative. CT scan showed coronary artery calcification. CRP and sed rate were up. Myoview was nonischemic. Echo showed small circumferential pericardial effusion with no tamponade. He was started on Ibuprofen and colchicine for pericarditis.   Today, EKG shows he is in afib with rates 107bpm. He feels dizzy at times and he feels his heart racing. Says palpitations Afib is a new diagnosis, this was discussed in detail. He denies chest pain or SOB. NO LLE, orthopnea, or pnd. He reports compliance with ibuprofen and colchicine. He says ibuprofen makes him sweat.   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   Pneumonia    several times    Past Surgical History:  Procedure Laterality Date   APPENDECTOMY  2005   BIOPSY  03/19/2021   Procedure: BIOPSY;  Surgeon: Irving Copas., MD;  Location: [place];  Service: Gastroenterology;;   [place]   c4-c7   CHOLECYSTECTOMY  2005   COLONOSCOPY WITH PROPOFOL N/A 12/31/2020   Procedure: COLONOSCOPY WITH  PROPOFOL;  Surgeon: Virgel Manifold, MD;  Location: [facility];  Service: Endoscopy;  Laterality: N/A;   COLONOSCOPY WITH PROPOFOL N/A 03/19/2021   Procedure: COLONOSCOPY WITH PROPOFOL;  Surgeon: Rush Landmark Telford Nab., MD;  Location: [place];  Service: Gastroenterology;  Laterality: N/A;   ENDOSCOPIC MUCOSAL RESECTION  03/19/2021   Procedure: ENDOSCOPIC MUCOSAL RESECTION;  Surgeon: Rush Landmark Telford Nab., MD;  Location: [place];  Service: Gastroenterology;;   ESOPHAGOGASTRODUODENOSCOPY (EGD) WITH PROPOFOL N/A 12/31/2020   Procedure: ESOPHAGOGASTRODUODENOSCOPY (EGD) WITH PROPOFOL;  Surgeon: Virgel Manifold, MD;  Location: [facility];  Service: Endoscopy;  Laterality: N/A;   ESOPHAGOGASTRODUODENOSCOPY (EGD) WITH PROPOFOL N/A 03/19/2021   Procedure: ESOPHAGOGASTRODUODENOSCOPY (EGD) WITH PROPOFOL;  Surgeon: Rush Landmark Telford Nab., MD;  Location: [place];  Service: Gastroenterology;  Laterality: N/A;   HEMOSTASIS CLIP PLACEMENT  03/19/2021   Procedure: HEMOSTASIS CLIP PLACEMENT;  Surgeon: Irving Copas., MD;  Location: [place];  Service: Gastroenterology;;   MULTIPLE TOOTH EXTRACTIONS     dentures   POLYPECTOMY  03/19/2021   Procedure: POLYPECTOMY;  Surgeon: Irving Copas., MD;  Location: [place];  Service: Gastroenterology;;   SUBMUCOSAL LIFTING INJECTION  03/19/2021   Procedure: SUBMUCOSAL LIFTING INJECTION;  Surgeon: Irving Copas., MD;  Location: [place];  Service: Gastroenterology;;   TONSILLECTOMY     UPPER ESOPHAGEAL ENDOSCOPIC ULTRASOUND (EUS) N/A 03/19/2021   Procedure: UPPER ESOPHAGEAL ENDOSCOPIC ULTRASOUND (EUS);  Surgeon:  Mansouraty, Telford Nab., MD;  Location: [place];  Service: Gastroenterology;  Laterality: N/A;    Current Medications: Current Meds  Medication Sig   albuterol (VENTOLIN HFA) 108 (90 Base) MCG/ACT inhaler Inhale 2 puffs into the lungs every 6 (six) hours as needed for wheezing or shortness of  breath.   apixaban (ELIQUIS) 5 MG TABS tablet Take 1 tablet (5 mg total) by mouth 2 (two) times daily.   cyclobenzaprine (FLEXERIL) 10 MG tablet Take 10 mg by mouth 3 (three) times daily as needed for muscle spasms.   diclofenac Sodium (VOLTAREN) 1 % GEL Apply 2 g topically daily as needed (pain).   enalapril (VASOTEC) 2.5 MG tablet Take 5 mg by mouth daily.   gabapentin (NEURONTIN) 300 MG capsule Take 300 mg by mouth at bedtime.   metFORMIN (GLUCOPHAGE) 1000 MG tablet Take 1,000 mg by mouth daily with breakfast.   metoprolol succinate (TOPROL-XL) 50 MG 24 hr tablet Take 2 tablets (100 mg) by mouth once daily. Take with or immediately following a meal.    omeprazole (PRILOSEC) 20 MG capsule Take 20 mg by mouth daily.   [DISCONTINUED] amLODipine (NORVASC) 10 MG tablet Take 10 mg by mouth daily.   [DISCONTINUED] ibuprofen (ADVIL) 800 MG tablet Take 1 tablet (800 mg total) by mouth 3 (three) times daily for 8 days.     Allergies:   Patient has no known allergies.   Social History   Socioeconomic History   Marital status: Married    Spouse name: Not on file   Number of children: 3   Years of education: Not on file   Highest education level: Not on file  Occupational History   Not on file  Tobacco Use   Smoking status: Former    Years: 14.00    Types: Cigarettes    Quit date: 03/05/2004    Years since quitting: 18.0   Smokeless tobacco: Current    Types: Chew  Vaping Use   Vaping Use: Never used  Substance and Sexual Activity   Alcohol use: Yes    Alcohol/week: 1.0 standard drink    Types: 1 Cans of beer per week    Comment: Occasionally   Drug use: Yes    Frequency: 1.0 times per week    Types: Marijuana   Sexual activity: Yes  Other Topics Concern   Not on file  Social History Narrative   Not on file   Social Determinants of Health   Financial Resource Strain: Not on file  Food Insecurity: Not on file  Transportation Needs: Not on file  Physical Activity: Not on file   Stress: Not on file  Social Connections: Not on file     Family History: The patient's family history includes Cancer in his father; Diabetes in his mother; Hypertension in his mother; Kidney cancer in his maternal grandmother; Liver cancer in his maternal grandmother; Lung cancer in his maternal grandfather; Osteoarthritis in his mother; Osteoporosis in his mother; Stroke in his father. There is no history of Kidney disease, Prostate cancer, Bladder Cancer, Colon cancer, Esophageal cancer, Inflammatory bowel disease, Pancreatic cancer, Rectal cancer, or Stomach cancer.  ROS:   Please see the history of present illness.     All other systems reviewed and are negative.  EKGs/Labs/Other Studies Reviewed:    The following studies were reviewed today:  Echo 03/2022  1. Left ventricular ejection fraction, by estimation, is 60 to 65%. The  left ventricle has normal function. The left ventricle has no regional  wall motion abnormalities. Left ventricular diastolic parameters are  consistent with Grade I diastolic  dysfunction (impaired relaxation).   2. Right ventricular systolic function is normal. The right ventricular  size is normal. There is normal pulmonary artery systolic pressure. The  estimated right ventricular systolic pressure is 96.0 mmHg.   3. A small pericardial effusion is present. Estimated 0.8 cm or less  circumferential. There is no evidence of cardiac tamponade.   4. The mitral valve is normal in structure. No evidence of mitral valve  regurgitation. No evidence of mitral stenosis.   5. The aortic valve is tricuspid. Aortic valve regurgitation is not  visualized. No aortic stenosis is present.   6. The inferior vena cava is normal in size with greater than 50%  respiratory variability, suggesting right atrial pressure of 3 mmHg.   Myoview lexiscan 03/2022 Narrative & Impression  Pharmacological myocardial perfusion imaging study with no significant  ischemia Normal  wall motion, EF estimated at 68% No EKG changes concerning for ischemia at peak stress or in recovery. Resting EKG with nonspecific ST abnormality V3 through V6, 2, 3, aVF CT attenuation correction images with mild aortic atherosclerosis, no significant coronary calcification Low risk scan    EKG:  EKG is  ordered today.  The ekg ordered today demonstrates Afib RVR, 107bpm, nonspecific T wave changes  Recent Labs: 03/24/2022: ALT 23; BUN 6; Creatinine, Ser 1.14; Potassium 3.5; Sodium 135; TSH 3.354 03/25/2022: Hemoglobin 12.2; Platelets 157  Recent Lipid Panel    Component Value Date/Time   CHOL 133 03/25/2022 0147   TRIG 93 03/25/2022 0147   HDL 38 (L) 03/25/2022 0147   CHOLHDL 3.5 03/25/2022 0147   VLDL 19 03/25/2022 0147   LDLCALC 76 03/25/2022 0147     Physical Exam:    VS:  BP 100/60 (BP Location: Left Arm, Patient Position: Sitting, Cuff Size: Normal)   Pulse (!) 107   Ht '5\' 10"'$  (1.778 m)   Wt 178 lb 2 oz (80.8 kg)   SpO2 98%   BMI 25.56 kg/m     Wt Readings from Last 3 Encounters:  03/31/22 178 lb 2 oz (80.8 kg)  03/24/22 180 lb (81.6 kg)  03/16/22 179 lb 1.6 oz (81.2 kg)     GEN:  Well nourished, well developed in no acute distress HEENT: Normal NECK: No JVD; No carotid bruits LYMPHATICS: No lymphadenopathy CARDIAC: Irreg Irreg, no murmurs, rubs, gallops RESPIRATORY:  Clear to auscultation without rales, wheezing or rhonchi  ABDOMEN: Soft, non-tender, non-distended MUSCULOSKELETAL:  No edema; No deformity  SKIN: Warm and dry NEUROLOGIC:  Alert and oriented x 3 PSYCHIATRIC:  Normal affect   ASSESSMENT:    1. New onset atrial fibrillation ([place])   2. Primary hypertension   3. Mixed hyperlipidemia   4. Pericarditis, unspecified chronicity, unspecified type    PLAN:    In order of problems listed above:  New Afib RVR EKG showed New Afib RVR with rates 107bpm. He is on Toprol '50mg'$  daily. He feels minimally symptomatic, but overall looks comfortable. I  will stop amlodipidne and increase toprol to '100mg'$  daily. I will check BMET, CBC, Mag, TSH. He has a CHADSVASC of at least 3(HTN, DM2, PAD) so I will start Eliquis '5mg'$  BID, may need samples and assitance program given he has no insurance. I will see him back next week. He may need TEE/DCCV if he is still in Afib at that time. Also instructed to go to the ER if symptoms worsen.  Pericarditis Recent admission for chest pain with negative troponin. Myoview was nonischemic. Echo showed normal LVEF with small pericardial effusion. CRP and Sed rate were elevated and he was started on ibuprofen and colchicine. Patient denies further chest pain. I will stop ibuprofen with the initiation of Eliquis. Continue colchicine 0.'6mg'$ BID for 3 months and Protonix. He will need repeat echo to evaluate effusion, can order at follow-up.   HTN BP is borderline. Stop amlodipine for room to titrate Toprol. Continue Enalapril. Increase Toprol as above.   HLD LDL 75, would likely benefit from statin, especially with diabetes.   Disposition: Follow up in 1 month(s) with MD/APP     Signed, Arley Salamone Ninfa Meeker, PA-C  03/31/2022 12:59 PM    [facility]

## 2022-04-02 ENCOUNTER — Other Ambulatory Visit
Admission: RE | Admit: 2022-04-02 | Discharge: 2022-04-02 | Disposition: A | Discharge status: Home / Self Care | Payer: Medicare Other | Attending: Medical | Admitting: Medical

## 2022-04-02 DIAGNOSIS — I4891 Unspecified atrial fibrillation: Secondary | ICD-10-CM | POA: Diagnosis present

## 2022-04-02 LAB — BASIC METABOLIC PANEL
Anion gap: 9 (ref 5–15)
BUN: 8 mg/dL (ref 8–23)
CO2: 27 mmol/L (ref 22–32)
Calcium: 8.9 mg/dL (ref 8.9–10.3)
Chloride: 100 mmol/L (ref 98–111)
Creatinine, Ser: 1.03 mg/dL (ref 0.61–1.24)
GFR, Estimated: >60 mL/min (ref >60)
Glucose, Bld: 146 mg/dL — ABNORMAL HIGH (ref 70–99)
Potassium: 3.7 mmol/L (ref 3.5–5.1)
Sodium: 136 mmol/L (ref 135–145)

## 2022-04-02 LAB — CBC
HCT: 40.3 % (ref 39.0–52.0)
Hemoglobin: 13.3 g/dL (ref 13.0–17.0)
MCH: 27.9 pg (ref 26.0–34.0)
MCHC: 33 g/dL (ref 30.0–36.0)
MCV: 84.7 fL (ref 80.0–100.0)
Platelets: 293 10*3/uL (ref 150–400)
RBC: 4.76 MIL/uL (ref 4.22–5.81)
RDW: 12.6 % (ref 11.5–15.5)
WBC: 9.1 10*3/uL (ref 4.0–10.5)
nRBC: 0 % (ref 0.0–0.2)

## 2022-04-02 LAB — MAGNESIUM: Magnesium: 1.5 mg/dL — ABNORMAL LOW (ref 1.7–2.4)

## 2022-04-02 LAB — TSH: TSH: 2.099 u[IU]/mL (ref 0.350–4.500)

## 2022-04-05 ENCOUNTER — Telehealth: Payer: Self-pay

## 2022-04-05 MED ORDER — MAGNESIUM OXIDE -MG SUPPLEMENT 500 MG PO CAPS: 500.0000 mg | ORAL_CAPSULE | Freq: Two times a day (BID) | ORAL | 2 refills | Status: DC

## 2022-04-05 NOTE — Telephone Encounter (Signed)
-----   Message from [place], PA-C sent at 04/05/2022  4:22 PM EDT ----- Overall labs are OK, Mag is low. Lets do Mag-oxide '500mg'$  BID for 3 months, recheck mag in 3 months.

## 2022-04-05 NOTE — Telephone Encounter (Signed)
The patient has been notified of the result and verbalized understanding.  All questions (if any) were answered. Kavin Leech, RN 04/05/2022 5:11 PM

## 2022-04-07 ENCOUNTER — Encounter: Payer: Self-pay | Admitting: Medical

## 2022-04-07 ENCOUNTER — Ambulatory Visit (INDEPENDENT_AMBULATORY_CARE_PROVIDER_SITE_OTHER): Payer: Medicare Other | Admitting: Medical

## 2022-04-07 VITALS — BP 110/78 | HR 85 | Ht 70.0 in | Wt 176.5 lb

## 2022-04-07 DIAGNOSIS — E782 Mixed hyperlipidemia: Secondary | ICD-10-CM

## 2022-04-07 DIAGNOSIS — I319 Disease of pericardium, unspecified: Secondary | ICD-10-CM | POA: Diagnosis not present

## 2022-04-07 DIAGNOSIS — I480 Paroxysmal atrial fibrillation: Secondary | ICD-10-CM

## 2022-04-07 DIAGNOSIS — I3139 Other pericardial effusion (noninflammatory): Secondary | ICD-10-CM

## 2022-04-07 DIAGNOSIS — I10 Essential (primary) hypertension: Secondary | ICD-10-CM

## 2022-04-07 MED ORDER — IBUPROFEN 800 MG PO TABS
ORAL_TABLET | ORAL | Status: DC
Start: 2022-04-07 — End: 2022-06-27

## 2022-04-07 NOTE — Patient Instructions (Addendum)
Medication Instructions:  - Your physician has recommended you make the following change in your medication:   1) START Ibuprofen 800 mg 3 times a day  We will be sending in your application today to [employer] to try to get you free Eliquis (blood thinner) from the company  *If you need a refill on your cardiac medications before your next appointment, please call your pharmacy*   Lab Work: - none ordered  If you have labs (blood work) drawn today and your tests are completely normal, you will receive your results only by: [place] (if you have MyChart) OR A paper copy in the mail If you have any lab test that is abnormal or we need to change your treatment, we will call you to review the results.   Testing/Procedures:  1) Limited Echocardigram: (the last week of June).  - Your physician has requested that you have a Limited echocardiogram. Echocardiography is a painless test that uses sound waves to create images of your heart. It provides your doctor with information about the size and shape of your heart and how well your heart's chambers and valves are working. This procedure takes approximately one hour. There are no restrictions for this procedure. There is a possibility that an IV may need to be started during your test to inject an image enhancing agent. This is done to obtain more optimal pictures of your heart. Therefore we ask that you do at least drink some water prior to coming in to hydrate your veins.    Follow-Up: At [facility], you and your health needs are our priority.  As part of our continuing mission to provide you with exceptional heart care, we have created designated Provider Care Teams.  These Care Teams include your primary Cardiologist (physician) and Advanced Practice Providers (APPs -  Physician Assistants and Nurse Practitioners) who all work together to provide you with the care you need, when you need it.  We recommend signing up for the  patient portal called "MyChart".  Sign up information is provided on this After Visit Summary.  MyChart is used to connect with patients for Virtual Visits (Telemedicine).  Patients are able to view lab/test results, encounter notes, upcoming appointments, etc.  Non-urgent messages can be sent to your provider as well.   To learn more about what you can do with MyChart, go to [email or web address].    Your next appointment:   2 month(s)  The format for your next appointment:   In Person  Provider:   You may see Kate Sable, MD  or one of the following Advanced Practice Providers on your designated Care Team:    Cadence Kathlen Mody, [place]    Other Instructions  Echocardiogram An echocardiogram is a test that uses sound waves (ultrasound) to produce images of the heart. Images from an echocardiogram can provide important information about: Heart size and shape. The size and thickness and movement of your heart's walls. Heart muscle function and strength. Heart valve function or if you have stenosis. Stenosis is when the heart valves are too narrow. If blood is flowing backward through the heart valves (regurgitation). A tumor or infectious growth around the heart valves. Areas of heart muscle that are not working well because of poor blood flow or injury from a heart attack. Aneurysm detection. An aneurysm is a weak or damaged part of an artery wall. The wall bulges out from the normal force of blood pumping through the body. Tell a health care provider  about: Any allergies you have. All medicines you are taking, including vitamins, herbs, eye drops, creams, and over-the-counter medicines. Any blood disorders you have. Any surgeries you have had. Any medical conditions you have. Whether you are pregnant or may be pregnant. What are the risks? Generally, this is a safe test. However, problems may occur, including an allergic reaction to dye (contrast) that may be used during the  test. What happens before the test? No specific preparation is needed. You may eat and drink normally. What happens during the test?  You will take off your clothes from the waist up and put on a hospital gown. Electrodes or electrocardiogram (ECG)patches may be placed on your chest. The electrodes or patches are then connected to a device that monitors your heart rate and rhythm. You will lie down on a table for an ultrasound exam. A gel will be applied to your chest to help sound waves pass through your skin. A handheld device, called a transducer, will be pressed against your chest and moved over your heart. The transducer produces sound waves that travel to your heart and bounce back (or "echo" back) to the transducer. These sound waves will be captured in real-time and changed into images of your heart that can be viewed on a video monitor. The images will be recorded on a computer and reviewed by your health care provider. You may be asked to change positions or hold your breath for a short time. This makes it easier to get different views or better views of your heart. In some cases, you may receive contrast through an IV in one of your veins. This can improve the quality of the pictures from your heart. The procedure may vary among health care providers and hospitals. What can I expect after the test? You may return to your normal, everyday life, including diet, activities, and medicines, unless your health care provider tells you not to do that. Follow these instructions at home: It is up to you to get the results of your test. Ask your health care provider, or the department that is doing the test, when your results will be ready. Keep all follow-up visits. This is important. Summary An echocardiogram is a test that uses sound waves (ultrasound) to produce images of the heart. Images from an echocardiogram can provide important information about the size and shape of your heart, heart  muscle function, heart valve function, and other possible heart problems. You do not need to do anything to prepare before this test. You may eat and drink normally. After the echocardiogram is completed, you may return to your normal, everyday life, unless your health care provider tells you not to do that. This information is not intended to replace advice given to you by your health care provider. Make sure you discuss any questions you have with your health care provider. Document Revised: 07/01/2021 Document Reviewed: 06/10/2020 Elsevier Patient Education  [place]

## 2022-04-07 NOTE — Progress Notes (Signed)
Cardiology Office Note:    Date:  04/07/2022   ID:  Kevin Esparza, DOB 02/29/1961, MRN [id number]  PCP:  Ranae Plumber, [facility] Cardiologist:  Dr. [facility] Electrophysiologist:  None   Referring MD: Ranae Plumber, PA   Chief Complaint: 1 week follow-up  History of Present Illness:    Kevin Esparza is a 61 y.o. male with a hx of HTN, DM2, HLD, ED, neuroendocrine carcinoma of the stomach, GERD, marijuana, and recently diagnosed Afib use who is being seen for hospital follow-up.    He was recently admitted for chest pain. HS trop was negative. CT scan showed coronary artery calcification. CRP and sed rate were up. Myoview was nonischemic. Echo showed small circumferential pericardial effusion with no tamponade. He was started on Ibuprofen and colchicine for pericarditis.   He was seen 5/31 and was in [place] onset Afib. He was started on Eliquis. Amlodipine was stopped and Toprol was increased to '100mg'$  daily. Labs were drawn and he was set up for close follow-up.   Today, the patient is is normal rhythm with heart rate in the 80s. He took colchicine for 10 days and said once he stopped it he immediately felt better, like he was in back in normal rhythm. This occurred 2 days ago. No chest pain or SOB. No LLE, orthopnea, or pnd. He is willing to take ibuprofen '800mg'$  TID. He will double up on his PPI. He reports compliance with Eliquis, no bleeding issues. He brought in the medication assistance papers for Eliquis.   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   Pneumonia    several times     Past Surgical History:  Procedure Laterality Date   APPENDECTOMY  2005   BIOPSY  03/19/2021   Procedure: BIOPSY;  Surgeon: Irving Copas., MD;  Location: [place];  Service: Gastroenterology;;   [place]   c4-c7   CHOLECYSTECTOMY  2005   COLONOSCOPY WITH PROPOFOL N/A 12/31/2020   Procedure: COLONOSCOPY WITH PROPOFOL;  Surgeon: Virgel Manifold, MD;  Location: [facility];  Service: Endoscopy;  Laterality: N/A;   COLONOSCOPY WITH PROPOFOL N/A 03/19/2021   Procedure: COLONOSCOPY WITH PROPOFOL;  Surgeon: Rush Landmark Telford Nab., MD;  Location: [place];  Service: Gastroenterology;  Laterality: N/A;   ENDOSCOPIC MUCOSAL RESECTION  03/19/2021   Procedure: ENDOSCOPIC MUCOSAL RESECTION;  Surgeon: Rush Landmark Telford Nab., MD;  Location: [place];  Service: Gastroenterology;;   ESOPHAGOGASTRODUODENOSCOPY (EGD) WITH PROPOFOL N/A 12/31/2020   Procedure: ESOPHAGOGASTRODUODENOSCOPY (EGD) WITH PROPOFOL;  Surgeon: Virgel Manifold, MD;  Location: [facility];  Service: Endoscopy;  Laterality: N/A;   ESOPHAGOGASTRODUODENOSCOPY (EGD) WITH PROPOFOL N/A 03/19/2021   Procedure: ESOPHAGOGASTRODUODENOSCOPY (EGD) WITH PROPOFOL;  Surgeon: Rush Landmark Telford Nab., MD;  Location: [place];  Service: Gastroenterology;  Laterality: N/A;   HEMOSTASIS CLIP PLACEMENT  03/19/2021   Procedure: HEMOSTASIS CLIP PLACEMENT;  Surgeon: Irving Copas., MD;  Location: [place];  Service: Gastroenterology;;   MULTIPLE TOOTH EXTRACTIONS     dentures  POLYPECTOMY  03/19/2021   Procedure: POLYPECTOMY;  Surgeon: Mansouraty, Telford Nab., MD;  Location: [facility] [place];  Service: Gastroenterology;;   SUBMUCOSAL LIFTING INJECTION  03/19/2021   Procedure: SUBMUCOSAL LIFTING INJECTION;  Surgeon: Irving Copas., MD;  Location: [place];  Service: Gastroenterology;;   TONSILLECTOMY     UPPER ESOPHAGEAL ENDOSCOPIC ULTRASOUND (EUS) N/A 03/19/2021   Procedure: UPPER  ESOPHAGEAL ENDOSCOPIC ULTRASOUND (EUS);  Surgeon: Irving Copas., MD;  Location: [place];  Service: Gastroenterology;  Laterality: N/A;    Current Medications: Current Meds  Medication Sig   albuterol (VENTOLIN HFA) 108 (90 Base) MCG/ACT inhaler Inhale 2 puffs into the lungs every 6 (six) hours as needed for wheezing or shortness of breath.   apixaban (ELIQUIS) 5 MG TABS tablet Take 1 tablet (5 mg total) by mouth 2 (two) times daily.   cyclobenzaprine (FLEXERIL) 10 MG tablet Take 10 mg by mouth 3 (three) times daily as needed for muscle spasms.   diclofenac Sodium (VOLTAREN) 1 % GEL Apply 2 g topically daily as needed (pain).   enalapril (VASOTEC) 2.5 MG tablet Take 5 mg by mouth daily.   gabapentin (NEURONTIN) 300 MG capsule Take 300 mg by mouth at bedtime.   ibuprofen (ADVIL) 800 MG tablet Take 1 tablet (800 mg) by mouth once daily   Magnesium Oxide 500 MG CAPS Take 1 capsule (500 mg total) by mouth 2 (two) times daily.   metFORMIN (GLUCOPHAGE) 1000 MG tablet Take 1,000 mg by mouth daily with breakfast.   metoprolol succinate (TOPROL-XL) 50 MG 24 hr tablet Take 2 tablets (100 mg) by mouth once daily. Take with or immediately following a meal.    omeprazole (PRILOSEC) 20 MG capsule Take 20 mg by mouth daily.   tamsulosin (FLOMAX) 0.4 MG CAPS capsule Take 1 capsule (0.4 mg total) by mouth daily.     Allergies:   Patient has no known allergies.   Social History   Socioeconomic History   Marital status: Married    Spouse name: Not on file   Number of children: 3   Years of education: Not on file   Highest education level: Not on file  Occupational History   Not on file  Tobacco Use   Smoking status: Former    Years: 14.00    Types: Cigarettes    Quit date: 03/05/2004    Years since quitting: 18.1   Smokeless tobacco: Current    Types: Chew  Vaping Use   Vaping Use: Never used  Substance and Sexual Activity   Alcohol use: Yes    Alcohol/week: 1.0 standard drink     Types: 1 Cans of beer per week    Comment: Occasionally   Drug use: Yes    Frequency: 1.0 times per week    Types: Marijuana   Sexual activity: Yes  Other Topics Concern   Not on file  Social History Narrative   Not on file   Social Determinants of Health   Financial Resource Strain: Not on file  Food Insecurity: Not on file  Transportation Needs: Not on file  Physical Activity: Not on file  Stress: Not on file  Social Connections: Not on file     Family History: The patient's family history includes Cancer in his father; Diabetes in his mother; Hypertension in his mother; Kidney cancer in his maternal grandmother; Liver cancer in his maternal grandmother; Lung cancer in his maternal grandfather; Osteoarthritis in his mother; Osteoporosis in his mother; Stroke in his father. There is no  history of Kidney disease, Prostate cancer, Bladder Cancer, Colon cancer, Esophageal cancer, Inflammatory bowel disease, Pancreatic cancer, Rectal cancer, or Stomach cancer.  ROS:   Please see the history of present illness.     All other systems reviewed and are negative.  EKGs/Labs/Other Studies Reviewed:    The following studies were reviewed today:    Echo 03/2022  1. Left ventricular ejection fraction, by estimation, is 60 to 65%. The  left ventricle has normal function. The left ventricle has no regional  wall motion abnormalities. Left ventricular diastolic parameters are  consistent with Grade I diastolic  dysfunction (impaired relaxation).   2. Right ventricular systolic function is normal. The right ventricular  size is normal. There is normal pulmonary artery systolic pressure. The  estimated right ventricular systolic pressure is 50.9 mmHg.   3. A small pericardial effusion is present. Estimated 0.8 cm or less  circumferential. There is no evidence of cardiac tamponade.   4. The mitral valve is normal in structure. No evidence of mitral valve  regurgitation. No evidence of  mitral stenosis.   5. The aortic valve is tricuspid. Aortic valve regurgitation is not  visualized. No aortic stenosis is present.   6. The inferior vena cava is normal in size with greater than 50%  respiratory variability, suggesting right atrial pressure of 3 mmHg.    Myoview lexiscan 03/2022 Narrative & Impression  Pharmacological myocardial perfusion imaging study with no significant  ischemia Normal wall motion, EF estimated at 68% No EKG changes concerning for ischemia at peak stress or in recovery. Resting EKG with nonspecific ST abnormality V3 through V6, 2, 3, aVF CT attenuation correction images with mild aortic atherosclerosis, no significant coronary calcification Low risk scan    EKG:  EKG is  ordered today.  The ekg ordered today demonstrates NSR, 85bpm, nonspecific T wave changes  Recent Labs: 03/24/2022: ALT 23 04/02/2022: BUN 8; Creatinine, Ser 1.03; Hemoglobin 13.3; Magnesium 1.5; Platelets 293; Potassium 3.7; Sodium 136; TSH 2.099  Recent Lipid Panel    Component Value Date/Time   CHOL 133 03/25/2022 0147   TRIG 93 03/25/2022 0147   HDL 38 (L) 03/25/2022 0147   CHOLHDL 3.5 03/25/2022 0147   VLDL 19 03/25/2022 0147   LDLCALC 76 03/25/2022 0147    Physical Exam:    VS:  BP 110/78 (BP Location: Left Arm, Patient Position: Sitting, Cuff Size: Normal)   Pulse 85   Ht '5\' 10"'$  (1.778 m)   Wt 176 lb 8 oz (80.1 kg)   SpO2 98%   BMI 25.33 kg/m     Wt Readings from Last 3 Encounters:  04/07/22 176 lb 8 oz (80.1 kg)  03/31/22 178 lb 2 oz (80.8 kg)  03/24/22 180 lb (81.6 kg)     GEN:  Well nourished, well developed in no acute distress HEENT: Normal NECK: No JVD; No carotid bruits LYMPHATICS: No lymphadenopathy CARDIAC: RRR, no murmurs, rubs, gallops RESPIRATORY:  Clear to auscultation without rales, wheezing or rhonchi  ABDOMEN: Soft, non-tender, non-distended MUSCULOSKELETAL:  No edema; No deformity  SKIN: Warm and dry NEUROLOGIC:  Alert and oriented x  3 PSYCHIATRIC:  Normal affect   ASSESSMENT:    1. Paroxysmal A-fib ([place])   2. Pericarditis, unspecified chronicity, unspecified type   3. Essential hypertension   4. Hyperlipidemia, mixed    PLAN:    In order of problems listed above:  Paroxysmal Afib RVR He is back in NSR today with heart rate of 85bpm. He is feeling  overall much better. He felt once he stopped the colchicine his heart went back into normal rhythm (this was 2 days ago). He will take oral Mag-oxide for low Magnesium on last blood draw. He taking Eliquis '5mg'$  BID, patient assistance program papers will be faxed today. Continue Toprol '100mg'$  daily for rate control.   Pericarditis Patient reported he ran out of colchicine and not wanting to re-start it because it made him feel terrible. We will restart Ibuprofen '800mg'$  TID, plan to take for 3 months. He will double up on his PPI. We will re-check a limited echo in 2 weeks to assess small pericardial effusion.   HTN BP is good today. Continue current medications.   HLD He reports intolerance to statins.   Disposition: Follow up in 2 month(s) with MD/APP     Signed, Aleksandra Raben Ninfa Meeker, PA-C  04/07/2022 3:01 PM    [facility]

## 2022-04-08 ENCOUNTER — Telehealth: Payer: Self-pay | Admitting: Medical

## 2022-04-08 NOTE — Telephone Encounter (Signed)
Completed patient assistance application for Eliquis faxed to [place].  Fax: [phone]. Confirmation received.   Original application placed in file cabinet.

## 2022-04-14 NOTE — Telephone Encounter (Signed)
Fax received from [employer] requesting the collaborating Physicians name and NPI. Updated the form adding [facility] and faxed back to [employer]. Fax confirmation received.

## 2022-04-20 NOTE — Telephone Encounter (Signed)
Approval letter received from [employer]. Patient assistance application for Eliquis has been approved from 04/15/22-04/15/23.

## 2022-04-30 ENCOUNTER — Other Ambulatory Visit: Payer: Medicare Other

## 2022-05-06 ENCOUNTER — Telehealth: Payer: Self-pay

## 2022-05-06 ENCOUNTER — Encounter (HOSPITAL_COMMUNITY): Payer: Self-pay | Admitting: Gastroenterology

## 2022-05-06 DIAGNOSIS — I4891 Unspecified atrial fibrillation: Secondary | ICD-10-CM | POA: Insufficient documentation

## 2022-05-06 DIAGNOSIS — I2510 Atherosclerotic heart disease of native coronary artery without angina pectoris: Secondary | ICD-10-CM

## 2022-05-06 NOTE — Telephone Encounter (Signed)
Patient with diagnosis of afib on Eliquis for anticoagulation.    Procedure: colonoscopy, endoscopy Date of procedure: 05/13/22  CHA2DS2-VASc Score = 4  This indicates a 4.8% annual risk of stroke. The patient's score is based upon: CHF History: 1 HTN History: 1 Diabetes History: 1 Stroke History: 0 Vascular Disease History: 1 Age Score: 0 Gender Score: 0   CrCl 67m/min Platelet count 293K  Per office protocol, patient can hold Eliquis for 1-2 days prior to procedure.    **This guidance is not considered finalized until pre-operative APP has relayed final recommendations.**

## 2022-05-06 NOTE — Progress Notes (Signed)
Attempted to obtain medical history via telephone, unable to reach at this time. HIPAA compliant voicemail message left requesting return call to pre surgical testing department. 

## 2022-05-06 NOTE — Telephone Encounter (Signed)
Pt recently put on Eliquis and has a colon, EGD EMR scheduled for 7/13.     [facility] Pre-operative Risk Assessment     Request for surgical clearance:     Endoscopy Procedure  What type of surgery is being performed?    Colon EGD EMR   When is this surgery scheduled?      7/13  What type of clearance is required ?   Pharmacy  Are there any medications that need to be held prior to surgery and how long? Eliquis   Practice name and name of physician performing surgery?      [facility]  What is your office phone and fax number?      Phone- [phone]  Fax346 [phone]  Anesthesia type (None, local, MAC, general) ?       MAC    Please respond ASAP pt appt on 7/13

## 2022-05-06 NOTE — Telephone Encounter (Signed)
   Patient Name: Kevin Esparza  DOB: [birth year] MRN: [id number]  Primary Cardiologist: Ida Rogue, MD  Clinical pharmacists have reviewed the patient's past medical history, labs, and current medications as part of pre-operative protocol coverage.   The following recommendations have been made:  Patient with diagnosis of afib on Eliquis for anticoagulation.     Procedure: colonoscopy, endoscopy Date of procedure: 05/13/22   CHA2DS2-VASc Score = 4  This indicates a 4.8% annual risk of stroke. The patient's score is based upon: CHF History: 1 HTN History: 1 Diabetes History: 1 Stroke History: 0 Vascular Disease History: 1 Age Score: 0 Gender Score: 0   CrCl 41m/min Platelet count 293K   Per office protocol, patient can hold Eliquis for 1-2 days prior to procedure. Please resume Eliquis as soon as possible postprocedure, at the discretion of the surgeon.  I will forward this recommendation to the requesting party via epic fax function.  Please call with any questions.  ELenna Sciara NP 05/06/2022, 5:02 PM

## 2022-05-10 NOTE — Telephone Encounter (Signed)
The pt has been advised of the ok to hold Eliquis 2 days prior to the procedure.   Call placed and message left for the pt to hold and also to return call and confirm that he received the message.    My Chart message also sent

## 2022-05-10 NOTE — Telephone Encounter (Signed)
See 7/10 My Chart message pt responded with confirmation that he saw the Eliquis hold.

## 2022-05-13 ENCOUNTER — Ambulatory Visit (HOSPITAL_BASED_OUTPATIENT_CLINIC_OR_DEPARTMENT_OTHER): Payer: Medicare HMO | Admitting: Anesthesiology

## 2022-05-13 ENCOUNTER — Ambulatory Visit (HOSPITAL_COMMUNITY)
Admission: RE | Admit: 2022-05-13 | Discharge: 2022-05-13 | Disposition: A | Discharge status: Home / Self Care | Payer: Medicare HMO | Attending: Gastroenterology | Admitting: Gastroenterology

## 2022-05-13 ENCOUNTER — Ambulatory Visit (HOSPITAL_COMMUNITY): Payer: Medicare HMO | Admitting: Anesthesiology

## 2022-05-13 ENCOUNTER — Encounter (HOSPITAL_COMMUNITY): Payer: Self-pay | Admitting: Gastroenterology

## 2022-05-13 ENCOUNTER — Encounter (HOSPITAL_COMMUNITY)
Admission: RE | Disposition: A | Discharge status: Home / Self Care | Payer: Self-pay | Source: Home / Self Care | Attending: Gastroenterology

## 2022-05-13 ENCOUNTER — Other Ambulatory Visit: Payer: Self-pay

## 2022-05-13 DIAGNOSIS — D128 Benign neoplasm of rectum: Secondary | ICD-10-CM | POA: Insufficient documentation

## 2022-05-13 DIAGNOSIS — K644 Residual hemorrhoidal skin tags: Secondary | ICD-10-CM | POA: Diagnosis not present

## 2022-05-13 DIAGNOSIS — K222 Esophageal obstruction: Secondary | ICD-10-CM | POA: Insufficient documentation

## 2022-05-13 DIAGNOSIS — K641 Second degree hemorrhoids: Secondary | ICD-10-CM | POA: Diagnosis not present

## 2022-05-13 DIAGNOSIS — I2510 Atherosclerotic heart disease of native coronary artery without angina pectoris: Secondary | ICD-10-CM | POA: Insufficient documentation

## 2022-05-13 DIAGNOSIS — D120 Benign neoplasm of cecum: Secondary | ICD-10-CM | POA: Diagnosis not present

## 2022-05-13 DIAGNOSIS — K317 Polyp of stomach and duodenum: Secondary | ICD-10-CM | POA: Diagnosis not present

## 2022-05-13 DIAGNOSIS — E119 Type 2 diabetes mellitus without complications: Secondary | ICD-10-CM | POA: Insufficient documentation

## 2022-05-13 DIAGNOSIS — K621 Rectal polyp: Secondary | ICD-10-CM

## 2022-05-13 DIAGNOSIS — K635 Polyp of colon: Secondary | ICD-10-CM | POA: Diagnosis not present

## 2022-05-13 DIAGNOSIS — K449 Diaphragmatic hernia without obstruction or gangrene: Secondary | ICD-10-CM

## 2022-05-13 DIAGNOSIS — Z9889 Other specified postprocedural states: Secondary | ICD-10-CM | POA: Diagnosis not present

## 2022-05-13 DIAGNOSIS — I509 Heart failure, unspecified: Secondary | ICD-10-CM | POA: Diagnosis not present

## 2022-05-13 DIAGNOSIS — Z1211 Encounter for screening for malignant neoplasm of colon: Secondary | ICD-10-CM | POA: Diagnosis present

## 2022-05-13 DIAGNOSIS — D123 Benign neoplasm of transverse colon: Secondary | ICD-10-CM | POA: Diagnosis not present

## 2022-05-13 DIAGNOSIS — Z86010 Personal history of colonic polyps: Secondary | ICD-10-CM | POA: Insufficient documentation

## 2022-05-13 DIAGNOSIS — I110 Hypertensive heart disease with heart failure: Secondary | ICD-10-CM | POA: Insufficient documentation

## 2022-05-13 DIAGNOSIS — K3189 Other diseases of stomach and duodenum: Secondary | ICD-10-CM

## 2022-05-13 DIAGNOSIS — K219 Gastro-esophageal reflux disease without esophagitis: Secondary | ICD-10-CM | POA: Diagnosis not present

## 2022-05-13 DIAGNOSIS — F419 Anxiety disorder, unspecified: Secondary | ICD-10-CM | POA: Diagnosis not present

## 2022-05-13 DIAGNOSIS — D122 Benign neoplasm of ascending colon: Secondary | ICD-10-CM | POA: Insufficient documentation

## 2022-05-13 DIAGNOSIS — Z87891 Personal history of nicotine dependence: Secondary | ICD-10-CM | POA: Insufficient documentation

## 2022-05-13 DIAGNOSIS — Z09 Encounter for follow-up examination after completed treatment for conditions other than malignant neoplasm: Secondary | ICD-10-CM

## 2022-05-13 HISTORY — PX: POLYPECTOMY: SHX5525

## 2022-05-13 HISTORY — PX: BIOPSY: SHX5522

## 2022-05-13 HISTORY — PX: ENDOSCOPIC MUCOSAL RESECTION: SHX6839

## 2022-05-13 HISTORY — PX: ESOPHAGOGASTRODUODENOSCOPY (EGD) WITH PROPOFOL: SHX5813

## 2022-05-13 HISTORY — PX: SUBMUCOSAL TATTOO INJECTION: SHX6856

## 2022-05-13 HISTORY — PX: SUBMUCOSAL LIFTING INJECTION: SHX6855

## 2022-05-13 HISTORY — PX: COLONOSCOPY WITH PROPOFOL: SHX5780

## 2022-05-13 HISTORY — PX: HEMOSTASIS CLIP PLACEMENT: SHX6857

## 2022-05-13 LAB — GLUCOSE, CAPILLARY
Glucose-Capillary: 111 mg/dL — ABNORMAL HIGH (ref 70–99)
Glucose-Capillary: 106 mg/dL — ABNORMAL HIGH (ref 70–99)

## 2022-05-13 SURGERY — COLONOSCOPY WITH PROPOFOL
Anesthesia: Monitor Anesthesia Care

## 2022-05-13 MED ORDER — APIXABAN 5 MG PO TABS: 5.0000 mg | ORAL_TABLET | Freq: Two times a day (BID) | ORAL | 6 refills | Status: DC

## 2022-05-13 MED ORDER — LACTATED RINGERS IV SOLN: INTRAVENOUS | Status: DC | PRN

## 2022-05-13 MED ORDER — PROPOFOL 10 MG/ML IV BOLUS
INTRAVENOUS | Status: DC | PRN
  Administered 2022-05-13: 10 mg via INTRAVENOUS
  Administered 2022-05-13: 30 mg via INTRAVENOUS
  Administered 2022-05-13 (×2): 10 mg via INTRAVENOUS

## 2022-05-13 MED ORDER — SPOT INK MARKER SYRINGE KIT
PACK | SUBMUCOSAL | Status: DC | PRN
  Administered 2022-05-13: 4 mL via SUBMUCOSAL

## 2022-05-13 MED ORDER — LIDOCAINE 2% (20 MG/ML) 5 ML SYRINGE
INTRAMUSCULAR | Status: DC | PRN
  Administered 2022-05-13: 60 mg via INTRAVENOUS

## 2022-05-13 MED ORDER — PROPOFOL 500 MG/50ML IV EMUL
INTRAVENOUS | Status: DC | PRN
  Administered 2022-05-13: 150 ug/kg/min via INTRAVENOUS

## 2022-05-13 MED ORDER — OMEPRAZOLE 20 MG PO CPDR: 20.0000 mg | DELAYED_RELEASE_CAPSULE | Freq: Two times a day (BID) | ORAL | 12 refills | Status: DC

## 2022-05-13 MED ORDER — GLYCOPYRROLATE 0.2 MG/ML IJ SOLN
INTRAMUSCULAR | Status: DC | PRN
  Administered 2022-05-13: .2 mg via INTRAVENOUS

## 2022-05-13 SURGICAL SUPPLY — 25 items

## 2022-05-13 NOTE — Op Note (Addendum)
[facility] Patient Name: Kevin Esparza Procedure Date : 05/13/2022 MRN: [id number] Attending MD: Justice Britain , MD Date of Birth: Sep 18, 1961 CSN: [id number] Age: 61 Admit Type: Outpatient Procedure:                Colonoscopy Indications:              Surveillance: Personal history of piecemeal removal                            of adenoma on last colonoscopy (1 year ago) in the                            cecum Providers:                Justice Britain, MD, Burtis Junes, RN, Luan Moore, Technician, Virgilio Belling. Huel Cote, CRNA Referring MD:             Earlie Server, MD, PA Daryel Gerald Medicines:                Monitored Anesthesia Care Complications:            No immediate complications. Estimated Blood Loss:     Estimated blood loss was minimal. Procedure:                Pre-Anesthesia Assessment:                           - Prior to the procedure, a History and Physical                            was performed, and patient medications and                            allergies were reviewed. The patient's tolerance of                            previous anesthesia was also reviewed. The risks                            and benefits of the procedure and the sedation                            options and risks were discussed with the patient.                            All questions were answered, and informed consent                            was obtained. Prior Anticoagulants: The patient has                            taken Eliquis (apixaban), last dose was 2 days  prior to procedure. ASA Grade Assessment: III - A                            patient with severe systemic disease. After                            reviewing the risks and benefits, the patient was                            deemed in satisfactory condition to undergo the                            procedure.                           After obtaining  informed consent, the colonoscope                            was passed under direct vision. Throughout the                            procedure, the patient's blood pressure, pulse, and                            oxygen saturations were monitored continuously. The                            CF-HQ190L ([id number]) Olympus colonoscope was                            introduced through the anus and advanced to the 5                            cm into the ileum. The colonoscopy was performed                            without difficulty. The patient tolerated the                            procedure. The quality of the bowel preparation was                            good. The terminal ileum, ileocecal valve,                            appendiceal orifice, and rectum were photographed. Scope In: 11:24:14 AM Scope Out: 11:56:38 AM Scope Withdrawal Time: 0 hours 28 minutes 5 seconds  Total Procedure Duration: 0 hours 32 minutes 24 seconds  Findings:      The digital rectal exam findings include hemorrhoids. Pertinent       negatives include no palpable rectal lesions.      The terminal ileum and ileocecal valve appeared normal.      A small post mucosectomy scar was found in the cecum. There was no  evidence of the previous polyp.      11, sessile polyps were found in the rectum (1), transverse colon (2),       ascending colon (4) and cecum (4). The polyps were 3 to 14 mm in size.       These polyps were removed with a cold snare. Resection and retrieval       were complete.      Normal mucosa was found in the entire colon otherwise.      Non-bleeding non-thrombosed external and internal hemorrhoids were found       during retroflexion, during perianal exam and during digital exam. The       hemorrhoids were Grade II (internal hemorrhoids that prolapse but reduce       spontaneously). Impression:               - Hemorrhoids found on digital rectal exam.                           - The  examined portion of the ileum was normal.                           - Post mucosectomy scar in the cecum. This area                            suggested no evidence of recurrence at this time.                           - 11, 3 to 14 mm polyps in the rectum, in the                            transverse colon, in the ascending colon and in the                            cecum, removed with a cold snare. Resected and                            retrieved.                           - Normal mucosa in the entire examined colon                            otherwise.                           - Non-bleeding non-thrombosed external and internal                            hemorrhoids. Recommendation:           - The patient will be observed post-procedure,                            until all discharge criteria are met.                           - Discharge  patient to home.                           - Patient has a contact number available for                            emergencies. The signs and symptoms of potential                            delayed complications were discussed with the                            patient. Return to normal activities tomorrow.                            Written discharge instructions were provided to the                            patient.                           - High fiber diet.                           - Use FiberCon 1-2 tablets PO daily.                           - May restart Eliquis in 2-days 7/15 PM.                           - Continue present medications.                           - Await pathology results.                           - Repeat colonoscopy in 1-3 years for surveillance                            based on pathology results. If there is                            hyperplastic tissue present, it could be from 1-2                            polyps, then we may recommend a 2-3 year follow up,                            if all tissues returns  adenomatous, then will be a                            1 year follow up. With having multiple                            Colonoscopies within 1 year, can consider  1-2 year                            follow up. Can try to coordinate these with me, if                            he wishes in the [facility], if he wants to come to [facility];                            otherwise, will need to get him set up with a                            follow up with one of the [facility] providers (his                            previous primary GI has left that practice).                           - The findings and recommendations were discussed                            with the patient.                           - The findings and recommendations were discussed                            with the patient's family. Procedure Code(s):        --- Professional ---                           [phone], Colonoscopy, flexible; with removal of                            tumor(s), polyp(s), or other lesion(s) by snare                            technique Diagnosis Code(s):        --- Professional ---                           K64.1, Second degree hemorrhoids                           Z98.890, Other specified postprocedural states                           K62.1, Rectal polyp                           K63.5, Polyp of colon                           Z09, Encounter for follow-up examination after  completed treatment for conditions other than                            malignant neoplasm                           Z86.010, Personal history of colonic polyps CPT copyright 2019 American Medical Association. All rights reserved. The codes documented in this report are preliminary and upon coder review may  be revised to meet current compliance requirements. Justice Britain, MD 05/13/2022 12:29:28 PM Number of Addenda: 0

## 2022-05-13 NOTE — Anesthesia Procedure Notes (Signed)
Procedure Name: MAC Date/Time: 05/13/2022 10:56 AM  Performed by: Mariea Clonts, CRNAPre-anesthesia Checklist: Patient identified, Emergency Drugs available, Suction available, Patient being monitored and Timeout performed Patient Re-evaluated:Patient Re-evaluated prior to induction Oxygen Delivery Method: Simple face mask and Nasal cannula

## 2022-05-13 NOTE — Op Note (Addendum)
[facility] Patient Name: Kevin Esparza Procedure Date : 05/13/2022 MRN: [id number] Attending MD: Justice Britain , MD Date of Birth: 1961-09-01 CSN: [id number] Age: 61 Admit Type: Outpatient Procedure:                Upper GI endoscopy Indications:              Neuroendocrine Tumor Providers:                Justice Britain, MD, Burtis Junes, RN, Luan Moore, Technician, Virgilio Belling. Huel Cote, CRNA Referring MD:             Earlie Server, MD, PA Daryel Gerald Medicines:                Monitored Anesthesia Care Complications:            No immediate complications. Estimated Blood Loss:     Estimated blood loss was minimal. Procedure:                Pre-Anesthesia Assessment:                           - Prior to the procedure, a History and Physical                            was performed, and patient medications and                            allergies were reviewed. The patient's tolerance of                            previous anesthesia was also reviewed. The risks                            and benefits of the procedure and the sedation                            options and risks were discussed with the patient.                            All questions were answered, and informed consent                            was obtained. Prior Anticoagulants: The patient has                            taken Eliquis (apixaban), last dose was 2 days                            prior to procedure. ASA Grade Assessment: III - A                            patient with severe systemic disease. After  reviewing the risks and benefits, the patient was                            deemed in satisfactory condition to undergo the                            procedure.                           After obtaining informed consent, the endoscope was                            passed under direct vision. Throughout the                             procedure, the patient's blood pressure, pulse, and                            oxygen saturations were monitored continuously. The                            [id number]-H190 ([id number]) Olympus endoscope was introduced                            through the mouth, and advanced to the second part                            of duodenum. The upper GI endoscopy was                            accomplished without difficulty. The patient                            tolerated the procedure. Scope In: Scope Out: Findings:      No gross lesions were noted in the entire esophagus.      A non-obstructing Schatzki ring was found at the gastroesophageal       junction.      The Z-line was regular and was found 39 cm from the incisors.      A 3 cm hiatal hernia was present.      Localized moderate mucosal changes characterized by altered texture were       found on the greater curvature of the distal gastric body. 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 Preparations were made       for mucosal resection. NBI imaging and White-light endoscopy was done to       demarcate the borders of the lesion. Saline was injected to raise the       lesion. Piecemeal mucosal resection using a snare was performed.       Resection and retrieval were complete. To prevent bleeding after mucosal       resection, one hemostatic clip was successfully placed (MR conditional).       There was no bleeding at the end of the procedure. Area  laterally to the       resection was tattooed with an injection of Spot (carbon black).      A single nodular mucosal area (approximately 8 mm in size was found in       the gastric antrum (posterior wall). The lesion was removed with a cold       snare. Resection and retrieval were complete.      Patchy mildly erythematous mucosa without bleeding was found in the       entire examined stomach. Biopsies were  taken with a cold forceps for       histology and Helicobacter pylori testing.      No gross lesions were noted in the duodenal bulb, in the first portion       of the duodenum and in the second portion of the duodenum. Impression:               - No gross lesions in esophagus. Non-obstructing                            Schatzki ring. Z-line regular, 39 cm from the                            incisors.                           - 3 cm hiatal hernia.                           - Texture changed mucosa in the greater curvature                            of the distal gastric body. Removed with mucosal                            resection. Clip (MR conditional) was placed.                            Tattooed laterally.                           - Nodule in the gastric antrum. Removed with snare                            polypectomy.                           - Erythematous mucosa in the stomach. Biopsied.                           - No gross lesions in the duodenal bulb, in the                            first portion of the duodenum and in the second                            portion of the duodenum. Recommendation:           -  Proceed to scheduled colonoscopy.                           - Continue present medications.                           - Increase to Omeprazole 20 mg twice daily.                           - Minimize NSAID use as able.                           - Anticoagulation restart on Colonoscopy report.                           - Observe patient's clinical course.                           - Await pathology results.                           - If the resected tissue returns as Gastric NET,                            then will plan repeat follow up with EGD/EUS in                            40-month. If no tissue returns as Gastric NET,                            then will defer to our Oncology colleagues to                            consider timing of repeat EGD - consider a  2-year                            interval if no other issues or imaging suggest we                            would need to perform earlier.                           - The findings and recommendations were discussed                            with the patient.                           - The findings and recommendations were discussed                            with the patient's family. Procedure Code(s):        --- Professional ---  43254, Esophagogastroduodenoscopy, flexible,                            transoral; with endoscopic mucosal resection Diagnosis Code(s):        --- Professional ---                           K22.2, Esophageal obstruction                           K44.9, Diaphragmatic hernia without obstruction or                            gangrene                           K31.89, Other diseases of stomach and duodenum CPT copyright 2019 American Medical Association. All rights reserved. The codes documented in this report are preliminary and upon coder review may  be revised to meet current compliance requirements. Justice Britain, MD 05/13/2022 12:20:27 PM Number of Addenda: 0

## 2022-05-13 NOTE — Anesthesia Preprocedure Evaluation (Addendum)
Anesthesia Evaluation  Patient identified by MRN, date of birth, ID band Patient awake    Reviewed: Allergy & Precautions, NPO status , Patient's Chart, lab work & pertinent test results  Airway Mallampati: I  TM Distance: >3 FB     Dental  (+) Edentulous Upper, Edentulous Lower   Pulmonary pneumonia, Patient abstained from smoking., former smoker,    breath sounds clear to auscultation       Cardiovascular hypertension, Pt. on medications + CAD and +CHF   Rhythm:Regular Rate:Normal  ECHO 5/23 1. Left ventricular ejection fraction, by estimation, is 60 to 65%. The  left ventricle has normal function. The left ventricle has no regional  wall motion abnormalities. Left ventricular diastolic parameters are  consistent with Grade I diastolic  dysfunction (impaired relaxation).  2. Right ventricular systolic function is normal. The right ventricular  size is normal. There is normal pulmonary artery systolic pressure. The  estimated right ventricular systolic pressure is 66.5 mmHg.  3. A small pericardial effusion is present. Estimated 0.8 cm or less  circumferential. There is no evidence of cardiac tamponade.  4. The mitral valve is normal in structure. No evidence of mitral valve  regurgitation. No evidence of mitral stenosis.  5. The aortic valve is tricuspid. Aortic valve regurgitation is not  visualized. No aortic stenosis is present.  6. The inferior vena cava is normal in size with greater than 50%  respiratory variability, suggesting right atrial pressure of 3 mmHg.    Neuro/Psych Anxiety    GI/Hepatic Neg liver ROS, GERD  Medicated,  Endo/Other  diabetes, Well Controlled, Type 2  Renal/GU negative Renal ROS     Musculoskeletal   Abdominal   Peds  Hematology   Anesthesia Other Findings   Reproductive/Obstetrics                            Anesthesia Physical  Anesthesia  Plan  ASA: III  Anesthesia Plan: MAC   Post-op Pain Management:    Induction:   PONV Risk Score and Plan: 1 and Propofol infusion  Airway Management Planned: Nasal Cannula and Simple Face Mask  Additional Equipment:   Intra-op Plan:   Post-operative Plan:   Informed Consent: I have reviewed the patients History and Physical, chart, labs and discussed the procedure including the risks, benefits and alternatives for the proposed anesthesia with the patient or authorized representative who has indicated his/her understanding and acceptance.     Dental advisory given  Plan Discussed with: CRNA and Anesthesiologist  Anesthesia Plan Comments:         Anesthesia Quick Evaluation

## 2022-05-13 NOTE — H&P (Signed)
GASTROENTEROLOGY PROCEDURE H&P NOTE   Primary Care Physician: Ranae Plumber, PA  HPI: Kevin Esparza is a 61 y.o. 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   Pneumonia    several times   Past Surgical History:  Procedure Laterality Date   APPENDECTOMY  2005   BIOPSY  03/19/2021   Procedure: BIOPSY;  Surgeon: Irving Copas., MD;  Location: [place];  Service: Gastroenterology;;   [place]   c4-c7   CHOLECYSTECTOMY  2005   COLONOSCOPY WITH PROPOFOL N/A 12/31/2020   Procedure: COLONOSCOPY WITH PROPOFOL;  Surgeon: Virgel Manifold, MD;  Location: [facility];  Service: Endoscopy;  Laterality: N/A;   COLONOSCOPY WITH PROPOFOL N/A 03/19/2021   Procedure: COLONOSCOPY WITH PROPOFOL;  Surgeon: Rush Landmark Telford Nab., MD;  Location: [place];  Service: Gastroenterology;  Laterality: N/A;   ENDOSCOPIC MUCOSAL RESECTION  03/19/2021   Procedure: ENDOSCOPIC MUCOSAL RESECTION;  Surgeon: Rush Landmark Telford Nab., MD;  Location: [place];  Service: Gastroenterology;;   ESOPHAGOGASTRODUODENOSCOPY (EGD) WITH PROPOFOL N/A 12/31/2020   Procedure: ESOPHAGOGASTRODUODENOSCOPY (EGD) WITH PROPOFOL;  Surgeon: Virgel Manifold, MD;  Location: [facility];  Service: Endoscopy;  Laterality: N/A;   ESOPHAGOGASTRODUODENOSCOPY (EGD) WITH PROPOFOL N/A 03/19/2021   Procedure: ESOPHAGOGASTRODUODENOSCOPY (EGD) WITH PROPOFOL;  Surgeon: Rush Landmark Telford Nab., MD;   Location: [place];  Service: Gastroenterology;  Laterality: N/A;   HEMOSTASIS CLIP PLACEMENT  03/19/2021   Procedure: HEMOSTASIS CLIP PLACEMENT;  Surgeon: Irving Copas., MD;  Location: [place];  Service: Gastroenterology;;   MULTIPLE TOOTH EXTRACTIONS     dentures   POLYPECTOMY  03/19/2021   Procedure: POLYPECTOMY;  Surgeon: Irving Copas., MD;  Location: [place];  Service: Gastroenterology;;   SUBMUCOSAL LIFTING INJECTION  03/19/2021   Procedure: SUBMUCOSAL LIFTING INJECTION;  Surgeon: Irving Copas., MD;  Location: [place];  Service: Gastroenterology;;   TONSILLECTOMY     UPPER ESOPHAGEAL ENDOSCOPIC ULTRASOUND (EUS) N/A 03/19/2021   Procedure: UPPER ESOPHAGEAL ENDOSCOPIC ULTRASOUND (EUS);  Surgeon: Irving Copas., MD;  Location: [place];  Service: Gastroenterology;  Laterality: N/A;   No current facility-administered medications for this encounter.   No current facility-administered medications for this encounter. No Known Allergies Family History  Problem Relation Age of Onset   Kidney cancer Maternal Grandmother    Liver cancer Maternal Grandmother    Stroke Father    Cancer Father        unknown origin   Hypertension Mother    Diabetes Mother    Osteoarthritis Mother    Osteoporosis Mother    Lung cancer Maternal Grandfather    Kidney disease Neg Hx    Prostate cancer Neg Hx    Bladder Cancer Neg Hx    Colon cancer Neg Hx    Esophageal cancer Neg Hx    Inflammatory bowel disease Neg Hx    Pancreatic cancer Neg Hx    Rectal cancer Neg  Hx    Stomach cancer Neg Hx    Social History   Socioeconomic History   Marital status: Married    Spouse name: Not on file   Number of children: 3   Years of education: Not on file   Highest education level: Not on file  Occupational History   Not on file  Tobacco Use   Smoking status: Former    Years: 14.00    Types: Cigarettes    Quit date: 03/05/2004    Years since  quitting: 18.2   Smokeless tobacco: Current    Types: Chew  Vaping Use   Vaping Use: Never used  Substance and Sexual Activity   Alcohol use: Yes    Alcohol/week: 1.0 standard drink of alcohol    Types: 1 Cans of beer per week    Comment: Occasionally   Drug use: Yes    Frequency: 1.0 times per week    Types: Marijuana   Sexual activity: Yes  Other Topics Concern   Not on file  Social History Narrative   Not on file   Social Determinants of Health   Financial Resource Strain: Not on file  Food Insecurity: Not on file  Transportation Needs: Not on file  Physical Activity: Not on file  Stress: Not on file  Social Connections: Not on file  Intimate Partner Violence: Not on file    Physical Exam: Today's Vitals   05/13/22 0919  BP: (!) 160/81  Pulse: 82  Resp: 13  Temp: 97.7 F (36.5 C)  TempSrc: Temporal  SpO2: 98%  Weight: 84.8 kg  Height: '5\' 10"'$  (1.778 m)  PainSc: 0-No pain   Body mass index is 26.83 kg/m. GEN: NAD EYE: Sclerae anicteric ENT: MMM CV: Non-tachycardic GI: Soft, NT/ND NEURO:  Alert & Oriented x 3  Lab Results: No results for input(s): "WBC", "HGB", "HCT", "PLT" in the last 72 hours. BMET No results for input(s): "NA", "K", "CL", "CO2", "GLUCOSE", "BUN", "CREATININE", "CALCIUM" in the last 72 hours. LFT No results for input(s): "PROT", "ALBUMIN", "AST", "ALT", "ALKPHOS", "BILITOT", "BILIDIR", "IBILI" in the last 72 hours. PT/INR No results for input(s): "LABPROT", "INR" in the last 72 hours.   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  The risks and benefits of endoscopic evaluation/treatment were discussed with the patient and/or family; these include but are not limited to the risk of perforation, infection, bleeding, missed lesions, lack of diagnosis, severe illness requiring hospitalization, as well as  anesthesia and sedation related illnesses.  The patient's history has been reviewed, patient examined, no change in status, and deemed stable for procedure.  The patient and/or family is agreeable to proceed.    Justice Britain, MD [facility] Office # [phone]

## 2022-05-14 ENCOUNTER — Encounter (HOSPITAL_COMMUNITY): Payer: Self-pay | Admitting: Gastroenterology

## 2022-05-14 LAB — SURGICAL PATHOLOGY

## 2022-05-14 NOTE — Transfer of Care (Signed)
Immediate Anesthesia Transfer of Care Note  Patient: Kevin Esparza  Procedure(s) Performed: COLONOSCOPY WITH PROPOFOL ENDOSCOPIC MUCOSAL RESECTION ESOPHAGOGASTRODUODENOSCOPY (EGD) WITH PROPOFOL SUBMUCOSAL LIFTING INJECTION SUBMUCOSAL TATTOO INJECTION POLYPECTOMY BIOPSY HEMOSTASIS CLIP PLACEMENT  Patient Location: PACU  Anesthesia Type:MAC  Level of Consciousness: awake, alert  and oriented  Airway & Oxygen Therapy: Patient Spontanous Breathing and Patient connected to nasal cannula oxygen  Post-op Assessment: Report given to RN and Post -op Vital signs reviewed and stable  Post vital signs: Reviewed and stable  Last Vitals:  Vitals Value Taken Time  BP 151/87 05/13/22 1246  Temp 36.7 C 05/13/22 1245  Pulse 70 05/13/22 1248  Resp 14 05/13/22 1245  SpO2 98 % 05/13/22 1248  Vitals shown include unvalidated device data.  Last Pain:  Vitals:   05/13/22 1245  TempSrc:   PainSc: 0-No pain         Complications: No notable events documented.

## 2022-05-14 NOTE — Anesthesia Postprocedure Evaluation (Signed)
Anesthesia Post Note  Patient: Kevin Esparza  Procedure(s) Performed: COLONOSCOPY WITH PROPOFOL ENDOSCOPIC MUCOSAL RESECTION ESOPHAGOGASTRODUODENOSCOPY (EGD) WITH PROPOFOL SUBMUCOSAL LIFTING INJECTION SUBMUCOSAL TATTOO INJECTION POLYPECTOMY BIOPSY HEMOSTASIS CLIP PLACEMENT     Patient location during evaluation: PACU Anesthesia Type: MAC Level of consciousness: awake and alert Pain management: pain level controlled Vital Signs Assessment: post-procedure vital signs reviewed and stable Respiratory status: spontaneous breathing, nonlabored ventilation, respiratory function stable and patient connected to nasal cannula oxygen Cardiovascular status: stable and blood pressure returned to baseline Postop Assessment: no apparent nausea or vomiting Anesthetic complications: no   No notable events documented.  Last Vitals:  Vitals:   05/13/22 1230 05/13/22 1245  BP: (!) 161/76 (!) 151/87  Pulse: 68 73  Resp: 12 14  Temp:  36.7 C  SpO2: 97% 98%    Last Pain:  Vitals:   05/13/22 1245  TempSrc:   PainSc: 0-No pain   Pain Goal:                   Kevin Esparza

## 2022-05-17 ENCOUNTER — Encounter: Payer: Self-pay | Admitting: Gastroenterology

## 2022-06-03 ENCOUNTER — Ambulatory Visit (INDEPENDENT_AMBULATORY_CARE_PROVIDER_SITE_OTHER): Payer: Medicare HMO

## 2022-06-03 DIAGNOSIS — I319 Disease of pericardium, unspecified: Secondary | ICD-10-CM

## 2022-06-03 DIAGNOSIS — I3139 Other pericardial effusion (noninflammatory): Secondary | ICD-10-CM

## 2022-06-03 LAB — ECHOCARDIOGRAM LIMITED
Calc EF: 58.2 %
P 1/2 time: 490 msec
S' Lateral: 3 cm
Single Plane A2C EF: 55.1 %
Single Plane A4C EF: 61.6 %

## 2022-06-10 ENCOUNTER — Ambulatory Visit: Payer: Medicare HMO | Admitting: Cardiology

## 2022-06-10 ENCOUNTER — Encounter: Payer: Self-pay | Admitting: Cardiology

## 2022-06-10 VITALS — BP 134/82 | HR 57 | Ht 70.0 in | Wt 175.5 lb

## 2022-06-10 DIAGNOSIS — I480 Paroxysmal atrial fibrillation: Secondary | ICD-10-CM

## 2022-06-10 DIAGNOSIS — I10 Essential (primary) hypertension: Secondary | ICD-10-CM | POA: Diagnosis not present

## 2022-06-10 NOTE — Patient Instructions (Signed)
Medication Instructions: Your physician recommends that you continue on your current medications as directed. Please refer to the Current Medication list given to you today.   *If you need a refill on your cardiac medications before your next appointment, please call your pharmacy*   Lab Work: None   If you have labs (blood work) drawn today and your tests are completely normal, you will receive your results only by: [place] (if you have MyChart) OR A paper copy in the mail If you have any lab test that is abnormal or we need to change your treatment, we will call you to review the results.   Testing/Procedures: None     Follow-Up: At [facility], you and your health needs are our priority.  As part of our continuing mission to provide you with exceptional heart care, we have created designated Provider Care Teams.  These Care Teams include your primary Cardiologist (physician) and Advanced Practice Providers (APPs -  Physician Assistants and Nurse Practitioners) who all work together to provide you with the care you need, when you need it.  We recommend signing up for the patient portal called "MyChart".  Sign up information is provided on this After Visit Summary.  MyChart is used to connect with patients for Virtual Visits (Telemedicine).  Patients are able to view lab/test results, encounter notes, upcoming appointments, etc.  Non-urgent messages can be sent to your provider as well.   To learn more about what you can do with MyChart, go to [email or web address].    Your next appointment:   8 month(s)  The format for your next appointment:   In Person  Provider:   Kate Sable, MD    Important Information About Sugar

## 2022-06-10 NOTE — Progress Notes (Signed)
Cardiology Office Note:    Date:  06/10/2022   ID:  Kevin Esparza, DOB 1961/09/03, MRN [id number]  PCP:  Ranae Plumber, [place] Prov[facility] Cardiologist:  Ida Rogue, MD     Referring MD: Ranae Plumber, [place]   Chief Complaint  Patient presents with   2 month follow up & discuss Echo results     "Doing well." Medications reviewed by the patient verbally.     History of Present Illness:    Kevin Esparza is a 61 y.o. male with a hx of paroxysmal atrial fibrillation, hypertension, diabetes, GERD, former smoker x 20 years presenting for follow-up.  Last seen due to paroxysmal atrial fibrillation, Lopressor was increased to 50 mg twice daily, maintaining sinus rhythm on last visit.  He thought A-fib occurred in the context of taking colchicine for pericarditis.  Initially seen in the hospital 3 months ago with symptoms of chest pain, diagnosed with pericarditis.  Managed with colchicine, ibuprofen with resolution of symptoms.  Repeat limited echo showed resolution of small pericardial effusion.  He feels well, tolerating Eliquis with no bleeding issues.  Denies palpitations.  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   Pneumonia    several times    Past Surgical History:  Procedure Laterality Date   APPENDECTOMY  2005   BIOPSY  03/19/2021   Procedure: BIOPSY;  Surgeon: Irving Copas., MD;   Location: [place];  Service: Gastroenterology;;   BIOPSY  05/13/2022   Procedure: BIOPSY;  Surgeon: Irving Copas., MD;  Location: [place];  Service: Gastroenterology;;   [place]   c4-c7   CHOLECYSTECTOMY  2005   COLONOSCOPY WITH PROPOFOL N/A 12/31/2020   Procedure: COLONOSCOPY WITH PROPOFOL;  Surgeon: Virgel Manifold, MD;  Location: [facility];  Service: Endoscopy;  Laterality: N/A;   COLONOSCOPY WITH PROPOFOL N/A 03/19/2021   Procedure: COLONOSCOPY WITH PROPOFOL;  Surgeon: Rush Landmark Telford Nab., MD;  Location: [place];  Service: Gastroenterology;  Laterality: N/A;   COLONOSCOPY WITH PROPOFOL N/A 05/13/2022   Procedure: COLONOSCOPY WITH PROPOFOL;  Surgeon: Rush Landmark Telford Nab., MD;  Location: [place];  Service: Gastroenterology;  Laterality: N/A;   ENDOSCOPIC MUCOSAL RESECTION  03/19/2021   Procedure: ENDOSCOPIC MUCOSAL RESECTION;  Surgeon: Rush Landmark Telford Nab., MD;  Location: [place];  Service: Gastroenterology;;   ENDOSCOPIC MUCOSAL RESECTION N/A 05/13/2022   Procedure: ENDOSCOPIC MUCOSAL RESECTION;  Surgeon: Irving Copas., MD;  Location: [place];  Service: Gastroenterology;  Laterality: N/A;   ESOPHAGOGASTRODUODENOSCOPY (EGD) WITH PROPOFOL N/A 12/31/2020   Procedure: ESOPHAGOGASTRODUODENOSCOPY (EGD) WITH PROPOFOL;  Surgeon: Virgel Manifold, MD;  Location: [facility];  Service: Endoscopy;  Laterality: N/A;   ESOPHAGOGASTRODUODENOSCOPY (EGD) WITH PROPOFOL N/A 03/19/2021   Procedure: ESOPHAGOGASTRODUODENOSCOPY (EGD) WITH PROPOFOL;  Surgeon: Rush Landmark Telford Nab., MD;  Location: [place];  Service: Gastroenterology;  Laterality: N/A;   ESOPHAGOGASTRODUODENOSCOPY (EGD) WITH PROPOFOL N/A 05/13/2022   Procedure: ESOPHAGOGASTRODUODENOSCOPY (EGD) WITH PROPOFOL;  Surgeon: Rush Landmark Telford Nab., MD;  Location: [place];  Service: Gastroenterology;  Laterality: N/A;   HEMOSTASIS CLIP PLACEMENT  03/19/2021    Procedure: HEMOSTASIS CLIP PLACEMENT;  Surgeon: Irving Copas., MD;  Location: [place];  Service: Gastroenterology;;   HEMOSTASIS CLIP PLACEMENT  05/13/2022   Procedure: HEMOSTASIS CLIP PLACEMENT;  Surgeon: Irving Copas., MD;  Location: [place];  Service: Gastroenterology;;   MULTIPLE TOOTH EXTRACTIONS     dentures   POLYPECTOMY  03/19/2021   Procedure: POLYPECTOMY;  Surgeon: Irving Copas., MD;  Location: [place];  Service: Gastroenterology;;   POLYPECTOMY  05/13/2022   Procedure: POLYPECTOMY;  Surgeon: Irving Copas., MD;  Location: [place];  Service: Gastroenterology;;  EGD and colon   SUBMUCOSAL LIFTING INJECTION  03/19/2021   Procedure: SUBMUCOSAL LIFTING INJECTION;  Surgeon: Irving Copas., MD;  Location: [place];  Service: Gastroenterology;;   SUBMUCOSAL LIFTING INJECTION  05/13/2022   Procedure: SUBMUCOSAL LIFTING INJECTION;  Surgeon: Irving Copas., MD;  Location: [place];  Service: Gastroenterology;;   SUBMUCOSAL TATTOO INJECTION  05/13/2022   Procedure: SUBMUCOSAL TATTOO INJECTION;  Surgeon: Irving Copas., MD;  Location: [place];  Service: Gastroenterology;;   TONSILLECTOMY     UPPER ESOPHAGEAL ENDOSCOPIC ULTRASOUND (EUS) N/A 03/19/2021   Procedure: UPPER ESOPHAGEAL ENDOSCOPIC ULTRASOUND (EUS);  Surgeon: Irving Copas., MD;  Location: [place];  Service: Gastroenterology;  Laterality: N/A;    Current Medications: Current Meds  Medication Sig   acetaminophen (TYLENOL) 650 MG CR tablet Take 1,300 mg by mouth See admin instructions. Take 1300 mg in the morning, may take a second 1300 mg dose during the day as needed for pain   albuterol (VENTOLIN HFA) 108 (90 Base) MCG/ACT inhaler Inhale 2 puffs into the lungs every 6 (six) hours as needed for wheezing or shortness of breath.   apixaban (ELIQUIS) 5 MG TABS tablet Take 1 tablet (5 mg total) by mouth 2 (two) times daily.    atorvastatin (LIPITOR) 20 MG tablet Take 20 mg by mouth daily.   colchicine 0.6 MG tablet Take 0.6 mg by mouth 2 (two) times daily as needed (gout).   cyclobenzaprine (FLEXERIL) 10 MG tablet Take 10 mg by mouth 3 (three) times daily as needed for muscle spasms.   diclofenac Sodium (VOLTAREN) 1 % GEL Apply 2 g topically daily as needed (pain).   enalapril (VASOTEC) 5 MG tablet Take 5 mg by mouth daily.   gabapentin (NEURONTIN) 300 MG capsule Take 300-600 mg by mouth at bedtime.   ibuprofen (ADVIL) 800 MG tablet Take 1 tablet (800 mg) by mouth once daily   lidocaine (LIDODERM) 5 % Place 1 patch onto the skin daily as needed (pain/spasms). Remove & Discard patch within 12 hours or as directed by MD   Magnesium 250 MG TABS Take 250 mg by mouth 2 (two) times daily.   metFORMIN (GLUCOPHAGE) 1000 MG tablet Take 1,000 mg by mouth daily with breakfast.   metoprolol tartrate (LOPRESSOR) 50 MG tablet Take 50 mg by mouth 2 (two) times daily.   omeprazole (PRILOSEC) 20 MG capsule Take 1 capsule (20 mg total) by mouth 2 (two) times daily before a meal. Take 20 mg daily, may take a second 20 mg dose as needed for heartburn   tamsulosin (FLOMAX) 0.4 MG CAPS capsule Take 1 capsule (0.4 mg total) by mouth daily.     Allergies:  Patient has no known allergies.   Social History   Socioeconomic History   Marital status: Married    Spouse name: Not on file   Number of children: 3   Years of education: Not on file   Highest education level: Not on file  Occupational History   Not on file  Tobacco Use   Smoking status: Former    Years: 14.00    Types: Cigarettes    Quit date: 03/05/2004    Years since quitting: 18.2   Smokeless tobacco: Current    Types: Chew  Vaping Use   Vaping Use: Never used  Substance and Sexual Activity   Alcohol use: Yes    Alcohol/week: 1.0 standard drink of alcohol    Types: 1 Cans of beer per week    Comment: Occasionally   Drug use: Yes    Frequency: 1.0 times per week     Types: Marijuana   Sexual activity: Yes  Other Topics Concern   Not on file  Social History Narrative   Not on file   Social Determinants of Health   Financial Resource Strain: Not on file  Food Insecurity: Not on file  Transportation Needs: Not on file  Physical Activity: Not on file  Stress: Not on file  Social Connections: Not on file     Family History: The patient's family history includes Cancer in his father; Diabetes in his mother; Hypertension in his mother; Kidney cancer in his maternal grandmother; Liver cancer in his maternal grandmother; Lung cancer in his maternal grandfather; Osteoarthritis in his mother; Osteoporosis in his mother; Stroke in his father. There is no history of Kidney disease, Prostate cancer, Bladder Cancer, Colon cancer, Esophageal cancer, Inflammatory bowel disease, Pancreatic cancer, Rectal cancer, or Stomach cancer.  ROS:   Please see the history of present illness.     All other systems reviewed and are negative.  EKGs/Labs/Other Studies Reviewed:    The following studies were reviewed today:   EKG:  EKG is  ordered today.  The ekg ordered today demonstrates sinus bradycardia, otherwise normal ECG  Recent Labs: 03/24/2022: ALT 23 04/02/2022: BUN 8; Creatinine, Ser 1.03; Hemoglobin 13.3; Magnesium 1.5; Platelets 293; Potassium 3.7; Sodium 136; TSH 2.099  Recent Lipid Panel    Component Value Date/Time   CHOL 133 03/25/2022 0147   TRIG 93 03/25/2022 0147   HDL 38 (L) 03/25/2022 0147   CHOLHDL 3.5 03/25/2022 0147   VLDL 19 03/25/2022 0147   LDLCALC 76 03/25/2022 0147     Risk Assessment/Calculations:        Physical Exam:    VS:  BP 134/82 (BP Location: Left Arm, Patient Position: Sitting, Cuff Size: Normal)   Pulse (!) 57   Ht '5\' 10"'$  (1.778 m)   Wt 175 lb 8 oz (79.6 kg)   SpO2 98%   BMI 25.18 kg/m     Wt Readings from Last 3 Encounters:  06/10/22 175 lb 8 oz (79.6 kg)  05/13/22 187 lb (84.8 kg)  04/07/22 176 lb 8 oz  (80.1 kg)     GEN:  Well nourished, well developed in no acute distress HEENT: Normal NECK: No JVD; No carotid bruits CARDIAC: RRR, no murmurs, rubs, gallops RESPIRATORY:  Clear to auscultation without rales, wheezing or rhonchi  ABDOMEN: Soft, non-tender, non-distended MUSCULOSKELETAL:  No edema; No deformity  SKIN: Warm and dry NEUROLOGIC:  Alert and oriented x 3 PSYCHIATRIC:  Normal affect   ASSESSMENT:    1. Paroxysmal atrial fibrillation (HCC)  2. Primary hypertension    PLAN:    In order of problems listed above:  Paroxysmal atrial fibrillation, currently in sinus rhythm, continue Lopressor 50 mg twice daily, Eliquis 5 mg twice daily. Hypertension, BP controlled, continue Lopressor, Vasotec.  Follow-up in 6 to 12 months       Medication Adjustments/Labs and Tests Ordered: Current medicines are reviewed at length with the patient today.  Concerns regarding medicines are outlined above.  Orders Placed This Encounter  Procedures   EKG 12-Lead   No orders of the defined types were placed in this encounter.   Patient Instructions  Medication Instructions: Your physician recommends that you continue on your current medications as directed. Please refer to the Current Medication list given to you today.   *If you need a refill on your cardiac medications before your next appointment, please call your pharmacy*   Lab Work: None   If you have labs (blood work) drawn today and your tests are completely normal, you will receive your results only by: [place] (if you have MyChart) OR A paper copy in the mail If you have any lab test that is abnormal or we need to change your treatment, we will call you to review the results.   Testing/Procedures: None     Follow-Up: At [facility], you and your health needs are our priority.  As part of our continuing mission to provide you with exceptional heart care, we have created designated Provider Care Teams.   These Care Teams include your primary Cardiologist (physician) and Advanced Practice Providers (APPs -  Physician Assistants and Nurse Practitioners) who all work together to provide you with the care you need, when you need it.  We recommend signing up for the patient portal called "MyChart".  Sign up information is provided on this After Visit Summary.  MyChart is used to connect with patients for Virtual Visits (Telemedicine).  Patients are able to view lab/test results, encounter notes, upcoming appointments, etc.  Non-urgent messages can be sent to your provider as well.   To learn more about what you can do with MyChart, go to [email or web address].    Your next appointment:   8 month(s)  The format for your next appointment:   In Person  Provider:   Kate Sable, MD    Important Information About Sugar         Signed, Kate Sable, MD  06/10/2022 12:13 PM    [place]

## 2022-06-27 ENCOUNTER — Other Ambulatory Visit: Payer: Self-pay

## 2022-06-27 ENCOUNTER — Emergency Department
Admission: EM | Admit: 2022-06-27 | Discharge: 2022-06-27 | Disposition: A | Discharge status: Home / Self Care | Payer: Medicare HMO | Attending: Emergency Medicine | Admitting: Emergency Medicine

## 2022-06-27 ENCOUNTER — Encounter: Payer: Self-pay | Admitting: Emergency Medicine

## 2022-06-27 ENCOUNTER — Emergency Department: Payer: Medicare HMO

## 2022-06-27 DIAGNOSIS — K2970 Gastritis, unspecified, without bleeding: Secondary | ICD-10-CM | POA: Insufficient documentation

## 2022-06-27 DIAGNOSIS — E119 Type 2 diabetes mellitus without complications: Secondary | ICD-10-CM | POA: Diagnosis not present

## 2022-06-27 DIAGNOSIS — I509 Heart failure, unspecified: Secondary | ICD-10-CM | POA: Diagnosis not present

## 2022-06-27 DIAGNOSIS — R519 Headache, unspecified: Secondary | ICD-10-CM

## 2022-06-27 DIAGNOSIS — I110 Hypertensive heart disease with heart failure: Secondary | ICD-10-CM | POA: Insufficient documentation

## 2022-06-27 LAB — LIPASE, BLOOD: Lipase: 39 U/L (ref 11–51)

## 2022-06-27 LAB — HEPATIC FUNCTION PANEL
ALT: 14 U/L (ref 0–44)
AST: 21 U/L (ref 15–41)
Albumin: 3.5 g/dL (ref 3.5–5.0)
Alkaline Phosphatase: 71 U/L (ref 38–126)
Bilirubin, Direct: 0.1 mg/dL (ref 0.0–0.2)
Indirect Bilirubin: 0.8 mg/dL (ref 0.3–0.9)
Total Bilirubin: 0.9 mg/dL (ref 0.3–1.2)
Total Protein: 7.2 g/dL (ref 6.5–8.1)

## 2022-06-27 LAB — CBC
HCT: 42.8 % (ref 39.0–52.0)
Hemoglobin: 14.2 g/dL (ref 13.0–17.0)
MCH: 27 pg (ref 26.0–34.0)
MCHC: 33.2 g/dL (ref 30.0–36.0)
MCV: 81.5 fL (ref 80.0–100.0)
Platelets: 193 10*3/uL (ref 150–400)
RBC: 5.25 MIL/uL (ref 4.22–5.81)
RDW: 14.6 % (ref 11.5–15.5)
WBC: 7.2 10*3/uL (ref 4.0–10.5)
nRBC: 0 % (ref 0.0–0.2)

## 2022-06-27 LAB — BASIC METABOLIC PANEL
Anion gap: 8 (ref 5–15)
BUN: 6 mg/dL — ABNORMAL LOW (ref 8–23)
CO2: 26 mmol/L (ref 22–32)
Calcium: 9.1 mg/dL (ref 8.9–10.3)
Chloride: 100 mmol/L (ref 98–111)
Creatinine, Ser: 1.07 mg/dL (ref 0.61–1.24)
GFR, Estimated: >60 mL/min (ref >60)
Glucose, Bld: 138 mg/dL — ABNORMAL HIGH (ref 70–99)
Potassium: 4.1 mmol/L (ref 3.5–5.1)
Sodium: 134 mmol/L — ABNORMAL LOW (ref 135–145)

## 2022-06-27 LAB — URINALYSIS, ROUTINE W REFLEX MICROSCOPIC
Bilirubin Urine: NEGATIVE
Glucose, UA: NEGATIVE mg/dL
Hgb urine dipstick: NEGATIVE
Ketones, ur: NEGATIVE mg/dL
Leukocytes,Ua: NEGATIVE
Nitrite: NEGATIVE
Protein, ur: NEGATIVE mg/dL
Specific Gravity, Urine: 1.012 (ref 1.005–1.030)
pH: 5 (ref 5.0–8.0)

## 2022-06-27 MED ORDER — KETOROLAC TROMETHAMINE 15 MG/ML IJ SOLN
15.0000 mg | Freq: Once | INTRAMUSCULAR | Status: AC
  Administered 2022-06-27: 15 mg via INTRAMUSCULAR
  Filled 2022-06-27: qty 1

## 2022-06-27 MED ORDER — METOCLOPRAMIDE HCL 10 MG PO TABS: 10.0000 mg | ORAL_TABLET | Freq: Four times a day (QID) | ORAL | 0 refills | Status: DC | PRN

## 2022-06-27 MED ORDER — SUCRALFATE 1 G PO TABS
1.0000 g | ORAL_TABLET | Freq: Four times a day (QID) | ORAL | 1 refills | Status: DC
Start: 2022-06-27 — End: 2023-01-31

## 2022-06-27 MED ORDER — FAMOTIDINE 20 MG PO TABS: 20.0000 mg | ORAL_TABLET | Freq: Two times a day (BID) | ORAL | 0 refills | Status: DC

## 2022-06-27 MED ORDER — KETOROLAC TROMETHAMINE 10 MG PO TABS: 10.0000 mg | ORAL_TABLET | Freq: Four times a day (QID) | ORAL | 0 refills | Status: DC | PRN

## 2022-06-27 MED ORDER — SUCRALFATE 1 G PO TABS
1.0000 g | ORAL_TABLET | Freq: Once | ORAL | Status: AC
  Administered 2022-06-27: 1 g via ORAL
  Filled 2022-06-27: qty 1

## 2022-06-27 MED ORDER — FAMOTIDINE 20 MG PO TABS
40.0000 mg | ORAL_TABLET | Freq: Once | ORAL | Status: AC
  Administered 2022-06-27: 40 mg via ORAL
  Filled 2022-06-27: qty 2

## 2022-06-27 MED ORDER — METOCLOPRAMIDE HCL 10 MG PO TABS
10.0000 mg | ORAL_TABLET | Freq: Once | ORAL | Status: AC
Start: 2022-06-27 — End: 2022-06-27
  Administered 2022-06-27: 10 mg via ORAL
  Filled 2022-06-27: qty 1

## 2022-06-27 NOTE — ED Provider Triage Note (Signed)
Emergency Medicine Provider Triage Evaluation Note  Kevin Esparza, a 61 y.o. male  was evaluated in triage.  Pt complains of right flank and abdominal discomfort as well as posterior headache.  He reports onset of symptoms 6 days prior.  Denies any fevers, chills, or sweats.  Patient denies any dysuria, hematuria, or urinary retention.  He also denies any vision change, ataxia, paralysis, weakness.  Review of Systems  Positive: Right flank pain, posterior headache Negative: FCS  Physical Exam  BP (!) 101/45 (BP Location: Left Arm)   Pulse 93   Temp 98.4 F (36.9 C) (Oral)   Resp 20   Ht '5\' 10"'$  (1.778 m)   Wt 80.7 kg   SpO2 98%   BMI 25.54 kg/m  Gen:   Awake, no distress  NAD Resp:  Normal effort CTA MSK:   Moves extremities without difficulty  ABD:  Soft, nontender,mild right flank tenderness  Medical Decision Making  Medically screening exam initiated at 12:05 PM.  Appropriate orders placed.  Kevin Esparza was informed that the remainder of the evaluation will be completed by another provider, this initial triage assessment does not replace that evaluation, and the importance of remaining in the ED until their evaluation is complete.  Patient to the ED for evaluation of right-sided flank pain with posterior headache for the last 7 days.  Denies any fevers, chills, or sweats.   Melvenia Needles, PA-C 06/27/22 1209

## 2022-06-27 NOTE — Discharge Instructions (Addendum)
Your labs and CT scan today were all okay.  Follow a bland diet and stay well-hydrated for the next few days, and take medications as needed for recurrent symptoms.

## 2022-06-27 NOTE — ED Provider Notes (Signed)
[facility] Provider Note    Event Date/Time   First MD Initiated Contact with Patient 06/27/22 1605     (approximate)   History   Chief Complaint: Headache and Flank Pain   HPI  Kevin Esparza is a 61 y.o. male with a history of hypertension CHF diabetes GERD and neuroendocrine carcinoma of the stomach who comes the ED complaining of gradual onset headache for the past week.  Initially was mild and nagging, slowly worsened, constant, waxing waning, no aggravating or alleviating factors.  No photophobia or phonophobia.  He has had headaches like this in the past but has been a long time.  No thunderclap onset, no blurry vision or double vision or paresthesias or motor weakness.  No syncope.  Denies chest pain neck pain or shortness of breath.  He also notes that he has had a persistent cough since having COVID 6 weeks ago.  Also 6 weeks ago he had upper and lower endoscopy.  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  Pathology reports are benign.  Patient also reports some upper abdominal pain which is episodic and mild over the last several weeks.      Physical Exam   Triage Vital Signs: ED Triage Vitals  Enc Vitals Group     BP 06/27/22 1204 (!) 101/45     Pulse Rate 06/27/22 1204 93     Resp 06/27/22 1204 20     Temp 06/27/22 1204 98.4 F (36.9 C)     Temp Source 06/27/22 1204 Oral     SpO2 06/27/22 1204 98 %     Weight 06/27/22 1203 178 lb (80.7 kg)     Height 06/27/22 1203 '5\' 10"'$  (1.778 m)     Head Circumference --      Peak Flow --      Pain Score 06/27/22 1202 10     Pain Loc --      Pain Edu? --      Excl. in [place]? --     Most recent vital signs: Vitals:   06/27/22 1628 06/27/22 1916  BP: 124/74 134/70  Pulse: 68 68  Resp: 16 16  Temp: 98 F (36.7 C)   SpO2:  100% 99%    General: Awake, no distress.  CV:  Good peripheral perfusion.  Regular rate and rhythm. Resp:  Normal effort.  Clear to auscultation bilaterally Abd:  No distention.  Soft with mild epigastric tenderness. Other:  Neuro intact.  Moist mucous membranes.   ED Results / Procedures / Treatments   Labs (all labs ordered are listed, but only abnormal results are displayed) Labs Reviewed  URINALYSIS, ROUTINE W REFLEX MICROSCOPIC - Abnormal; Notable for the following components:      Result Value   Color, Urine YELLOW (*)    APPearance CLEAR (*)    All other components within normal limits  BASIC METABOLIC PANEL - Abnormal; Notable for the following components:   Sodium 134 (*)    Glucose, Bld 138 (*)    BUN 6 (*)    All other components within normal limits  CBC  HEPATIC FUNCTION PANEL  LIPASE, BLOOD     EKG    RADIOLOGY CT head interpreted by me, negative for mass or intracranial hemorrhage.  Radiology report reviewed.  CT abdomen pelvis unremarkable  PROCEDURES:  Procedures   MEDICATIONS ORDERED IN ED: Medications  ketorolac (TORADOL) 15 MG/ML injection 15 mg (15 mg Intramuscular Given 06/27/22 1647)  metoCLOPramide (REGLAN) tablet 10 mg (10 mg Oral Given 06/27/22 1647)  famotidine (PEPCID) tablet 40 mg (40 mg Oral Given 06/27/22 1647)  sucralfate (CARAFATE) tablet 1 g (1 g Oral Given 06/27/22 1647)     IMPRESSION / MDM / ASSESSMENT AND PLAN / ED COURSE  I reviewed the triage vital signs and the nursing notes.                              Differential diagnosis includes, but is not limited to, kidney stone, gastritis, pancreatitis, benign headache syndrome, intracranial hemorrhage, intracranial mass  Patient's presentation is most consistent with acute presentation with potential threat to life or bodily function.  Patient presents with a persistent headache for the past week.  Not thunderclap in onset.  Doubt vascular cause such as cerebral aneurysm  or arterial dissection.  CT head is unremarkable.  After Toradol Reglan Pepcid, headache has resolved.  Patient is ambulatory and tolerating oral intake.  Abdominal pain is benign. Considering the patient's symptoms, medical history, and physical examination today, I have low suspicion for cholecystitis or biliary pathology, pancreatitis, perforation or bowel obstruction, hernia, intra-abdominal abscess, AAA or dissection, volvulus or intussusception, mesenteric ischemia, or appendicitis. Labs are reassuring.  Symptoms improved with antiacid medication.  Likely sequela of his endoscopy findings and procedure.       FINAL CLINICAL IMPRESSION(S) / ED DIAGNOSES   Final diagnoses:  Bad headache  Gastritis without bleeding, unspecified chronicity, unspecified gastritis type     Rx / DC Orders   ED Discharge Orders          Ordered    ketorolac (TORADOL) 10 MG tablet  Every 6 hours PRN        06/27/22 1919    metoCLOPramide (REGLAN) 10 MG tablet  Every 6 hours PRN        06/27/22 1919    famotidine (PEPCID) 20 MG tablet  2 times daily        06/27/22 1919    sucralfate (CARAFATE) 1 g tablet  4 times daily        06/27/22 1919             Note:  This document was prepared using Dragon voice recognition software and may include unintentional dictation errors.   Carrie Mew, MD 06/27/22 1925

## 2022-06-27 NOTE — ED Triage Notes (Signed)
Pt reports headache for 7 days and no relief with OTC meds. Pt also reports pain to his right flank area that moves back to front and is sharp in nature.

## 2022-07-26 ENCOUNTER — Inpatient Hospital Stay
Admission: EM | Admit: 2022-07-26 | Discharge: 2022-07-28 | DRG: 066 | Disposition: A | Discharge status: Home / Self Care | Payer: Medicare HMO | Attending: Osteopathic Medicine | Admitting: Osteopathic Medicine

## 2022-07-26 ENCOUNTER — Emergency Department: Payer: Medicare HMO

## 2022-07-26 DIAGNOSIS — Z8719 Personal history of other diseases of the digestive system: Secondary | ICD-10-CM

## 2022-07-26 DIAGNOSIS — F419 Anxiety disorder, unspecified: Secondary | ICD-10-CM | POA: Diagnosis present

## 2022-07-26 DIAGNOSIS — Z7984 Long term (current) use of oral hypoglycemic drugs: Secondary | ICD-10-CM

## 2022-07-26 DIAGNOSIS — D3A8 Other benign neuroendocrine tumors: Secondary | ICD-10-CM | POA: Diagnosis present

## 2022-07-26 DIAGNOSIS — Z8673 Personal history of transient ischemic attack (TIA), and cerebral infarction without residual deficits: Secondary | ICD-10-CM

## 2022-07-26 DIAGNOSIS — Z7901 Long term (current) use of anticoagulants: Secondary | ICD-10-CM

## 2022-07-26 DIAGNOSIS — Z8249 Family history of ischemic heart disease and other diseases of the circulatory system: Secondary | ICD-10-CM

## 2022-07-26 DIAGNOSIS — K219 Gastro-esophageal reflux disease without esophagitis: Secondary | ICD-10-CM | POA: Diagnosis present

## 2022-07-26 DIAGNOSIS — Z9049 Acquired absence of other specified parts of digestive tract: Secondary | ICD-10-CM

## 2022-07-26 DIAGNOSIS — R29701 NIHSS score 1: Secondary | ICD-10-CM | POA: Diagnosis present

## 2022-07-26 DIAGNOSIS — I4891 Unspecified atrial fibrillation: Secondary | ICD-10-CM | POA: Diagnosis present

## 2022-07-26 DIAGNOSIS — I10 Essential (primary) hypertension: Secondary | ICD-10-CM

## 2022-07-26 DIAGNOSIS — Z800 Family history of malignant neoplasm of digestive organs: Secondary | ICD-10-CM

## 2022-07-26 DIAGNOSIS — R471 Dysarthria and anarthria: Secondary | ICD-10-CM | POA: Diagnosis present

## 2022-07-26 DIAGNOSIS — E785 Hyperlipidemia, unspecified: Secondary | ICD-10-CM | POA: Diagnosis present

## 2022-07-26 DIAGNOSIS — C7A8 Other malignant neuroendocrine tumors: Secondary | ICD-10-CM | POA: Diagnosis present

## 2022-07-26 DIAGNOSIS — I639 Cerebral infarction, unspecified: Secondary | ICD-10-CM | POA: Diagnosis not present

## 2022-07-26 DIAGNOSIS — E119 Type 2 diabetes mellitus without complications: Secondary | ICD-10-CM

## 2022-07-26 DIAGNOSIS — Z833 Family history of diabetes mellitus: Secondary | ICD-10-CM

## 2022-07-26 DIAGNOSIS — I63541 Cerebral infarction due to unspecified occlusion or stenosis of right cerebellar artery: Secondary | ICD-10-CM | POA: Diagnosis not present

## 2022-07-26 DIAGNOSIS — Z79899 Other long term (current) drug therapy: Secondary | ICD-10-CM

## 2022-07-26 DIAGNOSIS — E876 Hypokalemia: Secondary | ICD-10-CM | POA: Diagnosis present

## 2022-07-26 DIAGNOSIS — N400 Enlarged prostate without lower urinary tract symptoms: Secondary | ICD-10-CM | POA: Diagnosis present

## 2022-07-26 DIAGNOSIS — F17220 Nicotine dependence, chewing tobacco, uncomplicated: Secondary | ICD-10-CM | POA: Diagnosis present

## 2022-07-26 LAB — CBC WITH DIFFERENTIAL/PLATELET
Abs Immature Granulocytes: 0.02 10*3/uL (ref 0.00–0.07)
Basophils Absolute: 0 10*3/uL (ref 0.0–0.1)
Basophils Relative: 0 %
Eosinophils Absolute: 0.3 10*3/uL (ref 0.0–0.5)
Eosinophils Relative: 4 %
HCT: 43.4 % (ref 39.0–52.0)
Hemoglobin: 14.7 g/dL (ref 13.0–17.0)
Immature Granulocytes: 0 %
Lymphocytes Relative: 33 %
Lymphs Abs: 2.5 10*3/uL (ref 0.7–4.0)
MCH: 27.3 pg (ref 26.0–34.0)
MCHC: 33.9 g/dL (ref 30.0–36.0)
MCV: 80.7 fL (ref 80.0–100.0)
Monocytes Absolute: 0.6 10*3/uL (ref 0.1–1.0)
Monocytes Relative: 8 %
Neutro Abs: 4.2 10*3/uL (ref 1.7–7.7)
Neutrophils Relative %: 55 %
Platelets: 173 10*3/uL (ref 150–400)
RBC: 5.38 MIL/uL (ref 4.22–5.81)
RDW: 14 % (ref 11.5–15.5)
WBC: 7.7 10*3/uL (ref 4.0–10.5)
nRBC: 0 % (ref 0.0–0.2)

## 2022-07-26 LAB — URINALYSIS, ROUTINE W REFLEX MICROSCOPIC
Bacteria, UA: NONE SEEN
Bilirubin Urine: NEGATIVE
Glucose, UA: NEGATIVE mg/dL
Hgb urine dipstick: NEGATIVE
Ketones, ur: NEGATIVE mg/dL
Leukocytes,Ua: NEGATIVE
Nitrite: NEGATIVE
Protein, ur: NEGATIVE mg/dL
Specific Gravity, Urine: 1.024 (ref 1.005–1.030)
Squamous Epithelial / HPF: NONE SEEN (ref 0–5)
pH: 6 (ref 5.0–8.0)

## 2022-07-26 LAB — COMPREHENSIVE METABOLIC PANEL
ALT: 21 U/L (ref 0–44)
AST: 25 U/L (ref 15–41)
Albumin: 4.1 g/dL (ref 3.5–5.0)
Alkaline Phosphatase: 74 U/L (ref 38–126)
Anion gap: 8 (ref 5–15)
BUN: <5 mg/dL — ABNORMAL LOW (ref 8–23)
CO2: 27 mmol/L (ref 22–32)
Calcium: 9.7 mg/dL (ref 8.9–10.3)
Chloride: 104 mmol/L (ref 98–111)
Creatinine, Ser: 1.06 mg/dL (ref 0.61–1.24)
GFR, Estimated: >60 mL/min (ref >60)
Glucose, Bld: 96 mg/dL (ref 70–99)
Potassium: 3.3 mmol/L — ABNORMAL LOW (ref 3.5–5.1)
Sodium: 139 mmol/L (ref 135–145)
Total Bilirubin: 1 mg/dL (ref 0.3–1.2)
Total Protein: 7.8 g/dL (ref 6.5–8.1)

## 2022-07-26 LAB — PROTIME-INR
INR: 1.1 (ref 0.8–1.2)
Prothrombin Time: 14.2 seconds (ref 11.4–15.2)

## 2022-07-26 MED ORDER — IOHEXOL 350 MG/ML SOLN
75.0000 mL | Freq: Once | INTRAVENOUS | Status: AC | PRN
  Administered 2022-07-26: 75 mL via INTRAVENOUS

## 2022-07-26 MED ORDER — HYDROCODONE-ACETAMINOPHEN 5-325 MG PO TABS
2.0000 | ORAL_TABLET | Freq: Once | ORAL | Status: AC
  Administered 2022-07-26: 2 via ORAL
  Filled 2022-07-26: qty 2

## 2022-07-26 MED ORDER — ASPIRIN 81 MG PO CHEW
324.0000 mg | CHEWABLE_TABLET | Freq: Once | ORAL | Status: AC
  Administered 2022-07-26: 324 mg via ORAL
  Filled 2022-07-26: qty 4

## 2022-07-26 NOTE — ED Provider Notes (Signed)
[facility] Provider Note     History   Headache  HPI  Kevin Esparza is a 61 y.o. male with a history of neuroendocrine carcinoma, congestive heart failure diabetes hypertension atrial fibrillation on anticoagulation   Patient reports for several days now he has noticed his balance feels off, he is also been having intermittent headaches that are relieved by taking 2 hydrocodone tablets at home but he has since run out.  He is also been having some issues with his memory, and also reports he had a fairly severe headache about a month ago and was seen at the ER  Today he had an MRI at [facility], and was called and instructed to go to the emergency department for further evaluation  Reports he is compliant with his use of apixaban and last dose was taken this morning  Physical Exam   Triage Vital Signs: ED Triage Vitals  Enc Vitals Group     BP 07/26/22 1641 (!) 178/98     Pulse Rate 07/26/22 1641 73     Resp 07/26/22 1641 17     Temp 07/26/22 1641 98.3 F (36.8 C)     Temp Source 07/26/22 1641 Oral     SpO2 07/26/22 1641 100 %     Weight --      Height --      Head Circumference --      Peak Flow --      Pain Score 07/26/22 1642 6     Pain Loc --      Pain Edu? --      Excl. in [place]? --     Most recent vital signs: Vitals:   07/26/22 1641  BP: (!) 178/98  Pulse: 73  Resp: 17  Temp: 98.3 F (36.8 C)  SpO2: 100%     General: Awake, no distress.  Sitting up on the edge of the bed without difficulty.  Very pleasant.  Speech is clear..  Nerve exam appears to be normal without gross abnormality.  Moves all extremities well without difficulty.  He does however ask occasional repetitive questions and he and his wife both feel as though his memory has been abnormal recently. CV:  Good peripheral perfusion.  Normal sounds Resp:  Normal effort.  Abd:  No distention.  Other:  Moves all extremities well.  No obvious deficits.   ED Results /  Procedures / Treatments   Labs (all labs ordered are listed, but only abnormal results are displayed) Labs Reviewed  COMPREHENSIVE METABOLIC PANEL - Abnormal; Notable for the following components:      Result Value   Potassium 3.3 (*)    BUN <5 (*)    All other components within normal limits  URINALYSIS, ROUTINE W REFLEX MICROSCOPIC - Abnormal; Notable for the following components:   Color, Urine STRAW (*)    APPearance CLEAR (*)    All other components within normal limits  CBC WITH DIFFERENTIAL/PLATELET  PROTIME-INR     EKG  EKG interpreted at 12:05 Heart rate 60 QRS 80 QTc 420 Normal sinus rhythm, no evidence of acute ischemia   RADIOLOGY CT ANGIO HEAD NECK W WO CM  Result Date: 07/26/2022 CLINICAL DATA:  Provided history: Neuro deficit, acute, stroke suspected. EXAM: CT ANGIOGRAPHY HEAD AND NECK TECHNIQUE: Multidetector CT imaging of the head and neck was performed using the standard protocol during bolus administration of intravenous contrast. Multiplanar CT image reconstructions and MIPs were obtained to evaluate the vascular anatomy. Carotid  stenosis measurements (when applicable) are obtained utilizing NASCET criteria, using the distal internal carotid diameter as the denominator. RADIATION DOSE REDUCTION: This exam was performed according to the departmental dose-optimization program which includes automated exposure control, adjustment of the mA and/or kV according to patient size and/or use of iterative reconstruction technique. CONTRAST:  31m OMNIPAQUE IOHEXOL 350 MG/ML SOLN COMPARISON:  Head CT 06/27/2022. FINDINGS: CT HEAD FINDINGS Brain: No age advanced or lobar predominant parenchymal atrophy. Small infarct within the right cerebellar hemisphere, new from the prior head CT of 06/27/2022, likely acute or subacute. There is no acute intracranial hemorrhage. No demarcated cortical infarct. No extra-axial fluid collection. No evidence of an intracranial mass. No midline  shift. Vascular: No hyperdense vessel.  Atherosclerotic calcifications. Skull: No fracture or aggressive osseous lesion. Sinuses/Orbits: No mass or acute finding within the imaged orbits. Mild mucosal thickening within the bilateral frontal sinuses. Mild mucosal thickening and fluid within the left ethmoid air cells. Review of the MIP images confirms the above findings CTA NECK FINDINGS Aortic arch: Common origin of the innominate and left common carotid arteries. Atherosclerotic plaque within the visualized aortic arch and proximal major branch vessels of the neck. Streak and beam hardening artifact arising from a dense right-sided contrast bolus partially obscures the right subclavian artery. Within this limitation, there is no appreciable hemodynamically significant innominate or proximal subclavian artery stenosis. Right carotid system: CCA and ICA patent within the neck without stenosis. Minimal atherosclerotic plaque about the carotid bifurcation. Left carotid system: CCA and ICA patent within the neck without stenosis. Mild atherosclerotic plaque about the carotid bifurcation. Vertebral arteries: Patent within the neck without stenosis or significant atherosclerotic disease. The right left vertebral artery is dominant. Skeleton: Spondylosis. C4-C5 vertebral body ankylosis. No acute fracture or aggressive osseous lesion. Other neck: 4 mm nodule within the right thyroid lobe, not meeting consensus criteria for ultrasound follow-up based on size. No follow-up imaging is recommended. Reference: J Am Coll Radiol. 2015 Feb;12(2): 143-50. Upper chest: No consolidation within the imaged lung apices. Review of the MIP images confirms the above findings CTA HEAD FINDINGS Anterior circulation: The intracranial internal carotid arteries are patent. Nonstenotic atherosclerotic plaque within both vessels. The M1 middle cerebral arteries are patent. No M2 proximal branch occlusion or high-grade proximal stenosis. The  anterior cerebral arteries are patent. No intracranial aneurysm is identified. Posterior circulation: Patent non dominant intracranial right vertebral artery, which terminates predominantly or entirely as the right PICA. The dominant intracranial left vertebral artery is patent without stenosis. The basilar artery is patent. The posterior cerebral arteries are patent. Posterior communicating arteries are diminutive or absent, bilaterally. Venous sinuses: Within the limitations of contrast timing, no convincing thrombus. Anatomic variants: As described. Review of the MIP images confirms the above findings IMPRESSION: CT head: 1. Small infarct within the right cerebellar hemisphere, new from the prior head CT of 06/27/2022 and likely acute/subacute. 2. Paranasal sinus disease, as described. CTA neck: The common carotid, internal carotid and vertebral arteries are patent within the neck without stenosis. Mild atherosclerotic plaque about both carotid bifurcations. CTA head: 1. No intracranial large vessel occlusion or proximal high-grade arterial stenosis identified. 2. Nonstenotic atherosclerotic plaque within the intracranial ICAs, bilaterally. Electronically Signed   By: KKellie SimmeringD.O.   On: 07/26/2022 17:59         Care Everywhere MRI Brain notes: "Small acute or subacute right cerebellar infarct.   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 Recommend CTA or MRA for complete evaluation.  78m tiny enhancement is seen at the periphery of right inferior cerebellar infarction, which may be due to subacute infarction. However, metastasis cannot be excluded given the history of gastric neuroendocrine tumor. Recommend MRI with contrast follow-up in 6-8 weeks.  A 251menhancing nodule in the right parietal parietal lobe cortex, which is indeterminate but also suspicious for metastasis.   The finding of an acute to subacute right  cerebellar infarct was discussed via telephone with RN JeJanett Billowpoku by Dr. JaLonna Cobbn 07/26/2022 1:45 PM."  PROCEDURES:  Critical Care performed: No  Procedures   MEDICATIONS ORDERED IN ED: Medications  iohexol (OMNIPAQUE) 350 MG/ML injection 75 mL (75 mLs Intravenous Contrast Given 07/26/22 1728)  aspirin chewable tablet 324 mg (324 mg Oral Given 07/26/22 2226)  HYDROcodone-acetaminophen (NORCO/VICODIN) 5-325 MG per tablet 2 tablet (2 tablets Oral Given 07/26/22 2324)     IMPRESSION / MDM / ASSESSMENT AND PLAN / ED COURSE  I reviewed the triage vital signs and the nursing notes.                              Differential diagnosis includes, but is not limited to, acute stroke, likely ischemic in nature based on today's imaging, aneurysm, metastatic due to history of neuroendocrine tumor, or other central neurologic causes are all considered.  Patient is afebrile.  No meningeal symptoms.  Fully alert well oriented.  Currently does not display any obvious strokelike symptomatology except he does report that he has had some difficulty with balancing and headaches intermittently for about the last several days.  He also reports that he is having some short-term memory issues, and his wife affirms this reports for about a week or 2 now he will frequently seem to forget things easily   Based on the patient's clinical history somewhat suspicious he may have experienced some type of a stroke or acute neurologic event about a month ago, but also some elements of symptoms such as dizziness and difficulty balancing and feeling as though he is going to fall over when standing up have been present for a couple of days now.  He is currently anticoagulated has a history of atrial fibrillation.  CT angiography today does not show any severe stenosis.  MRI showed a question of possible aneurysm, but CT angiography here today not demonstrate aneurysm.  Suspect based on his clinical history and symptoms that  this is unlikely to represent an acute aneurysm with rupture but does have evidence that supports an acute to subacute cerebellar infarct.  Certainly the patient needs further evaluation, minimization for stroke risk factors, embolic work-up, and neurology consult.  We will place patient on aspirin and also provide hydrocodone for relief of headache which she has been experiencing now for off-and-on a month  Patient's presentation is most consistent with acute presentation with potential threat to life or bodily function.  ----------------------------------------- 11:55 PM on 07/26/2022 ----------------------------------------- Patient agreeable as long with along with his wife and understand plan for admission.  I have consulted with her hospitalist Dr. DuDamita Dunningsho is excepting of patient admission for further work-up with anticipation of neurology consultation for concerns of acute to subacute cerebellar infarct      FINAL CLINICAL IMPRESSION(S) / ED DIAGNOSES   Final diagnoses:  Cerebral infarction, unspecified mechanism ([facility] [place]    Rx / DC Orders   ED Discharge Orders  None        Note:  This document was prepared using Dragon voice recognition software and may include unintentional dictation errors.   Delman Kitten, MD 07/27/22 [phone]

## 2022-07-26 NOTE — ED Triage Notes (Signed)
Pt presents to ED with /co of having an abnormal MRI. Pt states he had the MRI due to headaches. Pt states that is his only complaint at this time, pt hypertensive and states he did take his BP meds here today. Pt is A&Ox4.

## 2022-07-26 NOTE — ED Provider Triage Note (Signed)
Emergency Medicine Provider Triage Evaluation Note  Kevin Esparza , a 61 y.o. male  was evaluated in triage.  Pt complains of headache, short-term memory issues, abnormal MRI.  Patient has been having daily headaches for the last month.  No change in today's headache from previous.  No unilateral weakness, slurred speech.  Patient had an outpatient MRI performed at [facility] that revealed acute versus subacute ischemic changes as well as a possible aneurysm.  Patient presents for further evaluation.  Headaches, memory changes have been ongoing for at least a month..  Review of Systems  Positive: Headache, abnormal MRI Negative: Vision changes, unilateral weakness, slurred speech  Physical Exam  BP (!) 178/98   Pulse 73   Temp 98.3 F (36.8 C) (Oral)   Resp 17   SpO2 100%  Gen:   Awake, no distress   Resp:  Normal effort  MSK:   Moves extremities without difficulty  Other:    Medical Decision Making  Medically screening exam initiated at 4:49 PM.  Appropriate orders placed.  [name] was informed that the remainder of the evaluation will be completed by another provider, this initial triage assessment does not replace that evaluation, and the importance of remaining in the ED until their evaluation is complete.  Patient has been having daily headaches for the past month with some short-term memory changes.  Patient had an outpatient MRI today at [facility] concerning for acute versus subacute ischemic changes and possible aneurysm.  Recommended CTA versus MRA for further evaluation.  Patient will have labs, CTA at this time.  No new symptoms, no change in symptoms, will not initiate code stroke at this time.   [name], PA-C 07/26/22 1651

## 2022-07-26 NOTE — ED Notes (Signed)
Pt has a headache for 10 days.  Intermittent dizziness.  No n/v/d.  Pt alert speech clear.  Pt in hallway bed for admission.

## 2022-07-27 ENCOUNTER — Inpatient Hospital Stay (HOSPITAL_COMMUNITY)
Admit: 2022-07-27 | Discharge: 2022-07-27 | Disposition: A | Discharge status: Home / Self Care | Payer: Medicare HMO | Attending: Internal Medicine | Admitting: Internal Medicine

## 2022-07-27 ENCOUNTER — Other Ambulatory Visit: Payer: Self-pay

## 2022-07-27 ENCOUNTER — Encounter: Payer: Self-pay | Admitting: Internal Medicine

## 2022-07-27 DIAGNOSIS — Z8673 Personal history of transient ischemic attack (TIA), and cerebral infarction without residual deficits: Secondary | ICD-10-CM | POA: Diagnosis not present

## 2022-07-27 DIAGNOSIS — I6389 Other cerebral infarction: Secondary | ICD-10-CM

## 2022-07-27 DIAGNOSIS — Z79899 Other long term (current) drug therapy: Secondary | ICD-10-CM | POA: Diagnosis not present

## 2022-07-27 DIAGNOSIS — K219 Gastro-esophageal reflux disease without esophagitis: Secondary | ICD-10-CM | POA: Diagnosis present

## 2022-07-27 DIAGNOSIS — R29701 NIHSS score 1: Secondary | ICD-10-CM | POA: Diagnosis present

## 2022-07-27 DIAGNOSIS — E876 Hypokalemia: Secondary | ICD-10-CM | POA: Diagnosis present

## 2022-07-27 DIAGNOSIS — Z9049 Acquired absence of other specified parts of digestive tract: Secondary | ICD-10-CM | POA: Diagnosis not present

## 2022-07-27 DIAGNOSIS — Z800 Family history of malignant neoplasm of digestive organs: Secondary | ICD-10-CM | POA: Diagnosis not present

## 2022-07-27 DIAGNOSIS — I480 Paroxysmal atrial fibrillation: Secondary | ICD-10-CM | POA: Diagnosis not present

## 2022-07-27 DIAGNOSIS — I639 Cerebral infarction, unspecified: Secondary | ICD-10-CM | POA: Diagnosis present

## 2022-07-27 DIAGNOSIS — R471 Dysarthria and anarthria: Secondary | ICD-10-CM | POA: Diagnosis present

## 2022-07-27 DIAGNOSIS — N400 Enlarged prostate without lower urinary tract symptoms: Secondary | ICD-10-CM | POA: Diagnosis present

## 2022-07-27 DIAGNOSIS — Z833 Family history of diabetes mellitus: Secondary | ICD-10-CM | POA: Diagnosis not present

## 2022-07-27 DIAGNOSIS — R9389 Abnormal findings on diagnostic imaging of other specified body structures: Secondary | ICD-10-CM | POA: Diagnosis not present

## 2022-07-27 DIAGNOSIS — Z8249 Family history of ischemic heart disease and other diseases of the circulatory system: Secondary | ICD-10-CM | POA: Diagnosis not present

## 2022-07-27 DIAGNOSIS — E119 Type 2 diabetes mellitus without complications: Secondary | ICD-10-CM | POA: Diagnosis present

## 2022-07-27 DIAGNOSIS — F419 Anxiety disorder, unspecified: Secondary | ICD-10-CM | POA: Diagnosis present

## 2022-07-27 DIAGNOSIS — Z7984 Long term (current) use of oral hypoglycemic drugs: Secondary | ICD-10-CM | POA: Diagnosis not present

## 2022-07-27 DIAGNOSIS — F17220 Nicotine dependence, chewing tobacco, uncomplicated: Secondary | ICD-10-CM | POA: Diagnosis present

## 2022-07-27 DIAGNOSIS — Z8719 Personal history of other diseases of the digestive system: Secondary | ICD-10-CM | POA: Diagnosis not present

## 2022-07-27 DIAGNOSIS — I63541 Cerebral infarction due to unspecified occlusion or stenosis of right cerebellar artery: Secondary | ICD-10-CM | POA: Diagnosis present

## 2022-07-27 DIAGNOSIS — E785 Hyperlipidemia, unspecified: Secondary | ICD-10-CM | POA: Diagnosis present

## 2022-07-27 DIAGNOSIS — I4891 Unspecified atrial fibrillation: Secondary | ICD-10-CM | POA: Diagnosis present

## 2022-07-27 DIAGNOSIS — D3A8 Other benign neuroendocrine tumors: Secondary | ICD-10-CM | POA: Diagnosis present

## 2022-07-27 DIAGNOSIS — Z7901 Long term (current) use of anticoagulants: Secondary | ICD-10-CM | POA: Diagnosis not present

## 2022-07-27 DIAGNOSIS — C7A8 Other malignant neuroendocrine tumors: Secondary | ICD-10-CM | POA: Diagnosis not present

## 2022-07-27 DIAGNOSIS — I10 Essential (primary) hypertension: Secondary | ICD-10-CM | POA: Diagnosis present

## 2022-07-27 LAB — LIPID PANEL
Cholesterol: 171 mg/dL (ref 0–200)
HDL: 26 mg/dL — ABNORMAL LOW (ref >40)
LDL Cholesterol: 101 mg/dL — ABNORMAL HIGH (ref 0–99)
Total CHOL/HDL Ratio: 6.6 RATIO
Triglycerides: 221 mg/dL — ABNORMAL HIGH (ref <150)
VLDL: 44 mg/dL — ABNORMAL HIGH (ref 0–40)

## 2022-07-27 LAB — ECHOCARDIOGRAM COMPLETE
AR max vel: 2.35 cm2
AV Area VTI: 2.34 cm2
AV Area mean vel: 2.34 cm2
AV Mean grad: 3 mmHg
AV Peak grad: 4.7 mmHg
Ao pk vel: 1.08 m/s
Area-P 1/2: 4.06 cm2
P 1/2 time: 660 msec
S' Lateral: 2.9 cm

## 2022-07-27 LAB — GLUCOSE, CAPILLARY: Glucose-Capillary: 114 mg/dL — ABNORMAL HIGH (ref 70–99)

## 2022-07-27 LAB — CBG MONITORING, ED
Glucose-Capillary: 109 mg/dL — ABNORMAL HIGH (ref 70–99)
Glucose-Capillary: 121 mg/dL — ABNORMAL HIGH (ref 70–99)
Glucose-Capillary: 111 mg/dL — ABNORMAL HIGH (ref 70–99)
Glucose-Capillary: 102 mg/dL — ABNORMAL HIGH (ref 70–99)

## 2022-07-27 MED ORDER — POTASSIUM CHLORIDE CRYS ER 20 MEQ PO TBCR
40.0000 meq | EXTENDED_RELEASE_TABLET | Freq: Once | ORAL | Status: AC
  Administered 2022-07-27: 40 meq via ORAL
  Filled 2022-07-27: qty 2

## 2022-07-27 MED ORDER — ACETAMINOPHEN 325 MG PO TABS
650.0000 mg | ORAL_TABLET | ORAL | Status: DC | PRN
  Administered 2022-07-27: 650 mg via ORAL
  Filled 2022-07-27: qty 2

## 2022-07-27 MED ORDER — CYCLOBENZAPRINE HCL 10 MG PO TABS: 10.0000 mg | ORAL_TABLET | Freq: Three times a day (TID) | ORAL | Status: DC | PRN

## 2022-07-27 MED ORDER — MAGNESIUM OXIDE -MG SUPPLEMENT 400 (240 MG) MG PO TABS
400.0000 mg | ORAL_TABLET | Freq: Two times a day (BID) | ORAL | Status: DC
  Administered 2022-07-27 – 2022-07-28 (×2): 400 mg via ORAL
  Filled 2022-07-27 (×2): qty 1

## 2022-07-27 MED ORDER — TAMSULOSIN HCL 0.4 MG PO CAPS
0.4000 mg | ORAL_CAPSULE | Freq: Every day | ORAL | Status: DC
  Administered 2022-07-27 – 2022-07-28 (×2): 0.4 mg via ORAL
  Filled 2022-07-27 (×2): qty 1

## 2022-07-27 MED ORDER — HYDROCODONE-ACETAMINOPHEN 5-325 MG PO TABS
1.0000 | ORAL_TABLET | Freq: Once | ORAL | Status: AC
  Administered 2022-07-27: 1 via ORAL
  Filled 2022-07-27: qty 1

## 2022-07-27 MED ORDER — ENALAPRIL MALEATE 10 MG PO TABS
10.0000 mg | ORAL_TABLET | Freq: Every day | ORAL | Status: DC
  Administered 2022-07-27 – 2022-07-28 (×2): 10 mg via ORAL
  Filled 2022-07-27 (×2): qty 1

## 2022-07-27 MED ORDER — ALBUTEROL SULFATE (2.5 MG/3ML) 0.083% IN NEBU: 3.0000 mL | INHALATION_SOLUTION | Freq: Four times a day (QID) | RESPIRATORY_TRACT | Status: DC | PRN

## 2022-07-27 MED ORDER — HYDRALAZINE HCL 20 MG/ML IJ SOLN
10.0000 mg | Freq: Once | INTRAMUSCULAR | Status: AC
  Administered 2022-07-27: 10 mg via INTRAVENOUS
  Filled 2022-07-27: qty 1

## 2022-07-27 MED ORDER — HYDROCODONE-ACETAMINOPHEN 5-325 MG PO TABS
1.0000 | ORAL_TABLET | Freq: Four times a day (QID) | ORAL | Status: DC | PRN
  Administered 2022-07-27 – 2022-07-28 (×3): 1 via ORAL
  Filled 2022-07-27 (×3): qty 1

## 2022-07-27 MED ORDER — INSULIN ASPART 100 UNIT/ML IJ SOLN
0.0000 [IU] | Freq: Three times a day (TID) | INTRAMUSCULAR | Status: DC
  Administered 2022-07-28: 2 [IU] via SUBCUTANEOUS
  Filled 2022-07-27: qty 1

## 2022-07-27 MED ORDER — APIXABAN 5 MG PO TABS: 5.0000 mg | ORAL_TABLET | Freq: Two times a day (BID) | ORAL | Status: DC

## 2022-07-27 MED ORDER — ACETAMINOPHEN 650 MG RE SUPP: 650.0000 mg | RECTAL | Status: DC | PRN

## 2022-07-27 MED ORDER — HYDRALAZINE HCL 20 MG/ML IJ SOLN: 5.0000 mg | Freq: Once | INTRAMUSCULAR | Status: DC

## 2022-07-27 MED ORDER — SODIUM CHLORIDE 0.9 % IV SOLN: INTRAVENOUS | Status: DC

## 2022-07-27 MED ORDER — FAMOTIDINE 20 MG PO TABS
20.0000 mg | ORAL_TABLET | Freq: Two times a day (BID) | ORAL | Status: DC
  Administered 2022-07-27 – 2022-07-28 (×2): 20 mg via ORAL
  Filled 2022-07-27 (×2): qty 1

## 2022-07-27 MED ORDER — DICLOFENAC SODIUM 1 % EX GEL: 2.0000 g | Freq: Every day | CUTANEOUS | Status: DC | PRN

## 2022-07-27 MED ORDER — STROKE: EARLY STAGES OF RECOVERY BOOK: Freq: Once | Status: AC

## 2022-07-27 MED ORDER — ACETAMINOPHEN 160 MG/5ML PO SOLN: 650.0000 mg | ORAL | Status: DC | PRN

## 2022-07-27 MED ORDER — ATORVASTATIN CALCIUM 20 MG PO TABS
40.0000 mg | ORAL_TABLET | Freq: Every day | ORAL | Status: DC
  Administered 2022-07-27 – 2022-07-28 (×2): 40 mg via ORAL
  Filled 2022-07-27 (×2): qty 2

## 2022-07-27 MED ORDER — GABAPENTIN 300 MG PO CAPS
300.0000 mg | ORAL_CAPSULE | Freq: Every day | ORAL | Status: DC
  Administered 2022-07-27: 300 mg via ORAL
  Filled 2022-07-27: qty 1

## 2022-07-27 MED ORDER — INSULIN ASPART 100 UNIT/ML IJ SOLN: 0.0000 [IU] | Freq: Every day | INTRAMUSCULAR | Status: DC

## 2022-07-27 MED ORDER — HYDRALAZINE HCL 20 MG/ML IJ SOLN
10.0000 mg | INTRAMUSCULAR | Status: DC | PRN
  Administered 2022-07-27: 10 mg via INTRAVENOUS
  Filled 2022-07-27: qty 1

## 2022-07-27 MED ORDER — METOPROLOL TARTRATE 50 MG PO TABS
50.0000 mg | ORAL_TABLET | Freq: Two times a day (BID) | ORAL | Status: DC
  Administered 2022-07-27 – 2022-07-28 (×3): 50 mg via ORAL
  Filled 2022-07-27 (×3): qty 1

## 2022-07-27 NOTE — Plan of Care (Signed)
Neurology brief note  61 yo man with medical history significant for Neuroendocrine carcinoma, HTN, DM, former smoker, hospitalized in May with pericarditis and found to be in A-fib, currently on Eliquis who presents to the ED for headaches and gait instability x1 wk. He went to outpatient PCP who ordered MRI to r/o central etiology for imbalance. Outside MRI per report (images not available for my review) showed subacute cerebellar stroke. Vascular imaging was not performed but MRI interpretation noted possible 52m sacular aneurysm R AICA. CTA H&N here showed no e/o aneurysm. I have requested the images from original MRI be powershared to our system for my review. Plan to hold eliquis until I review actual images tomorrow. Given sx onset >24 hrs no indication for permissive HTN. No indication for antiplatelet at this time unless eliquis is not restarted tmrw. Full consult note to follow.  CSu Monks MD [facility] [phone] If 7pm- 7am, please page neurology on call as listed in [place]

## 2022-07-27 NOTE — Evaluation (Signed)
Speech Language Pathology Evaluation Patient Details Name: Mithran Strike MRN: [id number] DOB: 01-07-1961 Today's Date: 07/27/2022 Time: 0940-1003 SLP Time Calculation (min) (ACUTE ONLY): 23 min  Problem List:  Patient Active Problem List   Diagnosis Date Noted   Cerebellar stroke, acute ([place]) 07/27/2022   Atrial fibrillation ([place]) 05/06/2022   Coronary artery calcification 05/06/2022   Pericarditis 03/26/2022   Hyperlipidemia 03/26/2022   Diabetes mellitus, type 2 ([place]) 03/26/2022   Chest pain, rule out acute myocardial infarction 03/24/2022   HTN (hypertension)    Adrenal nodule ([place]) 05/13/2021   Goals of care, counseling/discussion 05/12/2021   Neuroendocrine neoplasm of gastrointestinal tract 02/07/2021   History of colon polyps 02/07/2021   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   Abnormal colonoscopy 02/07/2021   Polyp of colon 01/22/2021   Other dysphagia    Gastric nodule    Schatzki's ring    Colon cancer screening    Polyp of sigmoid colon    Cecal polyp    Adenomatous polyp of ascending colon    Adenomatous polyp of transverse colon    Adenomatous polyp of descending colon    Fracture of phalanx of finger 01/19/2018   Sebaceous cyst 12/06/2017   Past Medical History:  Past Medical History:  Diagnosis Date   Anxiety    Arthritis    knees, hands   Cancer (HCC)    CHF (congestive heart failure) (HCC)    Diabetes mellitus without complication ([place])    type 2   ED (erectile dysfunction)    Elevated PSA    GERD (gastroesophageal reflux disease)    Heartburn    History of BPH    HLD (hyperlipidemia)    HTN (hypertension)    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   Pneumonia    several times   Past Surgical History:  Past Surgical History:  Procedure Laterality Date   APPENDECTOMY  2005   BIOPSY  03/19/2021   Procedure: BIOPSY;  Surgeon: Irving Copas., MD;  Location: [facility] ENDOSCOPY;  Service: Gastroenterology;;    BIOPSY  05/13/2022   Procedure: BIOPSY;  Surgeon: Irving Copas., MD;  Location: [facility] ENDOSCOPY;  Service: Gastroenterology;;   CERVICAL SPINE SURGERY  1997   c4-c7   CHOLECYSTECTOMY  2005   COLONOSCOPY WITH PROPOFOL N/A 12/31/2020   Procedure: COLONOSCOPY WITH PROPOFOL;  Surgeon: Virgel Manifold, MD;  Location: [facility] ENDOSCOPY;  Service: Endoscopy;  Laterality: N/A;   COLONOSCOPY WITH PROPOFOL N/A 03/19/2021   Procedure: COLONOSCOPY WITH PROPOFOL;  Surgeon: Rush Landmark Telford Nab., MD;  Location: [place];  Service: Gastroenterology;  Laterality: N/A;   COLONOSCOPY WITH PROPOFOL N/A 05/13/2022   Procedure: COLONOSCOPY WITH PROPOFOL;  Surgeon: Rush Landmark Telford Nab., MD;  Location: [place];  Service: Gastroenterology;  Laterality: N/A;   ENDOSCOPIC MUCOSAL RESECTION  03/19/2021   Procedure: ENDOSCOPIC MUCOSAL RESECTION;  Surgeon: Rush Landmark Telford Nab., MD;  Location: [place];  Service: Gastroenterology;;   ENDOSCOPIC MUCOSAL RESECTION N/A 05/13/2022   Procedure: ENDOSCOPIC MUCOSAL RESECTION;  Surgeon: Irving Copas., MD;  Location: [place];  Service: Gastroenterology;  Laterality: N/A;   ESOPHAGOGASTRODUODENOSCOPY (EGD) WITH PROPOFOL N/A 12/31/2020   Procedure: ESOPHAGOGASTRODUODENOSCOPY (EGD) WITH PROPOFOL;  Surgeon: Virgel Manifold, MD;  Location: [facility] ENDOSCOPY;  Service: Endoscopy;  Laterality: N/A;   ESOPHAGOGASTRODUODENOSCOPY (EGD) WITH PROPOFOL N/A 03/19/2021   Procedure: ESOPHAGOGASTRODUODENOSCOPY (EGD) WITH PROPOFOL;  Surgeon: Rush Landmark Telford Nab., MD;  Location: [place];  Service: Gastroenterology;  Laterality: N/A;   ESOPHAGOGASTRODUODENOSCOPY (EGD) WITH PROPOFOL N/A 05/13/2022  Procedure: ESOPHAGOGASTRODUODENOSCOPY (EGD) WITH PROPOFOL;  Surgeon: Rush Landmark Telford Nab., MD;  Location: [place];  Service: Gastroenterology;  Laterality: N/A;   HEMOSTASIS CLIP PLACEMENT  03/19/2021   Procedure: HEMOSTASIS CLIP PLACEMENT;  Surgeon:  Irving Copas., MD;  Location: [place];  Service: Gastroenterology;;   HEMOSTASIS CLIP PLACEMENT  05/13/2022   Procedure: HEMOSTASIS CLIP PLACEMENT;  Surgeon: Irving Copas., MD;  Location: [place];  Service: Gastroenterology;;   MULTIPLE TOOTH EXTRACTIONS     dentures   POLYPECTOMY  03/19/2021   Procedure: POLYPECTOMY;  Surgeon: Irving Copas., MD;  Location: [place];  Service: Gastroenterology;;   POLYPECTOMY  05/13/2022   Procedure: POLYPECTOMY;  Surgeon: Irving Copas., MD;  Location: [place];  Service: Gastroenterology;;  EGD and colon   SUBMUCOSAL LIFTING INJECTION  03/19/2021   Procedure: SUBMUCOSAL LIFTING INJECTION;  Surgeon: Irving Copas., MD;  Location: [place];  Service: Gastroenterology;;   SUBMUCOSAL LIFTING INJECTION  05/13/2022   Procedure: SUBMUCOSAL LIFTING INJECTION;  Surgeon: Irving Copas., MD;  Location: [place];  Service: Gastroenterology;;   SUBMUCOSAL TATTOO INJECTION  05/13/2022   Procedure: SUBMUCOSAL TATTOO INJECTION;  Surgeon: Irving Copas., MD;  Location: [place];  Service: Gastroenterology;;   TONSILLECTOMY     UPPER ESOPHAGEAL ENDOSCOPIC ULTRASOUND (EUS) N/A 03/19/2021   Procedure: UPPER ESOPHAGEAL ENDOSCOPIC ULTRASOUND (EUS);  Surgeon: Irving Copas., MD;  Location: [place];  Service: Gastroenterology;  Laterality: N/A;   HPI:  Lexington Krotz is a 61 y.o. male with medical history significant for Neuroendocrine carcinoma, HTN, DM, former smoker, hospitalized in May with pericarditis and found to be in A-fib, currently on Eliquis who presents to the ED for headaches.  Patient had an MRI at [facility] for evaluation of his headache and was instructed to present to the ED for further evaluation.  Patient started having intermittent headaches about a month ago and had been taking hydrocodone.  Over the past several days however he developed balance issues,  feeling like he was going to fall backwards whenever he stood up as well as memory problems and that he was asking repetitive questions. 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 Recommend CTA or MRA for complete evaluation, 69m tiny enhancement is seen at the periphery of right inferior cerebellar infarction, which may be due to subacute infarction. However, metastasis cannot be excluded given the history of gastric neuroendocrine tumor. Recommend MRI with contrast follow-up in 6-8 weeks. A 277menhancing nodule in the right parietal parietal lobe cortex, which is indeterminate but also suspicious for metastasis.   Assessment / Plan / Recommendation Clinical Impression  Given pt's report of acute cognitive changes, pt was tested for cerebellar cognitive affective syndrome (CCAS; Schmahmann's syndrome) using the CCAS-Scale.   CCAS; Schmahmann's syndrome is characterized by deficits in executive function, linguistic processing, spatial cognition, and affect regulation.        The scale contains the following ten tests: semantic fluency (language, animal category), phonemic fluency (language, words that start with the letter F), category switching (executive functions--cognitive flexibility), digit span forward (attention and working memory), digit span backward (working memory), cube draw and copy (visuospatial and visuoconstructive abilities), delayed verbal recall (verbal long-term memory), similarities (executive functions--abstract reasoning), go/no-go (executive functions--response inhibition), and affect regulation.        Each test within the scale has a threshold score allowing  a pass/fail determination as well as a total raw score that facilitates a more granular analysis of patient performance.  Using the pass/fail scoring 1 failed test   possible CCAS; 2 failed tests =Probable CCAS; 3 or more failed tests = definite CCAS.  The total raw score yields a sum 0 (severe) and 120 (normal).      Pt failed 3 tests indicating definite CCAS and his total raw score was 66 out of 120. As such recommend Outpatient ST services.     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  Recommendations may be updated based on patient status, additional functional criteria and insurance authorization.    Follow Up Recommendations  Outpatient SLP    Assistance Recommended at Discharge  Intermittent Supervision/Assistance  Functional Status Assessment Patient has had a recent decline in their functional status and/or demonstrates limited ability to make significant improvements in function in a reasonable and predictable amount of time        SLP Evaluation Cognition  Overall Cognitive Status: Impaired/Different from baseline Arousal/Alertness: Awake/alert Orientation Level: Oriented X4 Attention: Sustained Sustained Attention: Impaired Sustained Attention Impairment: Verbal basic;Functional basic Memory: Impaired Memory Impairment: Decreased recall of new information;Decreased short term memory Decreased Short Term Memory: Verbal complex;Functional complex Awareness: Appears intact Problem Solving: Impaired Problem Solving Impairment: Verbal complex;Functional complex Safety/Judgment: Appears intact       Comprehension  Auditory Comprehension Overall Auditory Comprehension: Appears within functional limits for tasks assessed    Expression Expression Primary Mode of Expression: Verbal Verbal  Expression Overall Verbal Expression: Appears within functional limits for tasks assessed Written Expression Dominant Hand: Right Written Expression: Within Functional Limits   Oral / Motor  Oral Motor/Sensory Function Overall Oral Motor/Sensory Function: Within functional limits Motor Speech Overall Motor Speech: Appears within functional limits for tasks assessed   Zakya Halabi B. Rutherford Nail, M.S., CCC-SLP, Mining engineer Certified Brain Injury [place]  [place] Office [phone] Ascom [phone] Fax [phone]

## 2022-07-27 NOTE — Progress Notes (Signed)
       CROSS COVER NOTE  NAME: Kevin Esparza MRN: [id number] DOB : 17-Dec-1960    Date of Service   07/27/2022   HPI/Events of Note   Medication request received for patient report of 6/10 headache refractory to tylenol, he received Norco in the ED that was effective.  Interventions   Assessment/Plan:  Norco x1      This document was prepared using Systems analyst and may include unintentional dictation errors.  Neomia Glass DNP, MHA, FNP-BC Nurse Practitioner [facility] Pager [phone]

## 2022-07-27 NOTE — Progress Notes (Signed)
  Brief Progress Note (See full H&P from earlier today)   Subjective: Pt reports headache is bothering him, still "feeling off," no chest pain or SOB. Denies new weakness or worsening unsteadiness.    Objective: Relevant new results:  BP trended up  Echo done, pending read LDL 101, TG 221 K 3.3 Physical Exam:  BP (!) 164/89   Pulse 82   Temp 97.8 F (36.6 C) (Oral)   Resp 18   SpO2 100%  Constitutional:  General Appearance: alert, well-developed, well-nourished, NAD Respiratory: Normal respiratory effort Breath sounds normal, no wheeze/rhonchi/rales Cardiovascular: S1/S2 normal, no murmur/rub/gallop auscultated No lower extremity edema Gastrointestinal: Nontender, no masses Musculoskeletal:  No clubbing/cyanosis of digits Neurological: No cranial nerve deficit on limited exam Psychiatric: Normal judgment/insight Normal mood and affect   Assessment/Plan changes or updates compared to H&P: CVA w/ concern for aneurysm on outpatient MRI but no aneurysm noted on CTA H/N - neuro to see today, echo pending, continue telemetry Hypertension - remote/subacute neuro event, no permissive HTN needed, will lower BP gradually Headache - neuro consult pending, may be d/t high BP, will treat BP and order pain control  Hx Afib on Eliquis at home - held d/t concern for aneurysm and high BP btu no aneurysm on CTA. Note Eliquis was not documented on med rec. Defer to neuro whether to restart this.  Hypokalemia - repleted and will get Mg levels as well GERD - d/c PPI d/t duplicate therapy

## 2022-07-27 NOTE — ED Notes (Signed)
Assigned pt care. Pt resting comfortably in hallway bed 12

## 2022-07-27 NOTE — Assessment & Plan Note (Signed)
Hold antihypertensives to allow for permissive hypertension 

## 2022-07-27 NOTE — Evaluation (Signed)
Physical Therapy Evaluation Patient Details Name: Kevin Esparza MRN: [id number] DOB: 1961-10-02 Today's Date: 07/27/2022  History of Present Illness  Pt is a 61 y.o. male presenting to hospital 9/25 with HA's (had MRI at [place] for HA evaluation and instructed to present to ED for further evaluation--MRI showing acute to subacute R cerebellar infarct.).  Pt with intermittent HA's about a month ago but developed balance issues past several days feeling like he was going to fall backwards whenever he stood up; also memory problems and was asking repetitive questions.  Pt admited with cerebellar stroke and a-fib.  PMH includes neuroendocrine carcinoma, htn, DM, former smoker, pericarditis, a-fib, CHF, anxiety, c-spine surgery.  Clinical Impression  Prior to hospital admission, pt was independent with functional mobility; occasional use of SPC when his R knee "acts up"; lives alone with his wife and son in 1 level home with steps to enter.  4/10 HA beginning, during, and end of session.  Decreased STM noted at times during session and pt noted with difficulty concentrating at times.  Pt reports feeling "dizzy" if he stands up quickly so pt would stand slowly during session except 1x when pt forgot (pt then stood until he felt better/safe and then would walk).  Pt mostly steady during session but did drift L mildly when walking and looking L and drifting mildly to L when looking R (pt able to maintain balance on own).  Attempted to perform DGI (and pt performed well except for mild impairment with gait with horizontal head turns as previously noted), but unable to finish d/t stairs not readily available in ER (see below for details).  Currently pt is modified independent with bed mobility; independent with transfers; and SBA ambulating 200 feet (no UE support).  Pt would benefit from skilled PT to address noted impairments and functional limitations (see below for any additional details).  Upon hospital  discharge, pt would benefit from OP PT for higher level balance.    Recommendations for follow up therapy are one component of a multi-disciplinary discharge planning process, led by the attending physician.  Recommendations may be updated based on patient status, additional functional criteria and insurance authorization.  Follow Up Recommendations Outpatient PT      Assistance Recommended at Discharge PRN  Patient can return home with the following  A little help with walking and/or transfers;A little help with bathing/dressing/bathroom;Assistance with cooking/housework;Direct supervision/assist for medications management;Direct supervision/assist for financial management;Assist for transportation;Help with stairs or ramp for entrance    Equipment Recommendations None recommended by PT  Recommendations for Other Services  OT consult    Functional Status Assessment Patient has had a recent decline in their functional status and demonstrates the ability to make significant improvements in function in a reasonable and predictable amount of time.     Precautions / Restrictions Precautions Precautions: Fall Restrictions Weight Bearing Restrictions: No      Mobility  Bed Mobility Overal bed mobility: Modified Independent             General bed mobility comments: HOB elevated; semi-supine to/from sitting    Transfers Overall transfer level: Independent Equipment used: None               General transfer comment: steady safe transfers; when pt stood quickly, pt would stop d/t feeling "dizzy" and then would ambulate once symptoms improved (pt tried to stand slowly during session but forgot 1x)    Ambulation/Gait Ambulation/Gait assistance: Supervision Gait Distance (Feet): 200 Feet Assistive device:  None Gait Pattern/deviations: Step-through pattern Gait velocity: normal     General Gait Details: steady step through gait pattern (except as noted with horizontal  head turns)  Financial trader Rankin (Stroke Patients Only)       Balance Overall balance assessment: Needs assistance Sitting-balance support: No upper extremity supported, Feet supported Sitting balance-Leahy Scale: Normal Sitting balance - Comments: steady sitting reaching outside BOS   Standing balance support: No upper extremity supported, During functional activity Standing balance-Leahy Scale: Good Standing balance comment: occasional drifting or altered stepping pattern with head turns R/L                 Standardized Balance Assessment Standardized Balance Assessment : Dynamic Gait Index   Dynamic Gait Index Level Surface: Normal Change in Gait Speed: Normal Gait with Horizontal Head Turns: Mild Impairment Gait with Vertical Head Turns: Normal Gait and Pivot Turn: Normal Step Over Obstacle: Normal Step Around Obstacles: Normal Steps:  (deferred d/t in emergency room and not easily accessible but anticipate score of 2-mild impairment)       Pertinent Vitals/Pain Pain Assessment Pain Assessment: 0-10 Pain Score: 4  Pain Location: Headache Pain Descriptors / Indicators: Headache, Constant Pain Intervention(s): Limited activity within patient's tolerance, Monitored during session, Premedicated before session, Repositioned Vitals (HR and O2 on room air) stable and WFL throughout treatment session.    Home Living Family/patient expects to be discharged to:: Private residence Living Arrangements: Spouse/significant other;Children (Pt's wife and son) Available Help at Discharge: Family Type of Home: Mobile home Home Access: Stairs to enter Entrance Stairs-Rails: Right;Left;Can reach both Entrance Stairs-Number of Steps: 3 from front or 16 from back   [place]: One level [place]: Grab bars - tub/shower;Cane - single point      Prior Function Prior Level of Function : Independent/Modified Independent              Mobility Comments: Occasional use of SPC when R knee "acts up".  No balance issues typically but last few days pt has had a couplde "stumbles" and drifiting (but able to self correct)       Hand Dominance        Extremity/Trunk Assessment   Upper Extremity Assessment Upper Extremity Assessment: Overall WFL for tasks assessed;Defer to OT evaluation    Lower Extremity Assessment Lower Extremity Assessment: RLE deficits/detail (5/5 B hip flexion, knee flexion/extension, and DF; intact B LE proprioception, tone, and heel to shin coordination) RLE Deficits / Details: decreased light touch R medial/lateral thigh and medial lower leg    Cervical / Trunk Assessment Cervical / Trunk Assessment: Normal  Communication   Communication: No difficulties  Cognition Arousal/Alertness: Awake/alert Behavior During Therapy: WFL for tasks assessed/performed Overall Cognitive Status: Impaired/Different from baseline                                 General Comments: A&O x4; decreased STM noted; pt intermittently looking away from therapist and reporting it helped him keep focus on what therapist was saying; pt occasionally asking therapist to repeat herself d/t impaired concentration        General Comments  Primary nurse busy with another pt but another ED nurse was heading into that pt's room and reported she would let nurse know PT was working with pt.  Pt agreeable to PT session.  Exercises     Assessment/Plan    PT Assessment Patient needs continued PT services  PT Problem List Decreased balance;Decreased cognition;Decreased knowledge of use of DME;Pain       PT Treatment Interventions DME instruction;Gait training;Stair training;Functional mobility training;Therapeutic activities;Therapeutic exercise;Balance training;Patient/family education    PT Goals (Current goals can be found in the Care Plan section)  Acute Rehab PT Goals Patient Stated Goal: to  improve balance PT Goal Formulation: With patient Time For Goal Achievement: 08/10/22 Potential to Achieve Goals: Good    Frequency 7X/week     Co-evaluation               AM-PAC PT "6 Clicks" Mobility  Outcome Measure Help needed turning from your back to your side while in a flat bed without using bedrails?: None Help needed moving from lying on your back to sitting on the side of a flat bed without using bedrails?: None Help needed moving to and from a bed to a chair (including a wheelchair)?: None Help needed standing up from a chair using your arms (e.g., wheelchair or bedside chair)?: None Help needed to walk in hospital room?: A Little Help needed climbing 3-5 steps with a railing? : A Little 6 Click Score: 22    End of Session   Activity Tolerance: Patient tolerated treatment well Patient left:  (in stretcher bed with all needs in reach) Nurse Communication: Other (comment) (pt requesting IV to be checked) PT Visit Diagnosis: Unsteadiness on feet (R26.81)    Time: 5329-9242 PT Time Calculation (min) (ACUTE ONLY): 26 min   Charges:   PT Evaluation $PT Eval Low Complexity: 1 Low PT Treatments $Therapeutic Activity: 8-22 mins       Leitha Bleak, PT 07/27/22, 9:51 AM

## 2022-07-27 NOTE — Assessment & Plan Note (Addendum)
Continue Eliquis.  We will hold metoprolol for tonight for permissive hypertension

## 2022-07-27 NOTE — H&P (Addendum)
History and Physical    Patient: Kevin Esparza GEX:[id number] DOB: Mar 10, 1961 DOA: 07/26/2022 DOS: the patient was seen and examined on 07/27/2022 PCP: Ranae Plumber, [place]  Patient coming from: Home  Chief Complaint:  Chief Complaint  Patient presents with   Headache    HPI: Kevin Esparza is a 61 y.o. male with medical history significant for Neuroendocrine carcinoma, HTN, DM, former smoker, hospitalized in May with pericarditis and found to be in A-fib, currently on Eliquis who presents to the ED for headaches.  Patient had an MRI at her [facility] for evaluation of his headache and was instructed to present to the ED for further evaluation.  Patient started having intermittent headaches about a month ago and had been taking hydrocodone.  Over the past several days however he developed balance issues, feeling like he was going to fall backwards whenever he stood up as well as memory problems and that he was asking repetitive questions.  He reports compliance with his apixaban with his last dose being the morning of arrival.  He has no slurred speech or one-sided weakness.  MRI from [facility] showed finding of an acute to subacute right cerebellar infarct. ED course and data review: BP 178/98 with otherwise normal vitals.  Labs significant for potassium of 3.3 otherwise unremarkable EKG, personally viewed and interpreted showing NSR at 63. Outside MRI showed: "Small acute or subacute right cerebellar infarct.   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 Recommend CTA or MRA for complete evaluation.   6m tiny enhancement is seen at the periphery of right inferior cerebellar infarction, which may be due to subacute infarction. However, metastasis cannot be excluded given the history of gastric neuroendocrine tumor. Recommend MRI with contrast follow-up in 6-8 weeks.   A 264menhancing nodule in the right parietal  parietal lobe cortex, which is indeterminate but also suspicious for metastasis."   CTA head and neck with and without contrast showing the following:  IMPRESSION: CT head:   1. Small infarct within the right cerebellar hemisphere, new from the prior head CT of 06/27/2022 and likely acute/subacute. 2. Paranasal sinus disease, as described.   CTA neck:   The common carotid, internal carotid and vertebral arteries are patent within the neck without stenosis. Mild atherosclerotic plaque about both carotid bifurcations.   CTA head:   1. No intracranial large vessel occlusion or proximal high-grade arterial stenosis identified. 2. Nonstenotic atherosclerotic plaque within the intracranial ICAs, bilaterally.  Patient given aspirin 324 mg and hospitalist consulted for admission.   Review of Systems: As mentioned in the history of present illness. All other systems reviewed and are negative.  Past Medical History:  Diagnosis Date   Anxiety    Arthritis    knees, hands   Cancer (HCC)    CHF (congestive heart failure) (HCC)    Diabetes mellitus without complication (H[place]   type 2   ED (erectile dysfunction)    Elevated PSA    GERD (gastroesophageal reflux disease)    Heartburn    History of BPH    HLD (hyperlipidemia)    HTN (hypertension)    Neuroendocrine carcinoma of stomach ([place]4/07/2021   Pneumonia    several times   Past Surgical History:  Procedure Laterality Date   APPENDECTOMY  2005   BIOPSY  03/19/2021   Procedure: BIOPSY;  Surgeon: MaIrving Copas MD;  [facility];  Service: Gastroenterology;;   BIOPSY  05/13/2022   Procedure: BIOPSY;  Surgeon: Irving Copas., MD;  Location: [place];  Service: Gastroenterology;;   [place]   c4-c7   CHOLECYSTECTOMY  2005   COLONOSCOPY WITH PROPOFOL N/A 12/31/2020   Procedure: COLONOSCOPY WITH PROPOFOL;  Surgeon: Virgel Manifold, MD;  Location: [facility] ENDOSCOPY;   Service: Endoscopy;  Laterality: N/A;   COLONOSCOPY WITH PROPOFOL N/A 03/19/2021   Procedure: COLONOSCOPY WITH PROPOFOL;  Surgeon: Rush Landmark Telford Nab., MD;  Location: [place];  Service: Gastroenterology;  Laterality: N/A;   COLONOSCOPY WITH PROPOFOL N/A 05/13/2022   Procedure: COLONOSCOPY WITH PROPOFOL;  Surgeon: Rush Landmark Telford Nab., MD;  Location: [place];  Service: Gastroenterology;  Laterality: N/A;   ENDOSCOPIC MUCOSAL RESECTION  03/19/2021   Procedure: ENDOSCOPIC MUCOSAL RESECTION;  Surgeon: Rush Landmark Telford Nab., MD;  Location: [place];  Service: Gastroenterology;;   ENDOSCOPIC MUCOSAL RESECTION N/A 05/13/2022   Procedure: ENDOSCOPIC MUCOSAL RESECTION;  Surgeon: Irving Copas., MD;  Location: [place];  Service: Gastroenterology;  Laterality: N/A;   ESOPHAGOGASTRODUODENOSCOPY (EGD) WITH PROPOFOL N/A 12/31/2020   Procedure: ESOPHAGOGASTRODUODENOSCOPY (EGD) WITH PROPOFOL;  Surgeon: Virgel Manifold, MD;  Location: [facility] ENDOSCOPY;  Service: Endoscopy;  Laterality: N/A;   ESOPHAGOGASTRODUODENOSCOPY (EGD) WITH PROPOFOL N/A 03/19/2021   Procedure: ESOPHAGOGASTRODUODENOSCOPY (EGD) WITH PROPOFOL;  Surgeon: Rush Landmark Telford Nab., MD;  Location: [place];  Service: Gastroenterology;  Laterality: N/A;   ESOPHAGOGASTRODUODENOSCOPY (EGD) WITH PROPOFOL N/A 05/13/2022   Procedure: ESOPHAGOGASTRODUODENOSCOPY (EGD) WITH PROPOFOL;  Surgeon: Rush Landmark Telford Nab., MD;  Location: [place];  Service: Gastroenterology;  Laterality: N/A;   HEMOSTASIS CLIP PLACEMENT  03/19/2021   Procedure: HEMOSTASIS CLIP PLACEMENT;  Surgeon: Irving Copas., MD;  Location: [place];  Service: Gastroenterology;;   HEMOSTASIS CLIP PLACEMENT  05/13/2022   Procedure: HEMOSTASIS CLIP PLACEMENT;  Surgeon: Irving Copas., MD;  Location: [place];  Service: Gastroenterology;;   MULTIPLE TOOTH EXTRACTIONS     dentures   POLYPECTOMY  03/19/2021   Procedure: POLYPECTOMY;   Surgeon: Irving Copas., MD;  Location: [place];  Service: Gastroenterology;;   POLYPECTOMY  05/13/2022   Procedure: POLYPECTOMY;  Surgeon: Irving Copas., MD;  Location: [place];  Service: Gastroenterology;;  EGD and colon   SUBMUCOSAL LIFTING INJECTION  03/19/2021   Procedure: SUBMUCOSAL LIFTING INJECTION;  Surgeon: Irving Copas., MD;  Location: [place];  Service: Gastroenterology;;   SUBMUCOSAL LIFTING INJECTION  05/13/2022   Procedure: SUBMUCOSAL LIFTING INJECTION;  Surgeon: Irving Copas., MD;  Location: [place];  Service: Gastroenterology;;   SUBMUCOSAL TATTOO INJECTION  05/13/2022   Procedure: SUBMUCOSAL TATTOO INJECTION;  Surgeon: Irving Copas., MD;  Location: [place];  Service: Gastroenterology;;   TONSILLECTOMY     UPPER ESOPHAGEAL ENDOSCOPIC ULTRASOUND (EUS) N/A 03/19/2021   Procedure: UPPER ESOPHAGEAL ENDOSCOPIC ULTRASOUND (EUS);  Surgeon: Irving Copas., MD;  Location: [place];  Service: Gastroenterology;  Laterality: N/A;   Social History:  reports that he quit smoking about 18 years ago. His smoking use included cigarettes. His smokeless tobacco use includes chew. He reports current alcohol use of about 1.0 standard drink of alcohol per week. He reports current drug use. Frequency: 1.00 time per week. Drug: Marijuana.  No Known Allergies  Family History  Problem Relation Age of Onset   Kidney cancer Maternal Grandmother    Liver cancer Maternal Grandmother    Stroke Father    Cancer Father        unknown origin   Hypertension Mother    Diabetes  Mother    Osteoarthritis Mother    Osteoporosis Mother    Lung cancer Maternal Grandfather    Kidney disease Neg Hx    Prostate cancer Neg Hx    Bladder Cancer Neg Hx    Colon cancer Neg Hx    Esophageal cancer Neg Hx    Inflammatory bowel disease Neg Hx    Pancreatic cancer Neg Hx    Rectal cancer Neg Hx    Stomach cancer Neg Hx     Prior  to Admission medications   Medication Sig Start Date End Date Taking? Authorizing Provider  acetaminophen (TYLENOL) 650 MG CR tablet Take 1,300 mg by mouth See admin instructions. Take 1300 mg in the morning, may take a second 1300 mg dose during the day as needed for pain   Yes [provider]  apixaban (ELIQUIS) 5 MG TABS tablet Take 1 tablet (5 mg total) by mouth 2 (two) times daily. 05/15/22  Yes Mansouraty, Telford Nab., MD  atorvastatin (LIPITOR) 20 MG tablet Take 20 mg by mouth daily.   Yes [provider]  enalapril (VASOTEC) 5 MG tablet Take 5 mg by mouth daily.   Yes [provider]  famotidine (PEPCID) 20 MG tablet Take 1 tablet (20 mg total) by mouth 2 (two) times daily. 06/27/22  Yes Carrie Mew, MD  gabapentin (NEURONTIN) 300 MG capsule Take 300-600 mg by mouth at bedtime.   Yes [provider]  Magnesium 250 MG TABS Take 250 mg by mouth 2 (two) times daily.   Yes [provider]  metFORMIN (GLUCOPHAGE) 1000 MG tablet Take 1,000 mg by mouth daily with breakfast.   Yes [provider]  metoprolol tartrate (LOPRESSOR) 50 MG tablet Take 50 mg by mouth 2 (two) times daily.   Yes [provider]  omeprazole (PRILOSEC) 20 MG capsule Take 1 capsule (20 mg total) by mouth 2 (two) times daily before a meal. Take 20 mg daily, may take a second 20 mg dose as needed for heartburn 05/13/22  Yes Mansouraty, Telford Nab., MD  sucralfate (CARAFATE) 1 g tablet Take 1 tablet (1 g total) by mouth 4 (four) times daily. 06/27/22  Yes Carrie Mew, MD  tamsulosin (FLOMAX) 0.4 MG CAPS capsule Take 1 capsule (0.4 mg total) by mouth daily. 03/16/22  Yes McGowan, Larene Beach A, PA-C  albuterol (VENTOLIN HFA) 108 (90 Base) MCG/ACT inhaler Inhale 2 puffs into the lungs every 6 (six) hours as needed for wheezing or shortness of breath.    [provider]  colchicine 0.6 MG tablet Take 0.6 mg by mouth 2 (two) times daily as needed (gout).     [provider]  cyclobenzaprine (FLEXERIL) 10 MG tablet Take 10 mg by mouth 3 (three) times daily as needed for muscle spasms.    [provider]  diclofenac Sodium (VOLTAREN) 1 % GEL Apply 2 g topically daily as needed (pain).    [provider]  HYDROcodone-acetaminophen (NORCO/VICODIN) 5-325 MG tablet Take 1 tablet by mouth every 4 (four) hours as needed. 07/16/22   [provider]  ketorolac (TORADOL) 10 MG tablet Take 1 tablet (10 mg total) by mouth every 6 (six) hours as needed for moderate pain. 06/27/22   Carrie Mew, MD  lidocaine (LIDODERM) 5 % Place 1 patch onto the skin daily as needed (pain/spasms). Remove & Discard patch within 12 hours or as directed by MD    [provider]  Magnesium Oxide 500 MG CAPS Take 1 capsule (500 mg  total) by mouth 2 (two) times daily. Patient not taking: Reported on 05/05/2022 04/05/22   Furth, Cadence H, PA-C  metoCLOPramide (REGLAN) 10 MG tablet Take 1 tablet (10 mg total) by mouth every 6 (six) hours as needed. 06/27/22   Carrie Mew, MD  traMADol (ULTRAM) 50 MG tablet Take 50 mg by mouth 4 (four) times daily as needed. 07/01/22   [provider]    Physical Exam: Vitals:   07/26/22 1641  BP: (!) 178/98  Pulse: 73  Resp: 17  Temp: 98.3 F (36.8 C)  TempSrc: Oral  SpO2: 100%   Physical Exam Vitals and nursing note reviewed.  Constitutional:      General: He is not in acute distress. HENT:     Head: Normocephalic and atraumatic.  Cardiovascular:     Rate and Rhythm: Normal rate and regular rhythm.     Heart sounds: Normal heart sounds.  Pulmonary:     Effort: Pulmonary effort is normal.     Breath sounds: Normal breath sounds.  Abdominal:     Palpations: Abdomen is soft.     Tenderness: There is no abdominal tenderness.  Neurological:     Mental Status: Mental status is at baseline.     Cranial Nerves: No cranial nerve deficit or dysarthria.     Coordination:  Finger-Nose-Finger Test normal.     Labs on Admission: I have personally reviewed following labs and imaging studies  CBC: Recent Labs  Lab 07/26/22 1655  WBC 7.7  NEUTROABS 4.2  HGB 14.7  HCT 43.4  MCV 80.7  PLT 751   Basic Metabolic Panel: Recent Labs  Lab 07/26/22 1655  NA 139  K 3.3*  CL 104  CO2 27  GLUCOSE 96  BUN <5*  CREATININE 1.06  CALCIUM 9.7   GFR: CrCl cannot be calculated (Unknown ideal weight.). Liver Function Tests: Recent Labs  Lab 07/26/22 1655  AST 25  ALT 21  ALKPHOS 74  BILITOT 1.0  PROT 7.8  ALBUMIN 4.1   No results for input(s): "LIPASE", "AMYLASE" in the last 168 hours. No results for input(s): "AMMONIA" in the last 168 hours. Coagulation Profile: Recent Labs  Lab 07/26/22 1655  INR 1.1   Cardiac Enzymes: No results for input(s): "CKTOTAL", "CKMB", "CKMBINDEX", "TROPONINI" in the last 168 hours. BNP (last 3 results) No results for input(s): "PROBNP" in the last 8760 hours. HbA1C: No results for input(s): "HGBA1C" in the last 72 hours. CBG: No results for input(s): "GLUCAP" in the last 168 hours. Lipid Profile: No results for input(s): "CHOL", "HDL", "LDLCALC", "TRIG", "CHOLHDL", "LDLDIRECT" in the last 72 hours. Thyroid Function Tests: No results for input(s): "TSH", "T4TOTAL", "FREET4", "T3FREE", "THYROIDAB" in the last 72 hours. Anemia Panel: No results for input(s): "VITAMINB12", "FOLATE", "FERRITIN", "TIBC", "IRON", "RETICCTPCT" in the last 72 hours. Urine analysis:    Component Value Date/Time   COLORURINE STRAW (A) 07/26/2022 1655   APPEARANCEUR CLEAR (A) 07/26/2022 1655   APPEARANCEUR Clear 02/10/2021 1108   LABSPEC 1.024 07/26/2022 1655   PHURINE 6.0 07/26/2022 1655   GLUCOSEU NEGATIVE 07/26/2022 1655   HGBUR NEGATIVE 07/26/2022 1655   BILIRUBINUR NEGATIVE 07/26/2022 1655   BILIRUBINUR Negative 02/10/2021 1108   [place] 07/26/2022 1655   PROTEINUR NEGATIVE 07/26/2022 1655   NITRITE NEGATIVE  07/26/2022 1655   LEUKOCYTESUR NEGATIVE 07/26/2022 1655    Radiological Exams on Admission: CT ANGIO HEAD NECK W WO CM  Result Date: 07/26/2022 CLINICAL DATA:  Provided history: Neuro deficit, acute, stroke suspected. EXAM:  CT ANGIOGRAPHY HEAD AND NECK TECHNIQUE: Multidetector CT imaging of the head and neck was performed using the standard protocol during bolus administration of intravenous contrast. Multiplanar CT image reconstructions and MIPs were obtained to evaluate the vascular anatomy. Carotid stenosis measurements (when applicable) are obtained utilizing NASCET criteria, using the distal internal carotid diameter as the denominator. RADIATION DOSE REDUCTION: This exam was performed according to the departmental dose-optimization program which includes automated exposure control, adjustment of the mA and/or kV according to patient size and/or use of iterative reconstruction technique. CONTRAST:  57m OMNIPAQUE IOHEXOL 350 MG/ML SOLN COMPARISON:  Head CT 06/27/2022. FINDINGS: CT HEAD FINDINGS Brain: No age advanced or lobar predominant parenchymal atrophy. Small infarct within the right cerebellar hemisphere, new from the prior head CT of 06/27/2022, likely acute or subacute. There is no acute intracranial hemorrhage. No demarcated cortical infarct. No extra-axial fluid collection. No evidence of an intracranial mass. No midline shift. Vascular: No hyperdense vessel.  Atherosclerotic calcifications. Skull: No fracture or aggressive osseous lesion. Sinuses/Orbits: No mass or acute finding within the imaged orbits. Mild mucosal thickening within the bilateral frontal sinuses. Mild mucosal thickening and fluid within the left ethmoid air cells. Review of the MIP images confirms the above findings CTA NECK FINDINGS Aortic arch: Common origin of the innominate and left common carotid arteries. Atherosclerotic plaque within the visualized aortic arch and proximal major branch vessels of the neck. Streak and  beam hardening artifact arising from a dense right-sided contrast bolus partially obscures the right subclavian artery. Within this limitation, there is no appreciable hemodynamically significant innominate or proximal subclavian artery stenosis. Right carotid system: CCA and ICA patent within the neck without stenosis. Minimal atherosclerotic plaque about the carotid bifurcation. Left carotid system: CCA and ICA patent within the neck without stenosis. Mild atherosclerotic plaque about the carotid bifurcation. Vertebral arteries: Patent within the neck without stenosis or significant atherosclerotic disease. The right left vertebral artery is dominant. Skeleton: Spondylosis. C4-C5 vertebral body ankylosis. No acute fracture or aggressive osseous lesion. Other neck: 4 mm nodule within the right thyroid lobe, not meeting consensus criteria for ultrasound follow-up based on size. No follow-up imaging is recommended. Reference: J Am Coll Radiol. 2015 Feb;12(2): 143-50. Upper chest: No consolidation within the imaged lung apices. Review of the MIP images confirms the above findings CTA HEAD FINDINGS Anterior circulation: The intracranial internal carotid arteries are patent. Nonstenotic atherosclerotic plaque within both vessels. The M1 middle cerebral arteries are patent. No M2 proximal branch occlusion or high-grade proximal stenosis. The anterior cerebral arteries are patent. No intracranial aneurysm is identified. Posterior circulation: Patent non dominant intracranial right vertebral artery, which terminates predominantly or entirely as the right PICA. The dominant intracranial left vertebral artery is patent without stenosis. The basilar artery is patent. The posterior cerebral arteries are patent. Posterior communicating arteries are diminutive or absent, bilaterally. Venous sinuses: Within the limitations of contrast timing, no convincing thrombus. Anatomic variants: As described. Review of the MIP images  confirms the above findings IMPRESSION: CT head: 1. Small infarct within the right cerebellar hemisphere, new from the prior head CT of 06/27/2022 and likely acute/subacute. 2. Paranasal sinus disease, as described. CTA neck: The common carotid, internal carotid and vertebral arteries are patent within the neck without stenosis. Mild atherosclerotic plaque about both carotid bifurcations. CTA head: 1. No intracranial large vessel occlusion or proximal high-grade arterial stenosis identified. 2. Nonstenotic atherosclerotic plaque within the intracranial ICAs, bilaterally. Electronically Signed   By: KKellie SimmeringD.O.  On: 07/26/2022 17:59     Data Reviewed: Relevant notes from primary care and specialist visits, past discharge summaries as available in EHR, including Care Everywhere. Prior diagnostic testing as pertinent to current admission diagnoses Updated medications and problem lists for reconciliation ED course, including vitals, labs, imaging, treatment and response to treatment Triage notes, nursing and pharmacy notes and ED provider's notes Notable results as noted in HPI   Assessment and Plan: * Cerebellar stroke, acute [facility]) Patient with several day onset of symptoms MRI from [facility] showed Small acute or subacute right cerebellar infarct.  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 Recommend CTA or MRA for complete evaluation.  70m tiny enhancement is seen at the periphery of right inferior cerebellar infarction, which may be due to subacute infarction. However, metastasis cannot be excluded given the history of gastric neuroendocrine tumor. Recommend MRI with contrast follow-up in 6-8 weeks.  A 258menhancing nodule in the right parietal parietal lobe cortex, which is indeterminate but also suspicious for metastasis.   Permissive hypertension for first 24-48 hrs post stroke onset: Prn Labetalol IV or Vasotec IV If  BP greater than 220/120  Telemetry, echocardiogram Avoid dextrose containing fluids, Maintain euglycemia, euthermia Continue Lipitor.  Patient received aspirin in the ED.  Will hold Eliquis tonight pending neurology recommendations Neuro checks q1 x4, q4 hrs x 24 hrs and then per shift Head of bed 30 degrees Physical therapy/Occupational therapy/Speech therapy if failed dysphagia screen Neurology consult to follow   Atrial fibrillation (HCBanderaContinue Eliquis.  We will hold metoprolol for tonight for permissive hypertension  Diabetes mellitus, type 2 (HCC) Sliding scale insulin coverage  HTN (hypertension) Hold antihypertensives to allow for permissive hypertension  Neuroendocrine carcinoma of stomach (HCWinneshiekNo acute issues suspected        DVT prophylaxis: eliquis  Consults: neurology  Advance Care Planning:   Code Status: Prior   Family Communication: none  Disposition Plan: Back to previous home environment  Severity of Illness: The appropriate patient status for this patient is INPATIENT. Inpatient status is judged to be reasonable and necessary in order to provide the required intensity of service to ensure the patient's safety. The patient's presenting symptoms, physical exam findings, and initial radiographic and laboratory data in the context of their chronic comorbidities is felt to place them at high risk for further clinical deterioration. Furthermore, it is not anticipated that the patient will be medically stable for discharge from the hospital within 2 midnights of admission.   * I certify that at the point of admission it is my clinical judgment that the patient will require inpatient hospital care spanning beyond 2 midnights from the point of admission due to high intensity of service, high risk for further deterioration and high frequency of surveillance required.*  Author: HaAthena MasseMD 07/27/2022 12:25 AM  For on call review [email or web address]

## 2022-07-27 NOTE — Assessment & Plan Note (Signed)
No acute issues suspected 

## 2022-07-27 NOTE — ED Notes (Signed)
Notified MD Mansy that pt passed swallow screed.  Diet order requested.

## 2022-07-27 NOTE — Assessment & Plan Note (Addendum)
Patient with several day onset of symptoms MRI from [facility] showed Small acute or subacute right cerebellar infarct.  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 Recommend CTA or MRA for complete evaluation.  60m tiny enhancement is seen at the periphery of right inferior cerebellar infarction, which may be due to subacute infarction. However, metastasis cannot be excluded given the history of gastric neuroendocrine tumor. Recommend MRI with contrast follow-up in 6-8 weeks.  A 276menhancingnodule in the right parietal parietal lobe cortex, which is indeterminate but also suspicious for metastasis.   Permissive hypertension for first 24-48 hrs post stroke onset: Prn Labetalol IV or Vasotec IV If BP greater than 220/120  Telemetry, echocardiogram Avoid dextrose containing fluids, Maintain euglycemia, euthermia Continue Lipitor.  Patient received aspirin in the ED.  Will hold Eliquis tonight pending neurology recommendations Neuro checks q1 x4, q4 hrs x 24 hrs and then per shift Head of bed 30 degrees Physical therapy/Occupational therapy/Speech therapy if failed dysphagia screen Neurology consult to follow

## 2022-07-27 NOTE — Evaluation (Signed)
Occupational Therapy Evaluation Patient Details Name: Kevin Esparza MRN: [id number] DOB: October 05, 1961 Today's Date: 07/27/2022   History of Present Illness Pt is a 61 y.o. 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  Pt with intermittent HA's about a month ago but developed balance issues past several days feeling like he was going to fall backwards whenever he stood up; also memory problems and was asking repetitive questions.  Pt admited with cerebellar stroke and a-fib.  PMH includes neuroendocrine carcinoma, htn, DM, former smoker, pericarditis, a-fib, CHF, anxiety, c-spine surgery.   Clinical Impression   Patient presenting with decreased independence in safety, functional mobility, balance, and cognition. Patient reports he lives with his wife and son in a mobile home. He reports he has a walk-in shower, shower chair, regular toilet, and SPC. Patient currently functioning independent with bed mobility, and transfers sit <> stand. Patient was able to ambulate ~81f while completing dual task activity, no LOB observed or dizziness reported. Patient was also given 3 words to remember ( pause, hat, computer) and was able to recall 1 word independently, requiring cues for other 2 words. Patient was also educated on use of memory aid to use at home, patient was also to verbalize understanding. No OT follow up recommended at this time. OT to complete order.      Recommendations for follow up therapy are one component of a multi-disciplinary discharge planning process, led by the attending physician.  Recommendations may be updated based on patient status, additional functional criteria and insurance authorization.   Follow Up Recommendations  No OT follow up    Assistance Recommended at Discharge Intermittent Supervision/Assistance  Patient can  return home with the following Direct supervision/assist for financial management;Assistance with cooking/housework;Assist for transportation    Functional Status Assessment  Patient has had a recent decline in their functional status and demonstrates the ability to make significant improvements in function in a reasonable and predictable amount of time.  Equipment Recommendations  None recommended by OT    Recommendations for Other Services       Precautions / Restrictions        Mobility Bed Mobility Overal bed mobility: Independent                  Transfers Overall transfer level: Independent Equipment used: None                      Balance Overall balance assessment: Independent   Sitting balance-Leahy Scale: Normal     Standing balance support: No upper extremity supported, During functional activity Standing balance-Leahy Scale: Good                             ADL either performed or assessed with clinical judgement   ADL Overall ADL's : Independent                                     Functional mobility during ADLs: Independent       Vision Baseline Vision/History: 1 Wears glasses              Pertinent Vitals/Pain Pain Assessment Pain Assessment: 0-10 Pain Score: 8  Pain Location: Headache Pain Descriptors / Indicators: Headache, Constant Pain  Intervention(s): RN gave pain meds during session, Repositioned, Monitored during session     Hand Dominance     Extremity/Trunk Assessment Upper Extremity Assessment Upper Extremity Assessment: Overall WFL for tasks assessed   Lower Extremity Assessment Lower Extremity Assessment: RLE deficits/detail;Generalized weakness       Communication Communication Communication: No difficulties   Cognition Arousal/Alertness: Awake/alert Behavior During Therapy: WFL for tasks assessed/performed Overall Cognitive Status: Impaired/Different from baseline Area  of Impairment: Memory                   Current Attention Level: Focused Memory: Decreased short-term memory                          Home Living Family/patient expects to be discharged to:: Private residence Living Arrangements: Spouse/significant other;Children Available Help at Discharge: Family Type of Home: Mobile home Home Access: Stairs to enter Entrance Stairs-Number of Steps: 3 from front or 16 from back Entrance Stairs-Rails: Right;Left;Can reach both [place]: One level     Bathroom Shower/Tub: Occupational psychologist: [place];Cane - single point          Prior Functioning/Environment Prior Level of Function : Independent/Modified Independent                        OT Problem List:        OT Treatment/Interventions:      OT Goals(Current goals can be found in the care plan section) Acute Rehab OT Goals Patient Stated Goal: to return home. OT Goal Formulation: With patient Time For Goal Achievement: 07/27/22 Potential to Achieve Goals: Good  OT Frequency:         AM-PAC OT "6 Clicks" Daily Activity     Outcome Measure Help from another person eating meals?: None Help from another person taking care of personal grooming?: None Help from another person toileting, which includes using toliet, bedpan, or urinal?: None Help from another person bathing (including washing, rinsing, drying)?: None Help from another person to put on and taking off regular upper body clothing?: None Help from another person to put on and taking off regular lower body clothing?: A Little 6 Click Score: 23   End of Session Nurse Communication: Mobility status  Activity Tolerance: Patient tolerated treatment well Patient left: in bed                   Time: 1200-1216 OT Time Calculation (min): 16 min Charges:  OT General Charges $OT Visit: 1 Visit OT Evaluation $OT Eval Low Complexity: 1  Low OT Treatments $Therapeutic Activity: 8-22 mins    Christen Bedoya, OTS 07/27/2022, 2:36 PM

## 2022-07-27 NOTE — Assessment & Plan Note (Signed)
Sliding scale insulin coverage 

## 2022-07-27 NOTE — ED Notes (Signed)
Pt notified writer his head felt "off". Writer repeated mNIHS and noted it to be unchanged from prior assessment. Vital signs taken and BP noted to be elevated. DO Alexander notified. IV hydralazine ordered.

## 2022-07-28 DIAGNOSIS — I639 Cerebral infarction, unspecified: Secondary | ICD-10-CM | POA: Diagnosis not present

## 2022-07-28 DIAGNOSIS — I480 Paroxysmal atrial fibrillation: Secondary | ICD-10-CM

## 2022-07-28 DIAGNOSIS — C7A8 Other malignant neuroendocrine tumors: Secondary | ICD-10-CM | POA: Diagnosis not present

## 2022-07-28 DIAGNOSIS — R9389 Abnormal findings on diagnostic imaging of other specified body structures: Secondary | ICD-10-CM | POA: Diagnosis not present

## 2022-07-28 DIAGNOSIS — Z7901 Long term (current) use of anticoagulants: Secondary | ICD-10-CM

## 2022-07-28 LAB — BASIC METABOLIC PANEL
Anion gap: 7 (ref 5–15)
BUN: 6 mg/dL — ABNORMAL LOW (ref 8–23)
CO2: 25 mmol/L (ref 22–32)
Calcium: 9.2 mg/dL (ref 8.9–10.3)
Chloride: 106 mmol/L (ref 98–111)
Creatinine, Ser: 0.94 mg/dL (ref 0.61–1.24)
GFR, Estimated: >60 mL/min (ref >60)
Glucose, Bld: 111 mg/dL — ABNORMAL HIGH (ref 70–99)
Potassium: 3.9 mmol/L (ref 3.5–5.1)
Sodium: 138 mmol/L (ref 135–145)

## 2022-07-28 LAB — GLUCOSE, CAPILLARY
Glucose-Capillary: 103 mg/dL — ABNORMAL HIGH (ref 70–99)
Glucose-Capillary: 125 mg/dL — ABNORMAL HIGH (ref 70–99)
Glucose-Capillary: 117 mg/dL — ABNORMAL HIGH (ref 70–99)

## 2022-07-28 LAB — MAGNESIUM: Magnesium: 1.9 mg/dL (ref 1.7–2.4)

## 2022-07-28 MED ORDER — GABAPENTIN 300 MG PO CAPS: ORAL_CAPSULE | ORAL | 0 refills | Status: DC

## 2022-07-28 MED ORDER — ENALAPRIL MALEATE 10 MG PO TABS: 10.0000 mg | ORAL_TABLET | Freq: Every day | ORAL | 0 refills | Status: AC

## 2022-07-28 MED ORDER — ATORVASTATIN CALCIUM 40 MG PO TABS: 40.0000 mg | ORAL_TABLET | Freq: Every day | ORAL | 0 refills | Status: DC

## 2022-07-28 MED ORDER — APIXABAN 5 MG PO TABS: 5.0000 mg | ORAL_TABLET | Freq: Two times a day (BID) | ORAL | Status: DC

## 2022-07-28 MED ORDER — AMLODIPINE BESYLATE 5 MG PO TABS: 5.0000 mg | ORAL_TABLET | Freq: Every day | ORAL | 0 refills | Status: AC

## 2022-07-28 MED ORDER — AMLODIPINE BESYLATE 5 MG PO TABS
5.0000 mg | ORAL_TABLET | Freq: Every day | ORAL | Status: DC
  Administered 2022-07-28: 5 mg via ORAL
  Filled 2022-07-28: qty 1

## 2022-07-28 NOTE — Progress Notes (Signed)
Physical Therapy Treatment Patient Details Name: Kevin Esparza MRN: [id number] DOB: 01-Jul-1961 Today's Date: 07/28/2022   History of Present Illness Pt is a 61 y.o. 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  Pt with intermittent HA's about a month ago but developed balance issues past several days feeling like he was going to fall backwards whenever he stood up; also memory problems and was asking repetitive questions.  Pt admited with cerebellar stroke and a-fib.  PMH includes neuroendocrine carcinoma, htn, DM, former smoker, pericarditis, a-fib, CHF, anxiety, c-spine surgery.    PT Comments    Pt was sitting EOB upon arriving. Has been I'ly ambulating in room and hallway. He is cooperative throughout session and able to consistently follow commands without difficulty. Endorses having a headache but was able to tolerate session without increase in pain/headache. Overall pt is doing well from a PT standpoint. Did have some short term memory concerns but from a mobility standpoint progressing well. He was able to safely ambulate in hallway and up 2 flights of stairs. Had pt perform some dynamic gait exercises while ambulating without concerns of falling. Pt would benefit from OPPT however currently not willing. Acute PT will continue to follow per current POC progressing as able.    Recommendations for follow up therapy are one component of a multi-disciplinary discharge planning process, led by the attending physician.  Recommendations may be updated based on patient status, additional functional criteria and insurance authorization.  Follow Up Recommendations  No PT follow up (Pt is refusing all post acute PT.)     Assistance Recommended at Discharge PRN  Patient can return home with the following Assist for transportation   Equipment  Recommendations  None recommended by PT       Precautions / Restrictions Precautions Precautions: Fall Restrictions Weight Bearing Restrictions: No     Mobility  Bed Mobility Overal bed mobility: Independent   Transfers Overall transfer level: Independent Equipment used: None   Ambulation/Gait Ambulation/Gait assistance: Supervision Gait Distance (Feet): 400 Feet Assistive device: None Gait Pattern/deviations: Step-through pattern Gait velocity: normal    General Gait Details: Pt was able to ambulate without LOB >400 ft. He was able to ascend/descend 2 flights of stairs (12 stair)   Stairs Stairs: Yes Stairs assistance: Supervision Stair Management: No rails, Alternating pattern, One rail Left Number of Stairs: 24 General stair comments: pt was able to ascend/descend 2 flights of stairs without physical assistance. performed 1 flight without UE support going up but used rail with coming down. Pt has 16 steps to enter home.    Balance Overall balance assessment: Modified Independent Sitting-balance support: No upper extremity supported, Feet supported Sitting balance-Leahy Scale: Normal     Standing balance support: No upper extremity supported, During functional activity Standing balance-Leahy Scale: Good Standing balance comment: occasional drifting or altered stepping pattern with head turns R/L    Dynamic Gait Index Level Surface: Normal Change in Gait Speed: Normal Gait with Horizontal Head Turns: Mild Impairment Gait with Vertical Head Turns: Mild Impairment Gait and Pivot Turn: Normal Step Over Obstacle: Normal Step Around Obstacles: Normal Steps: Mild Impairment Total Score: 21      Cognition Arousal/Alertness: Awake/alert Behavior During Therapy: WFL for tasks assessed/performed Overall Cognitive Status: Impaired/Different from baseline Area of Impairment: Memory      Current Attention Level: Focused Memory: Decreased recall of precautions,  Decreased short-term memory      General Comments: Pt is A and O x 4 but does demonstrate STM deficits               Pertinent Vitals/Pain Pain Assessment Pain Assessment: 0-10 Pain Score: 1  Pain Location: Headache Pain Descriptors / Indicators: Headache, Constant Pain Intervention(s): Limited activity within patient's tolerance     PT Goals (current goals can now be found in the care plan section) Acute Rehab PT Goals Patient Stated Goal: to improve balance Progress towards PT goals: Progressing toward goals    Frequency    7X/week      PT Plan Discharge plan needs to be updated       AM-PAC PT "6 Clicks" Mobility   Outcome Measure  Help needed turning from your back to your side while in a flat bed without using bedrails?: None Help needed moving from lying on your back to sitting on the side of a flat bed without using bedrails?: None Help needed moving to and from a bed to a chair (including a wheelchair)?: None Help needed standing up from a chair using your arms (e.g., wheelchair or bedside chair)?: None Help needed to walk in hospital room?: A Little Help needed climbing 3-5 steps with a railing? : A Little 6 Click Score: 22    End of Session   Activity Tolerance: Patient tolerated treatment well Patient left: Other (comment);in bed;with call bell/phone within reach (Pt has been cleared by RN staff to I'ly ambulate) Nurse Communication: Mobility status PT Visit Diagnosis: Unsteadiness on feet (R26.81)     Time: 1001-1015 PT Time Calculation (min) (ACUTE ONLY): 14 min  Charges:  $Gait Training: 8-22 mins                    Julaine Fusi PTA 07/28/22, 12:03 PM

## 2022-07-28 NOTE — Discharge Summary (Signed)
Physician Discharge Summary   Patient: Neiman Roots MRN: [id number]  DOB: 01-25-1961   Admit:     Date of Admission: 07/26/2022 Admitted from: home   Discharge: Date of discharge: 07/28/22 Disposition: Home Condition at discharge: good  CODE STATUS: FULL CODE     Discharge Physician: Emeterio Reeve, DO [facility]     PCP: Ranae Plumber, [place]  Recommendations for Outpatient Follow-up:  Follow up with PCP Ranae Plumber, PA in 1-2 weeks to address stroke/headache follow-up, blood pressure control Please obtain labs/tests: BMP, CBC in 1-2 weeks  Please follow up on the following pending results: none Please consider outpatient neurology follow-up PCP AND OTHER OUTPATIENT PROVIDERS: SEE BELOW FOR SPECIFIC DISCHARGE INSTRUCTIONS PRINTED FOR PATIENT IN ADDITION TO GENERIC AVS PATIENT INFO    Discharge Instructions     Diet - low sodium heart healthy   Complete by: As directed    Discharge instructions   Complete by: As directed    Please see complete printed list, there have been some changes to your medications.  Atorvastatin dose is higher, enalapril dose is higher.  Have also added amlodipine to help with blood pressure.  Have changed gabapentin dosage to help with headache.  Please avoid taking opiates or over-the-counter pain medication for headache as overuse of these medicines can actually cause headaches to occur.   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)       Hospital Course: Patient initially came to the emergency department with concern for abnormal MRI done as outpatient, concerning for new cerebellar stroke and possible aneurysm.  CTA head/neck no aneurysm noted.  Echocardiogram essentially  normal.  Neurology reviewed outpatient MRI images and patient okay for discharge home.  Optimize prevention: Patient is already taking Eliquis for history of atrial fibrillation, increased atorvastatin to high potency, defer antiplatelets.  Changed medications to better control blood pressure, increased gabapentin to address headaches.   Assessment/plan:  Cerebellar stroke, acute ([place]) Headache Presented to ED with concern for abnormal outpatient MRI, Eliquis was initially held until neurology was able to review images.  Optimized secondary prevention: Continue Eliquis, increased atorvastatin, deferring antiplatelet therapy.  Crease gabapentin to help address headache, follow outpatient with PCP/neurology for this  HTN (hypertension) Adjusted medications to better control blood pressure: Increased home enalapril, added amlodipine  Atrial fibrillation (H[facility]) Continue Eliquis  Neuroendocrine carcinoma of stomach (HCC) Not clinically significant to this admission Follow outpatient  Diabetes mellitus, type 2 ([place]) Resume home metformin on discharge      Discharge Instructions  Allergies as of 07/28/2022   No Known Allergies      Medication List     STOP taking these medications    HYDROcodone-acetaminophen 5-325 MG tablet Commonly known as: NORCO/VICODIN   Magnesium Oxide -Mg Supplement 500 MG Caps   omeprazole 20 MG capsule Commonly known as: PRILOSEC       TAKE these medications    acetaminophen 650 MG CR tablet Commonly known as: TYLENOL Take 1,300 mg by mouth See admin instructions. Take 1300 mg in the morning, may take a second 1300 mg dose during the day as needed for pain   albuterol 108 (90 Base) MCG/ACT inhaler Commonly known as: VENTOLIN HFA Inhale 2 puffs  into the lungs every 6 (six) hours as needed for wheezing or shortness of breath.   amLODipine 5 MG tablet Commonly known as: NORVASC Take 1 tablet (5 mg total) by mouth daily. Start taking on:  July 29, 2022   apixaban 5 MG Tabs tablet Commonly known as: Eliquis Take 1 tablet (5 mg total) by mouth 2 (two) times daily.   atorvastatin 40 MG tablet Commonly known as: LIPITOR Take 1 tablet (40 mg total) by mouth daily. Start taking on: July 29, 2022 What changed:  medication strength how much to take   colchicine 0.6 MG tablet Take 0.6 mg by mouth 2 (two) times daily as needed (gout).   cyclobenzaprine 10 MG tablet Commonly known as: FLEXERIL Take 10 mg by mouth 3 (three) times daily as needed for muscle spasms.   diclofenac Sodium 1 % Gel Commonly known as: VOLTAREN Apply 2 g topically daily as needed (pain).   enalapril 10 MG tablet Commonly known as: VASOTEC Take 1 tablet (10 mg total) by mouth daily. Start taking on: July 29, 2022 What changed:  medication strength how much to take   famotidine 20 MG tablet Commonly known as: PEPCID Take 1 tablet (20 mg total) by mouth 2 (two) times daily.   gabapentin 300 MG capsule Commonly known as: NEURONTIN Take 300 mg p.o. every morning, take 600 mg p.o. nightly What changed:  how much to take how to take this when to take this additional instructions   ketorolac 10 MG tablet Commonly known as: TORADOL Take 1 tablet (10 mg total) by mouth every 6 (six) hours as needed for moderate pain.   Lidoderm 5 % Generic drug: lidocaine Place 1 patch onto the skin daily as needed (pain/spasms). Remove & Discard patch within 12 hours or as directed by MD   Magnesium 250 MG Tabs Take 250 mg by mouth 2 (two) times daily.   metFORMIN 1000 MG tablet Commonly known as: GLUCOPHAGE Take 1,000 mg by mouth daily with breakfast.   metoCLOPramide 10 MG tablet Commonly known as: REGLAN Take 1 tablet (10 mg total) by mouth every 6 (six) hours as needed.   metoprolol tartrate 50 MG tablet Commonly known as: LOPRESSOR Take 50 mg by mouth 2 (two) times daily.   sucralfate 1 g tablet Commonly known as:  Carafate Take 1 tablet (1 g total) by mouth 4 (four) times daily.   tamsulosin 0.4 MG Caps capsule Commonly known as: FLOMAX Take 1 capsule (0.4 mg total) by mouth daily.   traMADol 50 MG tablet Commonly known as: ULTRAM Take 50 mg by mouth 4 (four) times daily as needed.          No Known Allergies   Subjective: Patient reports headache is still present but is a bit better.  Overall, he is feeling significantly improved and is asking about discharge home.  Denies chest pain/shortness of breath, he is tolerating p.o. intake, no vision changes or nausea.   Discharge Exam: BP (!) 158/84 (BP Location: Right Arm)   Pulse 64   Temp 97.6 F (36.4 C) (Oral)   Resp 18   Ht '5\' 9"'$  (1.753 m)   Wt 75.9 kg   SpO2 98%   BMI 24.71 kg/m  General: Pt is alert, awake, not in acute distress Cardiovascular: RRR, S1/S2 +, no rubs, no gallops Respiratory: CTA bilaterally, no wheezing, no rhonchi Abdominal: Soft, NT, ND, bowel sounds + Extremities: no edema, no cyanosis     The results of significant diagnostics  from this hospitalization (including imaging, microbiology, ancillary and laboratory) are listed below for reference.     Microbiology: No results found for this or any previous visit (from the past 240 hour(s)).   Labs: BNP (last 3 results) No results for input(s): "BNP" in the last 8760 hours. Basic Metabolic Panel: Recent Labs  Lab 07/26/22 1655 07/28/22 0630  NA 139 138  K 3.3* 3.9  CL 104 106  CO2 27 25  GLUCOSE 96 111*  BUN <5* 6*  CREATININE 1.06 0.94  CALCIUM 9.7 9.2  MG  --  1.9   Liver Function Tests: Recent Labs  Lab 07/26/22 1655  AST 25  ALT 21  ALKPHOS 74  BILITOT 1.0  PROT 7.8  ALBUMIN 4.1   No results for input(s): "LIPASE", "AMYLASE" in the last 168 hours. No results for input(s): "AMMONIA" in the last 168 hours. CBC: Recent Labs  Lab 07/26/22 1655  WBC 7.7  NEUTROABS 4.2  HGB 14.7  HCT 43.4  MCV 80.7  PLT 173   Cardiac  Enzymes: No results for input(s): "CKTOTAL", "CKMB", "CKMBINDEX", "TROPONINI" in the last 168 hours. BNP: Invalid input(s): "POCBNP" CBG: Recent Labs  Lab 07/27/22 1131 07/27/22 1623 07/27/22 2049 07/28/22 0813 07/28/22 1142  GLUCAP 111* 102* 114* 103* 125*   D-Dimer No results for input(s): "DDIMER" in the last 72 hours. Hgb A1c No results for input(s): "HGBA1C" in the last 72 hours. Lipid Profile Recent Labs    07/27/22 0506  CHOL 171  HDL 26*  LDLCALC 101*  TRIG 221*  CHOLHDL 6.6   Thyroid function studies No results for input(s): "TSH", "T4TOTAL", "T3FREE", "THYROIDAB" in the last 72 hours.  Invalid input(s): "FREET3" Anemia work up No results for input(s): "VITAMINB12", "FOLATE", "FERRITIN", "TIBC", "IRON", "RETICCTPCT" in the last 72 hours. Urinalysis    Component Value Date/Time   COLORURINE STRAW (A) 07/26/2022 1655   APPEARANCEUR CLEAR (A) 07/26/2022 1655   APPEARANCEUR Clear 02/10/2021 1108   LABSPEC 1.024 07/26/2022 1655   PHURINE 6.0 07/26/2022 1655   GLUCOSEU NEGATIVE 07/26/2022 1655   HGBUR NEGATIVE 07/26/2022 1655   BILIRUBINUR NEGATIVE 07/26/2022 1655   BILIRUBINUR Negative 02/10/2021 1108   KETONESUR NEGATIVE 07/26/2022 1655   PROTEINUR NEGATIVE 07/26/2022 1655   NITRITE NEGATIVE 07/26/2022 1655   LEUKOCYTESUR NEGATIVE 07/26/2022 1655   Sepsis Labs Recent Labs  Lab 07/26/22 1655  WBC 7.7   Microbiology No results found for this or any previous visit (from the past 240 hour(s)). Imaging ECHOCARDIOGRAM COMPLETE  Result Date: 07/27/2022    ECHOCARDIOGRAM REPORT   Patient Name:   GREG CRATTY Date of Exam: 07/27/2022 Medical Rec #:  [id number]        Height:       70.0 in Accession #:    [id number]       Weight:       178.0 lb Date of Birth:  1961-04-25         BSA:          1.986 m Patient Age:    26 years         BP:           158/85 mmHg Patient Gender: M                HR:           67 bpm. Exam Location:  [facility] Procedure: 2D Echo,  Cardiac Doppler and Color Doppler Indications:     I63.9 Stroke  History:  Patient has prior history of Echocardiogram examinations, most                  recent 06/03/2022. CHF, CAD, History of stomach cancer,                  Arrythmias:Atrial Fibrillation; Risk Factors:Diabetes,                  Hypertension, Dyslipidemia and Former Smoker.  Sonographer:     Rosalia Hammers Referring Phys:  [id number] Athena Masse Diagnosing Phys: Nelva Bush MD IMPRESSIONS  1. Left ventricular ejection fraction, by estimation, is 60 to 65%. The left ventricle has normal function. The left ventricle has no regional wall motion abnormalities. Left ventricular diastolic parameters were normal.  2. Right ventricular systolic function is normal. The right ventricular size is normal. Tricuspid regurgitation signal is inadequate for assessing PA pressure.  3. The mitral valve is normal in structure. Mild mitral valve regurgitation. No evidence of mitral stenosis.  4. Tricuspid valve regurgitation is mild to moderate.  5. The aortic valve is tricuspid. There is mild thickening of the aortic valve. Aortic valve regurgitation is mild. Aortic valve sclerosis is present, with no evidence of aortic valve stenosis.  6. The inferior vena cava is normal in size with greater than 50% respiratory variability, suggesting right atrial pressure of 3 mmHg. FINDINGS  Left Ventricle: Left ventricular ejection fraction, by estimation, is 60 to 65%. The left ventricle has normal function. The left ventricle has no regional wall motion abnormalities. The left ventricular internal cavity size was normal in size. There is  borderline left ventricular hypertrophy. Left ventricular diastolic parameters were normal. Right Ventricle: The right ventricular size is normal. No increase in right ventricular wall thickness. Right ventricular systolic function is normal. Tricuspid regurgitation signal is inadequate for assessing PA pressure. Left Atrium: Left  atrial size was normal in size. Right Atrium: Right atrial size was normal in size. Pericardium: There is no evidence of pericardial effusion. Mitral Valve: The mitral valve is normal in structure. Mild mitral valve regurgitation. No evidence of mitral valve stenosis. Tricuspid Valve: The tricuspid valve is normal in structure. Tricuspid valve regurgitation is mild to moderate. Aortic Valve: The aortic valve is tricuspid. There is mild thickening of the aortic valve. Aortic valve regurgitation is mild. Aortic regurgitation PHT measures 660 msec. Aortic valve sclerosis is present, with no evidence of aortic valve stenosis. Aortic valve mean gradient measures 3.0 mmHg. Aortic valve peak gradient measures 4.7 mmHg. Aortic valve area, by VTI measures 2.34 cm. Pulmonic Valve: The pulmonic valve was normal in structure. Pulmonic valve regurgitation is mild. No evidence of pulmonic stenosis. Aorta: The aortic root and ascending aorta are structurally normal, with no evidence of dilitation. Pulmonary Artery: The pulmonary artery is of normal size. Venous: The inferior vena cava is normal in size with greater than 50% respiratory variability, suggesting right atrial pressure of 3 mmHg. IAS/Shunts: No atrial level shunt detected by color flow Doppler.  LEFT VENTRICLE PLAX 2D LVIDd:         4.70 cm   Diastology LVIDs:         2.90 cm   LV e' medial:    9.03 cm/s LV PW:         1.00 cm   LV E/e' medial:  8.9 LV IVS:        1.10 cm   LV e' lateral:   10.40 cm/s LVOT diam:     2.00  cm   LV E/e' lateral: 7.8 LV SV:         56 LV SV Index:   28 LVOT Area:     3.14 cm  RIGHT VENTRICLE RV Basal diam:  2.70 cm RV Mid diam:    2.20 cm RV S prime:     12.10 cm/s TAPSE (M-mode): 2.2 cm LEFT ATRIUM             Index        RIGHT ATRIUM           Index LA diam:        3.00 cm 1.51 cm/m   RA Area:     15.60 cm LA Vol (A2C):   36.6 ml 18.42 ml/m  RA Volume:   37.20 ml  18.73 ml/m LA Vol (A4C):   28.6 ml 14.40 ml/m LA Biplane Vol: 35.1  ml 17.67 ml/m  AORTIC VALVE                    PULMONIC VALVE AV Area (Vmax):    2.35 cm     PR End Diast Vel: 2.91 msec AV Area (Vmean):   2.34 cm AV Area (VTI):     2.34 cm AV Vmax:           108.00 cm/s AV Vmean:          73.300 cm/s AV VTI:            0.238 m AV Peak Grad:      4.7 mmHg AV Mean Grad:      3.0 mmHg LVOT Vmax:         80.70 cm/s LVOT Vmean:        54.500 cm/s LVOT VTI:          0.177 m LVOT/AV VTI ratio: 0.74 AI PHT:            660 msec  AORTA Ao Root diam: 3.40 cm MITRAL VALVE MV Area (PHT): 4.06 cm    SHUNTS MV Decel Time: 187 msec    Systemic VTI:  0.18 m MV E velocity: 80.70 cm/s  Systemic Diam: 2.00 cm MV A velocity: 84.40 cm/s MV E/A ratio:  0.96 Harrell Gave End MD Electronically signed by Nelva Bush MD Signature Date/Time: 07/27/2022/4:11:36 PM    Final       Time coordinating discharge: over 30 minutes  SIGNED:  Emeterio Reeve DO [facility]

## 2022-07-28 NOTE — TOC CM/SW Note (Signed)
Patient has orders to discharge home today. Chart reviewed. No TOC needs identified. CSW signing off.  Katilynn Sinkler, CSW [phone]  

## 2022-07-30 ENCOUNTER — Encounter: Payer: Self-pay | Admitting: Neurology

## 2022-07-30 NOTE — Consult Note (Signed)
NEUROLOGY CONSULTATION NOTE   Date of service: July 30, 2022 Patient Name: Kevin Esparza MRN:  [id number] DOB:  [birth year] Reason for consult: acute ischemic stroke Requesting physician: Dr. Emeterio Reeve _ _ _   _ __   _ __ _ _  __ __   _ __   __ _  History of Present Illness   61 yo man with medical history significant for Neuroendocrine carcinoma, HTN, DM, former smoker, hospitalized in May with pericarditis and found to be in A-fib, currently on Eliquis who presents to the ED for headaches and gait instability x1 wk. He went to outpatient PCP who ordered MRI to r/o central etiology for imbalance. Outside MRI per report (images not available for my review) showed subacute cerebellar stroke and 2 tiny areas of enhancement suspicious for metastasis. 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 CTA H&N here showed no e/o aneurysm.   [facility] brain [facility].   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 Recommend CTA or MRA for complete evaluation.   45mtiny enhancement is seen at the periphery of right inferior cerebellar infarction, which may be due to subacute infarction. However, metastasis cannot be excluded given the history of gastric neuroendocrine tumor. Recommend MRI with contrast follow-up in 6-8 weeks.   A 51m58mnhancing nodule in the right parietal parietal lobe cortex, which is indeterminate but also suspicious for metastasis.   Stroke workup this admission:  CT head:   1. Small infarct within the right cerebellar hemisphere, new from the prior head CT of 06/27/2022 and likely acute/subacute. 2. Paranasal sinus disease, as described.   CTA neck:   The common carotid, internal carotid and vertebral arteries are patent within the neck without stenosis. Mild  atherosclerotic plaque about both carotid bifurcations.   CTA head:   1. No intracranial large vessel occlusion or proximal high-grade arterial stenosis identified. 2. Nonstenotic atherosclerotic plaque within the intracranial ICAs, bilaterally.  CNS imaging personally reviewed  TTE: LEV 60-65%, no other sig abnl, no intracardiac clot  Stroke Labs     Component Value Date/Time   CHOL 171 07/27/2022 0506   TRIG 221 (H) 07/27/2022 0506   HDL 26 (L) 07/27/2022 0506   CHOLHDL 6.6 07/27/2022 0506   VLDL 44 (H) 07/27/2022 0506   LDLCALC 101 (H) 07/27/2022 0506    Lab Results  Component Value Date/Time   HGBA1C 6.0 (H) 03/24/2022 08:33 PM      ROS   Per HPI: all other systems reviewed and are negative  Past History   I have reviewed the following:  Past Medical History:  Diagnosis Date   Anxiety    Arthritis    knees, hands   Cancer ([facility]  CHF (congestive heart failure) ([facility]  Diabetes mellitus without complication ([facility]  type 2   ED (erectile dysfunction)    Elevated PSA    GERD (gastroesophageal reflux disease)    Heartburn    History of BPH    HLD (hyperlipidemia)    HTN (hypertension)    Neuroendocrine carcinoma of stomach ([facility]/07/2021   Pneumonia    several times   Past Surgical History:  Procedure Laterality Date   APPENDECTOMY  2005   BIOPSY  03/19/2021   Procedure: BIOPSY;  Surgeon: ManIrving CopasMD;  [facility];  Service: Gastroenterology;;   BIOPSY  05/13/2022   Procedure: BIOPSY;  Surgeon: Irving Copas., MD;  Location: [place];  Service: Gastroenterology;;   [place]   c4-c7   CHOLECYSTECTOMY  2005   COLONOSCOPY WITH PROPOFOL N/A 12/31/2020   Procedure: COLONOSCOPY WITH PROPOFOL;  Surgeon: Virgel Manifold, MD;  Location: [facility];  Service: Endoscopy;  Laterality: N/A;   COLONOSCOPY WITH PROPOFOL N/A 03/19/2021   Procedure: COLONOSCOPY WITH PROPOFOL;  Surgeon:  Rush Landmark Telford Nab., MD;  Location: [place];  Service: Gastroenterology;  Laterality: N/A;   COLONOSCOPY WITH PROPOFOL N/A 05/13/2022   Procedure: COLONOSCOPY WITH PROPOFOL;  Surgeon: Rush Landmark Telford Nab., MD;  Location: [place];  Service: Gastroenterology;  Laterality: N/A;   ENDOSCOPIC MUCOSAL RESECTION  03/19/2021   Procedure: ENDOSCOPIC MUCOSAL RESECTION;  Surgeon: Rush Landmark Telford Nab., MD;  Location: [place];  Service: Gastroenterology;;   ENDOSCOPIC MUCOSAL RESECTION N/A 05/13/2022   Procedure: ENDOSCOPIC MUCOSAL RESECTION;  Surgeon: Irving Copas., MD;  Location: [place];  Service: Gastroenterology;  Laterality: N/A;   ESOPHAGOGASTRODUODENOSCOPY (EGD) WITH PROPOFOL N/A 12/31/2020   Procedure: ESOPHAGOGASTRODUODENOSCOPY (EGD) WITH PROPOFOL;  Surgeon: Virgel Manifold, MD;  Location: [facility];  Service: Endoscopy;  Laterality: N/A;   ESOPHAGOGASTRODUODENOSCOPY (EGD) WITH PROPOFOL N/A 03/19/2021   Procedure: ESOPHAGOGASTRODUODENOSCOPY (EGD) WITH PROPOFOL;  Surgeon: Rush Landmark Telford Nab., MD;  Location: [place];  Service: Gastroenterology;  Laterality: N/A;   ESOPHAGOGASTRODUODENOSCOPY (EGD) WITH PROPOFOL N/A 05/13/2022   Procedure: ESOPHAGOGASTRODUODENOSCOPY (EGD) WITH PROPOFOL;  Surgeon: Rush Landmark Telford Nab., MD;  Location: [place];  Service: Gastroenterology;  Laterality: N/A;   HEMOSTASIS CLIP PLACEMENT  03/19/2021   Procedure: HEMOSTASIS CLIP PLACEMENT;  Surgeon: Irving Copas., MD;  Location: [place];  Service: Gastroenterology;;   HEMOSTASIS CLIP PLACEMENT  05/13/2022   Procedure: HEMOSTASIS CLIP PLACEMENT;  Surgeon: Irving Copas., MD;  Location: [place];  Service: Gastroenterology;;   MULTIPLE TOOTH EXTRACTIONS     dentures   POLYPECTOMY  03/19/2021   Procedure: POLYPECTOMY;  Surgeon: Irving Copas., MD;  Location: [place];  Service: Gastroenterology;;   POLYPECTOMY  05/13/2022    Procedure: POLYPECTOMY;  Surgeon: Irving Copas., MD;  Location: [place];  Service: Gastroenterology;;  EGD and colon   SUBMUCOSAL LIFTING INJECTION  03/19/2021   Procedure: SUBMUCOSAL LIFTING INJECTION;  Surgeon: Irving Copas., MD;  Location: [place];  Service: Gastroenterology;;   SUBMUCOSAL LIFTING INJECTION  05/13/2022   Procedure: SUBMUCOSAL LIFTING INJECTION;  Surgeon: Irving Copas., MD;  Location: [place];  Service: Gastroenterology;;   SUBMUCOSAL TATTOO INJECTION  05/13/2022   Procedure: SUBMUCOSAL TATTOO INJECTION;  Surgeon: Irving Copas., MD;  Location: [place];  Service: Gastroenterology;;   TONSILLECTOMY     UPPER ESOPHAGEAL ENDOSCOPIC ULTRASOUND (EUS) N/A 03/19/2021   Procedure: UPPER ESOPHAGEAL ENDOSCOPIC ULTRASOUND (EUS);  Surgeon: Irving Copas., MD;  Location: [place];  Service: Gastroenterology;  Laterality: N/A;   Family History  Problem Relation Age of Onset   Kidney cancer Maternal Grandmother    Liver cancer Maternal Grandmother    Stroke Father    Cancer Father        unknown origin   Hypertension Mother    Diabetes Mother    Osteoarthritis Mother    Osteoporosis Mother    Lung cancer Maternal Grandfather    Kidney disease Neg Hx    Prostate cancer Neg Hx    Bladder Cancer Neg Hx    Colon cancer  Neg Hx    Esophageal cancer Neg Hx    Inflammatory bowel disease Neg Hx    Pancreatic cancer Neg Hx    Rectal cancer Neg Hx    Stomach cancer Neg Hx    Social History   Socioeconomic History   Marital status: Married    Spouse name: Not on file   Number of children: 3   Years of education: Not on file   Highest education level: Not on file  Occupational History   Not on file  Tobacco Use   Smoking status: Former    Years: 14.00    Types: Cigarettes    Quit date: 03/05/2004    Years since quitting: 18.4   Smokeless tobacco: Current    Types: Chew  Vaping Use   Vaping Use: Never used   Substance and Sexual Activity   Alcohol use: Yes    Alcohol/week: 1.0 standard drink of alcohol    Types: 1 Cans of beer per week    Comment: Occasionally   Drug use: Yes    Frequency: 1.0 times per week    Types: Marijuana   Sexual activity: Yes  Other Topics Concern   Not on file  Social History Narrative   Not on file   Social Determinants of Health   Financial Resource Strain: Not on file  Food Insecurity: No Food Insecurity (07/27/2022)   Hunger Vital Sign    Worried About Running Out of Food in the Last Year: Never true    Ran Out of Food in the Last Year: Never true  Transportation Needs: No Transportation Needs (07/27/2022)   PRAPARE - Hydrologist (Medical): No    Lack of Transportation (Non-Medical): No  Physical Activity: Not on file  Stress: Not on file  Social Connections: Not on file   No Known Allergies  Medications   No medications prior to admission.     No current facility-administered medications for this encounter.  Current Outpatient Medications:    acetaminophen (TYLENOL) 650 MG CR tablet, Take 1,300 mg by mouth See admin instructions. Take 1300 mg in the morning, may take a second 1300 mg dose during the day as needed for pain, Disp: , Rfl:    apixaban (ELIQUIS) 5 MG TABS tablet, Take 1 tablet (5 mg total) by mouth 2 (two) times daily., Disp: 60 tablet, Rfl: 6   famotidine (PEPCID) 20 MG tablet, Take 1 tablet (20 mg total) by mouth 2 (two) times daily., Disp: 60 tablet, Rfl: 0   Magnesium 250 MG TABS, Take 250 mg by mouth 2 (two) times daily., Disp: , Rfl:    metFORMIN (GLUCOPHAGE) 1000 MG tablet, Take 1,000 mg by mouth daily with breakfast., Disp: , Rfl:    metoprolol tartrate (LOPRESSOR) 50 MG tablet, Take 50 mg by mouth 2 (two) times daily., Disp: , Rfl:    sucralfate (CARAFATE) 1 g tablet, Take 1 tablet (1 g total) by mouth 4 (four) times daily., Disp: 120 tablet, Rfl: 1   tamsulosin (FLOMAX) 0.4 MG CAPS capsule,  Take 1 capsule (0.4 mg total) by mouth daily., Disp: 90 capsule, Rfl: 3   albuterol (VENTOLIN HFA) 108 (90 Base) MCG/ACT inhaler, Inhale 2 puffs into the lungs every 6 (six) hours as needed for wheezing or shortness of breath., Disp: , Rfl:    amLODipine (NORVASC) 5 MG tablet, Take 1 tablet (5 mg total) by mouth daily., Disp: 30 tablet, Rfl: 0   atorvastatin (LIPITOR) 40 MG tablet,  Take 1 tablet (40 mg total) by mouth daily., Disp: 30 tablet, Rfl: 0   colchicine 0.6 MG tablet, Take 0.6 mg by mouth 2 (two) times daily as needed (gout)., Disp: , Rfl:    cyclobenzaprine (FLEXERIL) 10 MG tablet, Take 10 mg by mouth 3 (three) times daily as needed for muscle spasms., Disp: , Rfl:    diclofenac Sodium (VOLTAREN) 1 % GEL, Apply 2 g topically daily as needed (pain)., Disp: , Rfl:    enalapril (VASOTEC) 10 MG tablet, Take 1 tablet (10 mg total) by mouth daily., Disp: 30 tablet, Rfl: 0   gabapentin (NEURONTIN) 300 MG capsule, Take 300 mg p.o. every morning, take 600 mg p.o. nightly, Disp: 90 capsule, Rfl: 0   ketorolac (TORADOL) 10 MG tablet, Take 1 tablet (10 mg total) by mouth every 6 (six) hours as needed for moderate pain., Disp: 12 tablet, Rfl: 0   lidocaine (LIDODERM) 5 %, Place 1 patch onto the skin daily as needed (pain/spasms). Remove & Discard patch within 12 hours or as directed by MD, Disp: , Rfl:    metoCLOPramide (REGLAN) 10 MG tablet, Take 1 tablet (10 mg total) by mouth every 6 (six) hours as needed., Disp: 30 tablet, Rfl: 0   traMADol (ULTRAM) 50 MG tablet, Take 50 mg by mouth 4 (four) times daily as needed., Disp: , Rfl:   Vitals   Vitals:   07/28/22 0814 07/28/22 1140 07/28/22 1507 07/28/22 1625  BP: (!) 151/93 (!) 157/83 (!) 158/84 (!) 167/79  Pulse: 70 67 64 73  Resp: '18 16 18 18  '$ Temp: 97.7 F (36.5 C) 98 F (36.7 C) 97.6 F (36.4 C) 98.2 F (36.8 C)  TempSrc:  Oral Oral   SpO2: 98% 99% 98% 100%  Weight:      Height:         Body mass index is 24.71 kg/m.  Physical Exam    Physical Exam Gen: A&O x4, NAD HEENT: Atraumatic, normocephalic;mucous membranes moist; oropharynx clear, tongue without atrophy or fasciculations. Neck: Supple, trachea midline. Resp: CTAB, no w/r/r CV: RRR, no m/g/r; nml S1 and S2. 2+ symmetric peripheral pulses. Abd: soft/NT/ND; nabs x 4 quad Extrem: Nml bulk; no cyanosis, clubbing, or edema.  Neuro: *MS: A&O x4. Follows multi-step commands.  *Speech: fluid, mild dysarthria, able to name and repeat *CN:    I: Deferred   II,III: PERRLA, VFF by confrontation, optic discs unable to be visualized 2/2 pupillary constriction   III,IV,VI: EOMI w/o nystagmus, no ptosis   V: Sensation intact from V1 to V3 to LT   VII: Eyelid closure was full.  Smile symmetric.   VIII: Hearing intact to voice   IX,X: Voice normal, palate elevates symmetrically    XI: SCM/trap 5/5 bilat   XII: Tongue protrudes midline, no atrophy or fasciculations   *Motor:   Normal bulk.  No tremor, rigidity or bradykinesia. No pronator drift.    Strength: Dlt Bic Tri WrE WrF FgS Gr HF KnF KnE PlF DoF    Left '5 5 5 5 5 5 5 5 5 5 5 5    '$ Right '5 5 5 5 5 5 5 5 5 5 5 5    '$ *Sensory: Intact to light touch, pinprick, temperature vibration throughout. Symmetric. Propioception intact bilat.  No double-simultaneous extinction.  *Coordination:  Finger-to-nose, heel-to-shin, rapid alternating motions were intact. *Reflexes:  2+ and symmetric throughout without clonus; toes down-going bilat *Gait: deferred  NIHSS = 1 for dysarthria  Premorbid mRS = 1  Labs   CBC:  Recent Labs  Lab 07/26/22 1655  WBC 7.7  NEUTROABS 4.2  HGB 14.7  HCT 43.4  MCV 80.7  PLT 147    Basic Metabolic Panel:  Lab Results  Component Value Date   NA 138 07/28/2022   K 3.9 07/28/2022   CO2 25 07/28/2022   GLUCOSE 111 (H) 07/28/2022   BUN 6 (L) 07/28/2022   CREATININE 0.94 07/28/2022   CALCIUM 9.2 07/28/2022   GFRNONAA >60 07/28/2022   GFRAA >60 03/14/2018   Lipid Panel:  Lab  Results  Component Value Date   LDLCALC 101 (H) 07/27/2022   HgbA1c:  Lab Results  Component Value Date   HGBA1C 6.0 (H) 03/24/2022   Urine Drug Screen:     Component Value Date/Time   LABOPIA POSITIVE (A) 03/24/2022 1323   COCAINSCRNUR NONE DETECTED 03/24/2022 1323   LABBENZ NONE DETECTED 03/24/2022 1323   AMPHETMU NONE DETECTED 03/24/2022 1323   THCU POSITIVE (A) 03/24/2022 1323   LABBARB NONE DETECTED 03/24/2022 1323    Alcohol Level No results found for: "ETH"   Impression   61 yo man with medical history significant for Neuroendocrine carcinoma, HTN, DM, former smoker, hospitalized in May with pericarditis and found to be in A-fib, on eliquis prior to admission who presented to the ED for headaches and gait instability x1 wk. He went to outpatient PCP who ordered MRI to r/o central etiology for imbalance which showed a subacute R cerebellar stroke. He was admitted to [facility] for stroke workup which is now complete. Aside from dysarthria (unknown if this is baseline) patient has no significant deficits from the cerebellar stroke and is not ataxic on examination and is able to ambulate w/ PT safely without assistance or concern for falls.  Eliquis was held on admission to [facility] in setting of subacute ischemic infarct. I requested the actual images to be shared with [place] from [facility] but they were not sent.  Patient's wife was able to pull up the actual images on their MyChart for my review.  The cerebellar stroke was relatively small and did occur approximately 1 week ago therefore I feel comfortable restarting Eliquis at this time.  On CT head here on 9/25 there is no evidence of interval hemorrhagic conversion.  Of note the MRI brain report from [facility] described 2 small areas of enhancement suspicious for possible malignancy. I will reach out to his prior oncologist Dr. Altamease Oiler and also his PCP to ensure he is scheduled for close f/u and further evaluation for these findings.   The MRI brain  report also questioned a possible saccular aneurysm in the right AICA however CTA head and neck here showed no evidence of aneurysm.  Recommendations   # Subacute R cerebellar ischemic infarct - Stroke workup is now completed - OK to restart eliquis home dose. No neurologic indication for antiplatelets in addition to therapeutic anticoagulation. - Atorvastatin '40mg'$  daily - PT/OT/SLP - Stroke education - Patient may f/u with his established outpatient neurologist Dr. Wilhemina Bonito   # Neuroendocrine neoplasm of stomach # New MRI brain findings c/f metastasis  - I will reach out to patient's former oncologist Dr. Altamease Oiler and also his PCP to ensure he is scheduled for close f/u and further evaluation for these findings.   Neurology to sign off, but please re-engage if new neurologic concerns arise. Hospital discharge is planned for today.  ______________________________________________________________________   Thank you for the opportunity to take part in the care of this  patient. If you have any further questions, please contact the neurology consultation attending.  Signed,  Su Monks, MD [facility] [phone]  If 7pm- 7am, please page neurology on call as listed in [place].  **Any copied and pasted documentation in this note was written by me in another application not billed for and pasted by me into this document.

## 2022-07-30 NOTE — Progress Notes (Signed)
Dr. Altamease Oiler,  My name is Su Monks and I am one of the neurohospitalists with [facility].  This patient has seen you in the past most recently on June 11, 2021 for neuroendocrine neoplasm of the gastrointestinal tract.  At that time he did not feel that he needed scheduled follow-up unless he was found to have tumor progression or metastases outside the stomach.  Patient reported incoordination to his PCP and underwent MRI brain at [facility] 07/26/22 for further workup which showed the following:  MRI brain wwo 1) Small acute or subacute right cerebellar infarct.  2) 37m tiny enhancement is seen at the periphery of right inferior cerebellar infarction, which may be due to subacute infarction. However, metastasis cannot be excluded given the history of gastric neuroendocrine tumor. Recommend MRI with contrast follow-up in 6-8 weeks.  3) 225menhancing nodule in the right parietal parietal lobe cortex, which is indeterminate but also suspicious for metastasis.   [facility]see [facility] consult note[facility] I counseled the patient to call his PCP immediately after discharge to discuss next steps with respect to the MRI findings suspicious for mets.  After discharge I realized that he had seen you in the past that some send you this message now.  If you could schedule this patient for f/u as soon as you are able it would be greatly appreciated.  Please contact me with any questions. Cell # [phone] Thank you,  CoSu MonksD TrDaileyCC: LiRanae PlumberA (PCP), Dr. NaEmeterio ReeveRRiverview Behavioral Healthischarging physician

## 2022-12-20 ENCOUNTER — Telehealth: Payer: Self-pay | Admitting: Cardiology

## 2022-12-20 NOTE — Telephone Encounter (Signed)
Patient c/o Palpitations:  High priority if patient c/o lightheadedness, shortness of breath, or chest pain  How long have you had palpitations/irregular HR/ Afib? Are you having the symptoms now?  In and out of afib/palpitations for the past 2-3 weeks Patient states he is currently experiencing palpitations   Are you currently experiencing lightheadedness, SOB or CP?  No   Do you have a history of afib (atrial fibrillation) or irregular heart rhythm?  Yes   Have you checked your BP or HR? (document readings if available):  BP averages 123-130/73-78 HR averages around 67  Are you experiencing any other symptoms?  Dizziness--feels faint standing and taking a few steps, headaches

## 2022-12-20 NOTE — Telephone Encounter (Signed)
Pt called reporting he's been experiencing episodes afib. He reports symptoms are increasing in severity and noticing them more throughout the day. He report roughly 8-10 times. He reports he feels dizzy during episodes and occasionally have to stand for a few mintues because he will feel like he's going to pass out. Pt denies symptoms currently.  BP averages 123-130/73-78 HR averages around 67-70  Appointment scheduled for 2/20 for further evaluations. Pt made aware of ED precaution should any new symptoms develop or worsen.

## 2022-12-21 ENCOUNTER — Encounter: Payer: Self-pay | Admitting: Cardiology

## 2022-12-21 ENCOUNTER — Ambulatory Visit: Payer: Medicare HMO | Attending: Cardiology | Admitting: Cardiology

## 2022-12-21 ENCOUNTER — Ambulatory Visit (INDEPENDENT_AMBULATORY_CARE_PROVIDER_SITE_OTHER): Payer: Medicare HMO

## 2022-12-21 VITALS — BP 146/82 | HR 66 | Ht 70.0 in | Wt 172.2 lb

## 2022-12-21 DIAGNOSIS — I480 Paroxysmal atrial fibrillation: Secondary | ICD-10-CM

## 2022-12-21 DIAGNOSIS — E782 Mixed hyperlipidemia: Secondary | ICD-10-CM

## 2022-12-21 DIAGNOSIS — R42 Dizziness and giddiness: Secondary | ICD-10-CM | POA: Diagnosis not present

## 2022-12-21 DIAGNOSIS — I10 Essential (primary) hypertension: Secondary | ICD-10-CM | POA: Diagnosis not present

## 2022-12-21 NOTE — Patient Instructions (Signed)
Medication Instructions:   Your physician recommends that you continue on your current medications as directed. Please refer to the Current Medication list given to you today.  *If you need a refill on your cardiac medications before your next appointment, please call your pharmacy*   Lab Work:  None Ordered  If you have labs (blood work) drawn today and your tests are completely normal, you will receive your results only by: [place] (if you have MyChart) OR A paper copy in the mail If you have any lab test that is abnormal or we need to change your treatment, we will call you to review the results.   Testing/Procedures:  Your physician has recommended that you wear a Zio monitor.   This monitor is a medical device that records the heart's electrical activity. Doctors most often use these monitors to diagnose arrhythmias. Arrhythmias are problems with the speed or rhythm of the heartbeat. The monitor is a small device applied to your chest. You can wear one while you do your normal daily activities. While wearing this monitor if you have any symptoms to push the button and record what you felt. Once you have worn this monitor for the period of time provider prescribed (Usually 14 days), you will return the monitor device in the postage paid box. Once it is returned they will download the data collected and provide [place] with a report which the provider will then review and we will call you with those results. Important tips:  Avoid showering during the first 24 hours of wearing the monitor. Avoid excessive sweating to help maximize wear time. Do not submerge the device, no hot tubs, and no swimming pools. Keep any lotions or oils away from the patch. After 24 hours you may shower with the patch on. Take brief showers with your back facing the shower head.  Do not remove patch once it has been placed because that will interrupt data and decrease adhesive wear time. Push the button  when you have any symptoms and write down what you were feeling. Once you have completed wearing your monitor, remove and place into box which has postage paid and place in your outgoing mailbox.  If for some reason you have misplaced your box then call our office and we can provide another box and/or mail it off for you.      Follow-Up: At [facility], you and your health needs are our priority.  As part of our continuing mission to provide you with exceptional heart care, we have created designated Provider Care Teams.  These Care Teams include your primary Cardiologist (physician) and Advanced Practice Providers (APPs -  Physician Assistants and Nurse Practitioners) who all work together to provide you with the care you need, when you need it.  We recommend signing up for the patient portal called "MyChart".  Sign up information is provided on this After Visit Summary.  MyChart is used to connect with patients for Virtual Visits (Telemedicine).  Patients are able to view lab/test results, encounter notes, upcoming appointments, etc.  Non-urgent messages can be sent to your provider as well.   To learn more about what you can do with MyChart, go to [email or web address].    Your next appointment:    After Zio   Provider:   You may see Ida Rogue, MD or one of the following Advanced Practice Providers on your designated Care Team:   Murray Hodgkins, NP Christell Faith, PA-C Cadence Kathlen Mody, PA-C Gerrie Nordmann, NP

## 2022-12-21 NOTE — Progress Notes (Addendum)
Cardiology Office Note:    Date:  12/21/2022   ID:  Kevin Esparza, DOB Feb 07, 1961, MRN [id number]  PCP:  Ranae Plumber, [place] Prov[facility] Cardiologist:  Kate Sable, MD     Referring MD: Ranae Plumber, [place]   Chief Complaint  Patient presents with   Follow-up    Afib, Dizziness, left arm pain, SOB     History of Present Illness:    Kevin Esparza is a 62 y.o. male with a hx of paroxysmal atrial fibrillation, hypertension, diabetes, GERD, former smoker x 20 years presenting for follow-up.  He has been experiencing symptoms of dizziness, palpitations over the last several weeks.  Palpitations last anywhere from a few minutes to 30 minutes.  Symptoms associated with shortness of breath.  He also states feeling dizzy, not related with palpitations.  Saw neurologist, diagnosed with a tension headache, prescribed Zanaflex for this.  Compliant with medications as prescribed, blood pressures have been normal.  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   Pneumonia    several times    Past Surgical History:  Procedure Laterality Date   APPENDECTOMY  2005   BIOPSY  03/19/2021   Procedure: BIOPSY;  Surgeon: Irving Copas., MD;  Location: [facility];  Service: Gastroenterology;;   BIOPSY  05/13/2022   Procedure: BIOPSY;  Surgeon: Irving Copas., MD;  Location: [place];  Service:  Gastroenterology;;   [place]   c4-c7   CHOLECYSTECTOMY  2005   COLONOSCOPY WITH PROPOFOL N/A 12/31/2020   Procedure: COLONOSCOPY WITH PROPOFOL;  Surgeon: Virgel Manifold, MD;  Location: [facility];  Service: Endoscopy;  Laterality: N/A;   COLONOSCOPY WITH PROPOFOL N/A 03/19/2021   Procedure: COLONOSCOPY WITH PROPOFOL;  Surgeon: Rush Landmark Telford Nab., MD;  Location: [facility];  Service: Gastroenterology;  Laterality: N/A;   COLONOSCOPY WITH PROPOFOL N/A 05/13/2022   Procedure: COLONOSCOPY WITH PROPOFOL;  Surgeon: Rush Landmark Telford Nab., MD;  Location: [facility] [place];  Service: Gastroenterology;  Laterality: N/A;   ENDOSCOPIC MUCOSAL RESECTION  03/19/2021   Procedure: ENDOSCOPIC MUCOSAL RESECTION;  Surgeon: Rush Landmark Telford Nab., MD;  Location: [place];  Service: Gastroenterology;;   ENDOSCOPIC MUCOSAL RESECTION N/A 05/13/2022   Procedure: ENDOSCOPIC MUCOSAL RESECTION;  Surgeon: Irving Copas., MD;  Location: [facility];  Service: Gastroenterology;  Laterality: N/A;   ESOPHAGOGASTRODUODENOSCOPY (EGD) WITH PROPOFOL N/A 12/31/2020   Procedure: ESOPHAGOGASTRODUODENOSCOPY (EGD) WITH PROPOFOL;  Surgeon: Virgel Manifold, MD;  Location: [facility];  Service: Endoscopy;  Laterality: N/A;   ESOPHAGOGASTRODUODENOSCOPY (EGD) WITH PROPOFOL N/A 03/19/2021   Procedure: ESOPHAGOGASTRODUODENOSCOPY (EGD) WITH PROPOFOL;  Surgeon: Rush Landmark Telford Nab., MD;  Location: [facility];  Service: Gastroenterology;  Laterality: N/A;   ESOPHAGOGASTRODUODENOSCOPY (EGD) WITH PROPOFOL N/A 05/13/2022   Procedure: ESOPHAGOGASTRODUODENOSCOPY (EGD) WITH PROPOFOL;  Surgeon: Rush Landmark Telford Nab., MD;  Location: [place];  Service: Gastroenterology;  Laterality: N/A;   HEMOSTASIS CLIP PLACEMENT  03/19/2021   Procedure: HEMOSTASIS CLIP PLACEMENT;  Surgeon: Irving Copas., MD;  Location: [place];  Service: Gastroenterology;;   HEMOSTASIS CLIP PLACEMENT   05/13/2022   Procedure: HEMOSTASIS CLIP PLACEMENT;  Surgeon: Irving Copas., MD;  Location: [place];  Service: Gastroenterology;;   MULTIPLE TOOTH EXTRACTIONS     dentures   POLYPECTOMY  03/19/2021   Procedure: POLYPECTOMY;  Surgeon: Irving Copas., MD;  Location: [facility];  Service: Gastroenterology;;   POLYPECTOMY  05/13/2022   Procedure: POLYPECTOMY;  Surgeon: Irving Copas., MD;  Location: [facility];  Service: Gastroenterology;;  EGD and colon   SUBMUCOSAL LIFTING INJECTION  03/19/2021   Procedure: SUBMUCOSAL LIFTING INJECTION;  Surgeon: Irving Copas., MD;  Location: [place];  Service: Gastroenterology;;   SUBMUCOSAL LIFTING INJECTION  05/13/2022   Procedure: SUBMUCOSAL LIFTING INJECTION;  Surgeon: Irving Copas., MD;  Location: [place];  Service: Gastroenterology;;   SUBMUCOSAL TATTOO INJECTION  05/13/2022   Procedure: SUBMUCOSAL TATTOO INJECTION;  Surgeon: Irving Copas., MD;  Location: [facility];  Service: Gastroenterology;;   TONSILLECTOMY     UPPER ESOPHAGEAL ENDOSCOPIC ULTRASOUND (EUS) N/A 03/19/2021   Procedure: UPPER ESOPHAGEAL ENDOSCOPIC ULTRASOUND (EUS);  Surgeon: Irving Copas., MD;  Location: [facility];  Service: Gastroenterology;  Laterality: N/A;    Current Medications: Current Meds  Medication Sig   acetaminophen (TYLENOL) 650 MG CR tablet Take 1,300 mg by mouth See admin instructions. Take 1300 mg in the morning, may take a second 1300 mg dose during the day as needed for pain   albuterol (VENTOLIN HFA) 108 (90 Base) MCG/ACT inhaler Inhale 2 puffs into the lungs every 6 (six) hours as needed for wheezing or shortness of breath.   amLODipine (NORVASC) 5 MG tablet Take 1 tablet (5 mg total) by mouth daily.   apixaban (ELIQUIS) 5 MG TABS tablet Take 1 tablet (5 mg total) by mouth 2 (two) times daily.   atorvastatin (LIPITOR) 40 MG tablet Take 1 tablet (40 mg total) by mouth daily.    colchicine 0.6 MG tablet Take 0.6 mg by mouth 2 (two) times daily as needed (gout).   cyclobenzaprine (FLEXERIL) 10 MG tablet Take 10 mg by mouth 3 (three) times daily as needed for muscle spasms.   diclofenac Sodium (VOLTAREN) 1 % GEL Apply 2 g topically daily as needed (pain).   enalapril (VASOTEC) 10 MG tablet Take 1 tablet (10 mg total) by mouth daily.   famotidine (PEPCID) 20 MG tablet Take 1 tablet (20 mg total) by mouth 2 (two) times daily.   gabapentin (NEURONTIN) 300 MG capsule Take 300 mg p.o. every morning, take 600 mg p.o. nightly   ketorolac (TORADOL) 10 MG tablet Take 1 tablet (10 mg total) by mouth every 6 (six) hours as needed for moderate pain.   lidocaine (LIDODERM) 5 % Place 1 patch onto the skin daily as needed (pain/spasms). Remove & Discard patch within 12 hours or as directed by MD   Magnesium 250 MG TABS Take 250 mg by mouth 2 (two) times daily.   metFORMIN (GLUCOPHAGE) 1000 MG tablet Take 1,000 mg by mouth daily with breakfast.   metoCLOPramide (REGLAN) 10 MG tablet Take 1 tablet (10 mg total) by mouth every 6 (six) hours as needed.   metoprolol tartrate (LOPRESSOR) 50 MG tablet Take 50 mg by mouth 2 (two) times daily.   omeprazole (PRILOSEC OTC) 20 MG tablet Take 20 mg by mouth  daily.   sucralfate (CARAFATE) 1 g tablet Take 1 tablet (1 g total) by mouth 4 (four) times daily.   tamsulosin (FLOMAX) 0.4 MG CAPS capsule Take 1 capsule (0.4 mg total) by mouth daily.   tiZANidine (ZANAFLEX) 4 MG tablet Take 4 mg by mouth every 8 (eight) hours as needed.   traMADol (ULTRAM) 50 MG tablet Take 50 mg by mouth 4 (four) times daily as needed.     Allergies:   Patient has no known allergies.   Social History   Socioeconomic History   Marital status: Married    Spouse name: Not on file   Number of children: 3   Years of education: Not on file   Highest education level: Not on file  Occupational History   Not on file  Tobacco Use   Smoking status: Former    Years: 14.00     Types: Cigarettes    Quit date: 03/05/2004    Years since quitting: 18.8   Smokeless tobacco: Current    Types: Chew  Vaping Use   Vaping Use: Never used  Substance and Sexual Activity   Alcohol use: Yes    Alcohol/week: 1.0 standard drink of alcohol    Types: 1 Cans of beer per week    Comment: Occasionally   Drug use: Yes    Frequency: 1.0 times per week    Types: Marijuana   Sexual activity: Yes  Other Topics Concern   Not on file  Social History Narrative   Not on file   Social Determinants of Health   Financial Resource Strain: Not on file  Food Insecurity: No Food Insecurity (07/27/2022)   Hunger Vital Sign    Worried About Running Out of Food in the Last Year: Never true    Ran Out of Food in the Last Year: Never true  Transportation Needs: No Transportation Needs (07/27/2022)   PRAPARE - Hydrologist (Medical): No    Lack of Transportation (Non-Medical): No  Physical Activity: Not on file  Stress: Not on file  Social Connections: Not on file     Family History: The patient's family history includes Cancer in his father; Diabetes in his mother; Hypertension in his mother; Kidney cancer in his maternal grandmother; Liver cancer in his maternal grandmother; Lung cancer in his maternal grandfather; Osteoarthritis in his mother; Osteoporosis in his mother; Stroke in his father. There is no history of Kidney disease, Prostate cancer, Bladder Cancer, Colon cancer, Esophageal cancer, Inflammatory bowel disease, Pancreatic cancer, Rectal cancer, or Stomach cancer.  ROS:   Please see the history of present illness.     All other systems reviewed and are negative.  EKGs/Labs/Other Studies Reviewed:    The following studies were reviewed today:   EKG:  EKG is  ordered today.  The ekg ordered today demonstrates normal sinus rhythm, normal ECG.  Recent Labs: 04/02/2022: TSH 2.099 07/26/2022: ALT 21; Hemoglobin 14.7; Platelets 173 07/28/2022: BUN 6;  Creatinine, Ser 0.94; Magnesium 1.9; Potassium 3.9; Sodium 138  Recent Lipid Panel    Component Value Date/Time   CHOL 171 07/27/2022 0506   TRIG 221 (H) 07/27/2022 0506   HDL 26 (L) 07/27/2022 0506   CHOLHDL 6.6 07/27/2022 0506   VLDL 44 (H) 07/27/2022 0506   LDLCALC 101 (H) 07/27/2022 0506     Risk Assessment/Calculations:        Physical Exam:    VS:  BP (!) 146/82 (BP Location: Left Arm, Patient Position: Sitting,  Cuff Size: Normal)   Pulse 66   Ht 5\' 10"  (1.778 m)   Wt 172 lb 3.2 oz (78.1 kg)   SpO2 95%   BMI 24.71 kg/m     Wt Readings from Last 3 Encounters:  12/21/22 172 lb 3.2 oz (78.1 kg)  07/27/22 167 lb 4.8 oz (75.9 kg)  06/27/22 178 lb (80.7 kg)     GEN:  Well nourished, well developed in no acute distress HEENT: Normal NECK: No JVD; No carotid bruits CARDIAC: RRR, no murmurs, rubs, gallops RESPIRATORY:  Clear to auscultation without rales, wheezing or rhonchi  ABDOMEN: Soft, non-tender, non-distended MUSCULOSKELETAL:  No edema; No deformity  SKIN: Warm and dry NEUROLOGIC:  Alert and oriented x 3 PSYCHIATRIC:  Normal affect   ASSESSMENT:    1. Paroxysmal atrial fibrillation (HCC)   2. Primary hypertension   3. Mixed hyperlipidemia   4. Dizziness    PLAN:    In order of problems listed above:  Paroxysmal atrial fibrillation, complains of palpitations and dizziness. EKG today sinus. Place cardiac monitor to evaluate and document afib burden, continue Lopressor 50 mg twice daily, Eliquis 5 mg twice daily.  Refer to EP/A-fib clinic. Hypertension, BP elevated, usually controlled, continue Lopressor, Vasotec, Norvasc. Hyperlipidemia, continue Lipitor 40 mg with Dizziness, orthostatic vitals with no evidence of orthostasis.  Patient did feel dizzy when going from sitting to standing.  Positional vertigo likely etiology.  Cardiac monitor as above.  Follow-up in 6 weeks.     Medication Adjustments/Labs and Tests Ordered: Current medicines are reviewed  at length with the patient today.  Concerns regarding medicines are outlined above.  Orders Placed This Encounter  Procedures   Ambulatory referral to Cardiac Electrophysiology   LONG TERM MONITOR (3-14 DAYS)   EKG 12-Lead   No orders of the defined types were placed in this encounter.   Patient Instructions  Medication Instructions:   Your physician recommends that you continue on your current medications as directed. Please refer to the Current Medication list given to you today.  *If you need a refill on your cardiac medications before your next appointment, please call your pharmacy*   Lab Work:  None Ordered  If you have labs (blood work) drawn today and your tests are completely normal, you will receive your results only by: [place] (if you have MyChart) OR A paper copy in the mail If you have any lab test that is abnormal or we need to change your treatment, we will call you to review the results.   Testing/Procedures:  Your physician has recommended that you wear a Zio monitor.   This monitor is a medical device that records the heart's electrical activity. Doctors most often use these monitors to diagnose arrhythmias. Arrhythmias are problems with the speed or rhythm of the heartbeat. The monitor is a small device applied to your chest. You can wear one while you do your normal daily activities. While wearing this monitor if you have any symptoms to push the button and record what you felt. Once you have worn this monitor for the period of time provider prescribed (Usually 14 days), you will return the monitor device in the postage paid box. Once it is returned they will download the data collected and provide [place] with a report which the provider will then review and we will call you with those results. Important tips:  Avoid showering during the first 24 hours of wearing the monitor. Avoid excessive sweating to help maximize wear time.  Do not submerge the device,  no hot tubs, and no swimming pools. Keep any lotions or oils away from the patch. After 24 hours you may shower with the patch on. Take brief showers with your back facing the shower head.  Do not remove patch once it has been placed because that will interrupt data and decrease adhesive wear time. Push the button when you have any symptoms and write down what you were feeling. Once you have completed wearing your monitor, remove and place into box which has postage paid and place in your outgoing mailbox.  If for some reason you have misplaced your box then call our office and we can provide another box and/or mail it off for you.      Follow-Up: At [facility], you and your health needs are our priority.  As part of our continuing mission to provide you with exceptional heart care, we have created designated Provider Care Teams.  These Care Teams include your primary Cardiologist (physician) and Advanced Practice Providers (APPs -  Physician Assistants and Nurse Practitioners) who all work together to provide you with the care you need, when you need it.  We recommend signing up for the patient portal called "MyChart".  Sign up information is provided on this After Visit Summary.  MyChart is used to connect with patients for Virtual Visits (Telemedicine).  Patients are able to view lab/test results, encounter notes, upcoming appointments, etc.  Non-urgent messages can be sent to your provider as well.   To learn more about what you can do with MyChart, go to [email or web address].    Your next appointment:    After Zio   Provider:   You may see Ida Rogue, MD or one of the following Advanced Practice Providers on your designated Care Team:   Murray Hodgkins, NP Christell Faith, PA-C Cadence Kathlen Mody, PA-C Gerrie Nordmann, NP    Signed, Kate Sable, MD  12/21/2022 2:32 PM    [place]

## 2022-12-24 DIAGNOSIS — R42 Dizziness and giddiness: Secondary | ICD-10-CM | POA: Diagnosis not present

## 2022-12-24 DIAGNOSIS — I480 Paroxysmal atrial fibrillation: Secondary | ICD-10-CM

## 2023-01-18 ENCOUNTER — Telehealth: Payer: Self-pay | Admitting: Cardiology

## 2023-01-18 NOTE — Telephone Encounter (Signed)
Patient was returning call. Please advise ?

## 2023-01-18 NOTE — Telephone Encounter (Signed)
Called patient to review results.  No answer. LMOV to call back.

## 2023-01-18 NOTE — Telephone Encounter (Signed)
The patient has been notified of the result and verbalized understanding.  All questions (if any) were answered. Janan Ridge, [place] 01/18/2023 2:15 PM

## 2023-01-23 NOTE — Progress Notes (Unsigned)
  Electrophysiology Office Note:    Date:  01/26/2023   ID:  Kevin Esparza, DOB 1961-05-24, MRN [id number]  [place] Cardiologist:  Kate Sable, MD  [facility] Electrophysiologist:  Vickie Epley, MD   Referring MD: Kate Sable, MD   Chief Complaint: Atrial fibrillation  History of Present Illness:    Kevin Esparza is a 63 y.o. male who I am seeing today for an evaluation of atrial fibrillation at the request of Dr. Garen Lah.  The patient last saw Dr. Garen Lah December 21, 2022.  He has a history of atrial fibrillation, hypertension, diabetes, GERD, prior tobacco abuse.  Given a report of palpitations at that office visit, a heart monitor was placed.  His monitor showed no evidence of atrial fibrillation.  He had many symptom activations during the heart rhythm monitoring that corresponded to sinus rhythm.  There were episodes of atrial tachycardia that were brief.  Today he tells me he feels palpitations occasionally but these are very mild.  His biggest complaint is fatigue that he thinks is secondary to his medications.  No syncope or presyncope.  No chest pain.  No shortness of breath.      Their past medical, social and family history was reveiwed.   ROS:   Please see the history of present illness.    All other systems reviewed and are negative.  EKGs/Labs/Other Studies Reviewed:    The following studies were reviewed today:  January 18, 2023 ZIO personally reviewed   July 27, 2022 echo EF normal, 60%.  RV normal.  Mild MR.  Mild to moderate TR.     Physical Exam:    VS:  BP 130/80   Pulse 84   Ht 5\' 10"  (1.778 m)   Wt 171 lb (77.6 kg)   SpO2 98%   BMI 24.54 kg/m     Wt Readings from Last 3 Encounters:  01/26/23 171 lb (77.6 kg)  12/21/22 172 lb 3.2 oz (78.1 kg)  07/27/22 167 lb 4.8 oz (75.9 kg)     GEN:  Well nourished, well developed in no acute distress CARDIAC: RRR, no murmurs, rubs, gallops RESPIRATORY:   Clear to auscultation without rales, wheezing or rhonchi       ASSESSMENT AND PLAN:    1. Paroxysmal atrial fibrillation (HCC)   2. Primary hypertension     #Paroxysmal atrial fibrillation Low burden Continue Eliquis for stroke prophylaxis  #Palpitations I suspect he is experiencing palpitations associated with PACs and brief salvos of atrial tachycardia.  Continue metoprolol, transition to metoprolol succinate 100 mg by mouth once daily at night. Hopefully this change will help his fatigue  #Hypertension At goal today.  Recommend checking blood pressures 1-2 times per week at home and recording the values.  Recommend bringing these recordings to the primary care physician.   Follow-up with EP APP in 1 year.    Signed, Hilton Cork. Quentin Ore, MD, [facility], [facility] 01/26/2023 11:51 AM    Electrophysiology [facility]

## 2023-01-26 ENCOUNTER — Encounter: Payer: Self-pay | Admitting: Cardiology

## 2023-01-26 ENCOUNTER — Ambulatory Visit: Payer: Medicare HMO | Attending: Cardiology | Admitting: Cardiology

## 2023-01-26 VITALS — BP 130/80 | HR 84 | Ht 70.0 in | Wt 171.0 lb

## 2023-01-26 DIAGNOSIS — I10 Essential (primary) hypertension: Secondary | ICD-10-CM

## 2023-01-26 DIAGNOSIS — I480 Paroxysmal atrial fibrillation: Secondary | ICD-10-CM

## 2023-01-26 MED ORDER — METOPROLOL SUCCINATE ER 25 MG PO TB24: 25.0000 mg | ORAL_TABLET | Freq: Every evening | ORAL | 3 refills | Status: DC

## 2023-01-26 NOTE — Patient Instructions (Signed)
Medication Instructions:  Your physician has recommended you make the following change in your medication:  1) STOP taking Lopressor (metoprolol tartrate) 2) START taking Toprol XL (metoprolol succinate) 100 mg by mouth every night  *If you need a refill on your cardiac medications before your next appointment, please call your pharmacy*  Follow-Up: At [facility], you and your health needs are our priority.  As part of our continuing mission to provide you with exceptional heart care, we have created designated Provider Care Teams.  These Care Teams include your primary Cardiologist (physician) and Advanced Practice Providers (APPs -  Physician Assistants and Nurse Practitioners) who all work together to provide you with the care you need, when you need it.  Your next appointment:   1 year(s)  Provider:   You will see one of the following Advanced Practice Providers on your designated Care Team:   Tommye Standard, [place]" [place], [place], NP

## 2023-01-31 ENCOUNTER — Ambulatory Visit: Payer: Medicare HMO | Attending: Cardiology | Admitting: Cardiology

## 2023-01-31 ENCOUNTER — Encounter: Payer: Self-pay | Admitting: Cardiology

## 2023-01-31 VITALS — BP 146/76 | HR 81 | Ht 70.0 in | Wt 171.0 lb

## 2023-01-31 DIAGNOSIS — R42 Dizziness and giddiness: Secondary | ICD-10-CM | POA: Diagnosis not present

## 2023-01-31 DIAGNOSIS — I10 Essential (primary) hypertension: Secondary | ICD-10-CM | POA: Diagnosis not present

## 2023-01-31 DIAGNOSIS — I480 Paroxysmal atrial fibrillation: Secondary | ICD-10-CM | POA: Diagnosis not present

## 2023-01-31 DIAGNOSIS — E782 Mixed hyperlipidemia: Secondary | ICD-10-CM | POA: Diagnosis not present

## 2023-01-31 NOTE — Progress Notes (Signed)
Cardiology Office Note:    Date:  01/31/2023   ID:  Kevin Esparza, DOB [birth year], MRN [id number]  PCP:  Ranae Plumber, [place] [facility] Cardiologist:  Kate Sable, MD Electrophysiologist:  Vickie Epley, MD     Referring MD: Ranae Plumber, [place]   Chief Complaint  Patient presents with   Follow-up    F/U after cardiac testing; Patient states "I just don't feel right-last night and this morning." Blood glucose=190    History of Present Illness:    Kevin Esparza is a 62 y.o. male with a hx of paroxysmal atrial fibrillation, hypertension, diabetes, GERD, former smoker x 20 years presenting for follow-up.  Last seen with terms of palpitations, cardiac monitor was placed to evaluate any A-fib recurrence.  Followed up with EP/A-fib clinic, metoprolol titrate switch to Toprol-XL.  Blood pressures at home well-controlled with systolics in the [id number] usually.  Complains of ringing in his ear and dizziness with changing positions.  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   Pneumonia    several times    Past Surgical History:  Procedure Laterality Date   APPENDECTOMY  2005   BIOPSY  03/19/2021   Procedure: BIOPSY;  Surgeon: Irving Copas., MD;  Location: [place];  Service: Gastroenterology;;   BIOPSY  05/13/2022   Procedure: BIOPSY;  Surgeon: Irving Copas., MD;  Location: [place];   Service: Gastroenterology;;   [place]   c4-c7   CHOLECYSTECTOMY  2005   COLONOSCOPY WITH PROPOFOL N/A 12/31/2020   Procedure: COLONOSCOPY WITH PROPOFOL;  Surgeon: Virgel Manifold, MD;  Location: [facility] ENDOSCOPY;  Service: Endoscopy;  Laterality: N/A;   COLONOSCOPY WITH PROPOFOL N/A 03/19/2021   Procedure: COLONOSCOPY WITH PROPOFOL;  Surgeon: Rush Landmark Telford Nab., MD;  Location: [place];  Service: Gastroenterology;  Laterality: N/A;   COLONOSCOPY WITH PROPOFOL N/A 05/13/2022   Procedure: COLONOSCOPY WITH PROPOFOL;  Surgeon: Rush Landmark Telford Nab., MD;  Location: [place];  Service: Gastroenterology;  Laterality: N/A;   ENDOSCOPIC MUCOSAL RESECTION  03/19/2021   Procedure: ENDOSCOPIC MUCOSAL RESECTION;  Surgeon: Rush Landmark Telford Nab., MD;  Location: [place];  Service: Gastroenterology;;   ENDOSCOPIC MUCOSAL RESECTION N/A 05/13/2022   Procedure: ENDOSCOPIC MUCOSAL RESECTION;  Surgeon: Irving Copas., MD;  Location: [place];  Service: Gastroenterology;  Laterality: N/A;   ESOPHAGOGASTRODUODENOSCOPY (EGD) WITH PROPOFOL N/A 12/31/2020   Procedure: ESOPHAGOGASTRODUODENOSCOPY (EGD) WITH PROPOFOL;  Surgeon: Virgel Manifold, MD;  Location: [facility];  Service: Endoscopy;  Laterality: N/A;   ESOPHAGOGASTRODUODENOSCOPY (EGD) WITH PROPOFOL N/A 03/19/2021   Procedure: ESOPHAGOGASTRODUODENOSCOPY (EGD) WITH PROPOFOL;  Surgeon: Rush Landmark Telford Nab., MD;  Location: [place];  Service: Gastroenterology;  Laterality: N/A;   ESOPHAGOGASTRODUODENOSCOPY (EGD) WITH PROPOFOL N/A 05/13/2022   Procedure: ESOPHAGOGASTRODUODENOSCOPY (EGD) WITH PROPOFOL;  Surgeon: Rush Landmark Telford Nab., MD;  Location: [place];  Service: Gastroenterology;  Laterality: N/A;   HEMOSTASIS CLIP PLACEMENT  03/19/2021   Procedure: HEMOSTASIS CLIP PLACEMENT;  Surgeon: Irving Copas., MD;  Location: [place];  Service: Gastroenterology;;   HEMOSTASIS CLIP PLACEMENT   05/13/2022   Procedure: HEMOSTASIS CLIP PLACEMENT;  Surgeon: Irving Copas., MD;  Location: [place];  Service: Gastroenterology;;   MULTIPLE TOOTH EXTRACTIONS     dentures   POLYPECTOMY  03/19/2021   Procedure: POLYPECTOMY;  Surgeon: Irving Copas., MD;  Location: [place];  Service: Gastroenterology;;   POLYPECTOMY  05/13/2022   Procedure: POLYPECTOMY;  Surgeon: Irving Copas., MD;  Location: [place];  Service: Gastroenterology;;  EGD and colon   SUBMUCOSAL LIFTING INJECTION  03/19/2021   Procedure: SUBMUCOSAL LIFTING INJECTION;  Surgeon: Irving Copas., MD;  Location: [place];  Service: Gastroenterology;;   SUBMUCOSAL LIFTING INJECTION  05/13/2022   Procedure: SUBMUCOSAL LIFTING INJECTION;  Surgeon: Irving Copas., MD;  Location: [place];  Service: Gastroenterology;;   SUBMUCOSAL TATTOO INJECTION  05/13/2022   Procedure: SUBMUCOSAL TATTOO INJECTION;  Surgeon: Irving Copas., MD;  Location: [place];  Service: Gastroenterology;;   TONSILLECTOMY     UPPER ESOPHAGEAL ENDOSCOPIC ULTRASOUND (EUS) N/A 03/19/2021   Procedure: UPPER ESOPHAGEAL ENDOSCOPIC ULTRASOUND (EUS);  Surgeon: Irving Copas., MD;  Location: [place];  Service: Gastroenterology;  Laterality: N/A;    Current Medications: Current Meds  Medication Sig   acetaminophen (TYLENOL) 650 MG CR tablet Take 1,300 mg by mouth See admin instructions. Take 1300 mg in the morning, may take a second 1300 mg dose during the day as needed for pain   albuterol (VENTOLIN HFA) 108 (90 Base) MCG/ACT inhaler Inhale 2 puffs into the lungs every 6 (six) hours as needed for wheezing or shortness of breath.   amLODipine (NORVASC) 5 MG tablet Take 1 tablet (5 mg total) by mouth daily.   apixaban (ELIQUIS) 5 MG TABS tablet Take 1 tablet (5 mg total) by mouth 2 (two) times daily.   atorvastatin (LIPITOR) 40 MG tablet Take 1 tablet (40 mg total) by mouth daily.    colchicine 0.6 MG tablet Take 0.6 mg by mouth 2 (two) times daily as needed (gout).   cyclobenzaprine (FLEXERIL) 10 MG tablet Take 10 mg by mouth 3 (three) times daily as needed for muscle spasms.   enalapril (VASOTEC) 10 MG tablet Take 1 tablet (10 mg total) by mouth daily.   gabapentin (NEURONTIN) 300 MG capsule Take 300 mg p.o. every morning, take 600 mg p.o. nightly   metFORMIN (GLUCOPHAGE) 1000 MG tablet Take 1,000 mg by mouth daily with breakfast.   metoprolol succinate (TOPROL XL) 25 MG 24 hr tablet Take 1 tablet (25 mg total) by mouth every evening.   omeprazole (PRILOSEC OTC) 20 MG tablet Take 20 mg by mouth daily.   ondansetron (ZOFRAN) 8 MG tablet Take 8 mg by mouth 3 (three) times daily.   tamsulosin (FLOMAX) 0.4 MG CAPS capsule Take 1 capsule (0.4 mg total) by mouth daily.   tiZANidine (ZANAFLEX) 4 MG tablet Take 4 mg by mouth every 8 (eight) hours as needed.     Allergies:   Patient has no known allergies.   Social History   Socioeconomic History   Marital status: Married    Spouse name: Not on file   Number of children: 3   Years of education: Not on file   Highest education level: Not on file  Occupational History   Not on file  Tobacco Use  Smoking status: Former    Years: 14    Types: Cigarettes    Quit date: 03/05/2004    Years since quitting: 18.9   Smokeless tobacco: Current    Types: Chew  Vaping Use   Vaping Use: Never used  Substance and Sexual Activity   Alcohol use: Not Currently    Alcohol/week: 1.0 standard drink of alcohol    Types: 1 Cans of beer per week    Comment: Occasionally   Drug use: Yes    Frequency: 1.0 times per week    Types: Marijuana   Sexual activity: Yes  Other Topics Concern   Not on file  Social History Narrative   Not on file   Social Determinants of Health   Financial Resource Strain: Not on file  Food Insecurity: No Food Insecurity (07/27/2022)   Hunger Vital Sign    Worried About Running Out of Food in the Last  Year: Never true    Ran Out of Food in the Last Year: Never true  Transportation Needs: No Transportation Needs (07/27/2022)   PRAPARE - Hydrologist (Medical): No    Lack of Transportation (Non-Medical): No  Physical Activity: Not on file  Stress: Not on file  Social Connections: Not on file     Family History: The patient's family history includes Cancer in his father; Diabetes in his mother; Hypertension in his mother; Kidney cancer in his maternal grandmother; Liver cancer in his maternal grandmother; Lung cancer in his maternal grandfather; Osteoarthritis in his mother; Osteoporosis in his mother; Stroke in his father. There is no history of Kidney disease, Prostate cancer, Bladder Cancer, Colon cancer, Esophageal cancer, Inflammatory bowel disease, Pancreatic cancer, Rectal cancer, or Stomach cancer.  ROS:   Please see the history of present illness.     All other systems reviewed and are negative.  EKGs/Labs/Other Studies Reviewed:    The following studies were reviewed today:   EKG:  EKG is  ordered today.  The ekg ordered today demonstrates normal sinus rhythm, heart rate 81  Recent Labs: 04/02/2022: TSH 2.099 07/26/2022: ALT 21; Hemoglobin 14.7; Platelets 173 07/28/2022: BUN 6; Creatinine, Ser 0.94; Magnesium 1.9; Potassium 3.9; Sodium 138  Recent Lipid Panel    Component Value Date/Time   CHOL 171 07/27/2022 0506   TRIG 221 (H) 07/27/2022 0506   HDL 26 (L) 07/27/2022 0506   CHOLHDL 6.6 07/27/2022 0506   VLDL 44 (H) 07/27/2022 0506   LDLCALC 101 (H) 07/27/2022 0506     Risk Assessment/Calculations:        Physical Exam:    VS:  BP (!) 146/76 (BP Location: Left Arm, Patient Position: Sitting, Cuff Size: Normal)   Pulse 81   Ht 5\' 10"  (1.778 m)   Wt 171 lb (77.6 kg)   SpO2 99%   BMI 24.54 kg/m     Wt Readings from Last 3 Encounters:  01/31/23 171 lb (77.6 kg)  01/26/23 171 lb (77.6 kg)  12/21/22 172 lb 3.2 oz (78.1 kg)      GEN:  Well nourished, well developed in no acute distress HEENT: Normal NECK: No JVD; No carotid bruits CARDIAC: RRR, no murmurs, rubs, gallops RESPIRATORY:  Clear to auscultation without rales, wheezing or rhonchi  ABDOMEN: Soft, non-tender, non-distended MUSCULOSKELETAL:  No edema; No deformity  SKIN: Warm and dry NEUROLOGIC:  Alert and oriented x 3 PSYCHIATRIC:  Normal affect   ASSESSMENT:    1. Paroxysmal atrial fibrillation   2. Primary  hypertension   3. Mixed hyperlipidemia   4. Dizziness    PLAN:    In order of problems listed above:  Paroxysmal atrial fibrillation, 2-week monitor with no A-fib, paroxysmal SVT noted.  Continue Toprol-XL 25 mg daily, Eliquis 5 mg twice daily.  Appreciate input from EP. Hypertension, BP elevated, controlled at home.  Continue Toprol-XL, Vasotec, Norvasc. Hyperlipidemia, continue Lipitor 40 mg Dizziness, prior orthostatic vitals with no evidence of orthostasis.  Monitor with no significant arrhythmias.  Likely from positional vertigo, especially with chronic radiating ears.  Follow-up with PCP, consider ENT eval..  Follow-up in 6 months.     Medication Adjustments/Labs and Tests Ordered: Current medicines are reviewed at length with the patient today.  Concerns regarding medicines are outlined above.  Orders Placed This Encounter  Procedures   EKG 12-Lead   No orders of the defined types were placed in this encounter.   Patient Instructions  Medication Instructions:  Your physician recommends that you continue on your current medications as directed. Please refer to the Current Medication list given to you today.  *If you need a refill on your cardiac medications before your next appointment, please call your pharmacy*   Lab Work:  NONE  If you have labs (blood work) drawn today and your tests are completely normal, you will receive your results only by: Power (if you have MyChart) OR A paper copy in the mail If  you have any lab test that is abnormal or we need to change your treatment, we will call you to review the results.   Testing/Procedures:  NONE   Follow-Up: At [facility], you and your health needs are our priority.  As part of our continuing mission to provide you with exceptional heart care, we have created designated Provider Care Teams.  These Care Teams include your primary Cardiologist (physician) and Advanced Practice Providers (APPs -  Physician Assistants and Nurse Practitioners) who all work together to provide you with the care you need, when you need it.  We recommend signing up for the patient portal called "MyChart".  Sign up information is provided on this After Visit Summary.  MyChart is used to connect with patients for Virtual Visits (Telemedicine).  Patients are able to view lab/test results, encounter notes, upcoming appointments, etc.  Non-urgent messages can be sent to your provider as well.   To learn more about what you can do with MyChart, go to [email or web address].    Your next appointment:   6 month(s)  Provider:   You may see Kate Sable, MD or one of the following Advanced Practice Providers on your designated Care Team:   Murray Hodgkins, NP Christell Faith, PA-C Cadence Kathlen Mody, PA-C Gerrie Nordmann, NP    Signed, Kate Sable, MD  01/31/2023 11:41 AM    [place]

## 2023-01-31 NOTE — Patient Instructions (Signed)
Medication Instructions:   Your physician recommends that you continue on your current medications as directed. Please refer to the Current Medication list given to you today.  *If you need a refill on your cardiac medications before your next appointment, please call your pharmacy*   Lab Work:  NONE  If you have labs (blood work) drawn today and your tests are completely normal, you will receive your results only by: MyChart Message (if you have MyChart) OR A paper copy in the mail If you have any lab test that is abnormal or we need to change your treatment, we will call you to review the results.   Testing/Procedures:  NONE   Follow-Up: At [facility], you and your health needs are our priority.  As part of our continuing mission to provide you with exceptional heart care, we have created designated Provider Care Teams.  These Care Teams include your primary Cardiologist (physician) and Advanced Practice Providers (APPs -  Physician Assistants and Nurse Practitioners) who all work together to provide you with the care you need, when you need it.  We recommend signing up for the patient portal called "MyChart".  Sign up information is provided on this After Visit Summary.  MyChart is used to connect with patients for Virtual Visits (Telemedicine).  Patients are able to view lab/test results, encounter notes, upcoming appointments, etc.  Non-urgent messages can be sent to your provider as well.   To learn more about what you can do with MyChart, go to [email or web address].    Your next appointment:   6 month(s)  Provider:   You may see Brian Agbor-Etang, MD or one of the following Advanced Practice Providers on your designated Care Team:   Christopher Berge, NP Ryan Dunn, PA-C Cadence Furth, PA-C Sheri Hammock, NP 

## 2023-03-04 ENCOUNTER — Other Ambulatory Visit: Payer: Self-pay

## 2023-03-04 DIAGNOSIS — N138 Other obstructive and reflux uropathy: Secondary | ICD-10-CM

## 2023-03-04 DIAGNOSIS — Z87898 Personal history of other specified conditions: Secondary | ICD-10-CM

## 2023-03-14 ENCOUNTER — Other Ambulatory Visit: Payer: Medicare HMO

## 2023-03-14 ENCOUNTER — Other Ambulatory Visit: Payer: Medicare Other

## 2023-03-14 DIAGNOSIS — N138 Other obstructive and reflux uropathy: Secondary | ICD-10-CM

## 2023-03-14 DIAGNOSIS — Z87898 Personal history of other specified conditions: Secondary | ICD-10-CM

## 2023-03-15 LAB — PSA: Prostate Specific Ag, Serum: 2.6 ng/mL (ref 0.0–4.0)

## 2023-03-16 NOTE — Progress Notes (Signed)
03/17/2023 12:01 PM   Kevin Esparza 12/22/1960 [id number]  Referring provider: Shane Crutch, PA [address] [place],  [place] [address]  No chief complaint on file.  Urological history: 1. ED -contributing factors of age, HTN, CHF, DM, HLD and former smoking  2. BPH with LU TS -Tamsulosin 0.4 mg daily  3. Elevated PSA -PSA Trend 4.7 ng/mL on 08/05/2016  PSA (03/2023) 2.6  HPI: Kevin Esparza is a 62 y.o. male who presents today for yearly follow up.          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   Pneumonia    several times    Surgical History: Past Surgical History:  Procedure Laterality Date   APPENDECTOMY  2005   BIOPSY  03/19/2021   Procedure: BIOPSY;  Surgeon: Lemar Lofty., MD;  Location: [facility] ENDOSCOPY;  Service: Gastroenterology;;   BIOPSY  05/13/2022   Procedure: BIOPSY;  Surgeon: Lemar Lofty., MD;  Location: [facility] ENDOSCOPY;  Service: Gastroenterology;;   CERVICAL SPINE SURGERY  1997   c4-c7   CHOLECYSTECTOMY  2005   COLONOSCOPY WITH PROPOFOL N/A 12/31/2020   Procedure: COLONOSCOPY WITH PROPOFOL;  Surgeon: Pasty Spillers, MD;  Location: [facility] ENDOSCOPY;  Service: Endoscopy;  Laterality: N/A;   COLONOSCOPY WITH PROPOFOL N/A 03/19/2021   Procedure: COLONOSCOPY WITH PROPOFOL;  Surgeon: Meridee Score Netty Starring., MD;  Location: [facility] ENDOSCOPY;  Service: Gastroenterology;  Laterality: N/A;   COLONOSCOPY WITH PROPOFOL N/A 05/13/2022   Procedure: COLONOSCOPY WITH PROPOFOL;  Surgeon: Meridee Score Netty Starring., MD;  Location: [facility] ENDOSCOPY;  Service: Gastroenterology;  Laterality: N/A;   ENDOSCOPIC MUCOSAL RESECTION  03/19/2021   Procedure: ENDOSCOPIC MUCOSAL RESECTION;  Surgeon: Meridee Score Netty Starring., MD;  Location: [facility] ENDOSCOPY;  Service: Gastroenterology;;   ENDOSCOPIC MUCOSAL RESECTION N/A 05/13/2022   Procedure: ENDOSCOPIC MUCOSAL RESECTION;  Surgeon: Lemar Lofty., MD;  Location: [facility] ENDOSCOPY;  Service: Gastroenterology;  Laterality: N/A;   ESOPHAGOGASTRODUODENOSCOPY (EGD) WITH PROPOFOL N/A 12/31/2020   Procedure: ESOPHAGOGASTRODUODENOSCOPY (EGD) WITH PROPOFOL;  Surgeon: Pasty Spillers, MD;  Location: [facility] ENDOSCOPY;  Service: Endoscopy;  Laterality: N/A;   ESOPHAGOGASTRODUODENOSCOPY (EGD) WITH PROPOFOL N/A 03/19/2021   Procedure: ESOPHAGOGASTRODUODENOSCOPY (EGD) WITH PROPOFOL;  Surgeon: Meridee Score Netty Starring., MD;  Location: [facility] ENDOSCOPY;  Service: Gastroenterology;  Laterality: N/A;   ESOPHAGOGASTRODUODENOSCOPY (EGD) WITH PROPOFOL N/A 05/13/2022   Procedure: ESOPHAGOGASTRODUODENOSCOPY (EGD) WITH PROPOFOL;  Surgeon: Meridee Score Netty Starring., MD;  Location: [facility] ENDOSCOPY;  Service: Gastroenterology;  Laterality: N/A;   HEMOSTASIS CLIP PLACEMENT  03/19/2021   Procedure: HEMOSTASIS CLIP PLACEMENT;  Surgeon: Lemar Lofty., MD;  Location: [facility] ENDOSCOPY;  Service: Gastroenterology;;   HEMOSTASIS CLIP PLACEMENT  05/13/2022   Procedure: HEMOSTASIS CLIP PLACEMENT;  Surgeon: Lemar Lofty., MD;  Location: [facility] ENDOSCOPY;  Service: Gastroenterology;;   MULTIPLE TOOTH EXTRACTIONS     dentures   POLYPECTOMY  03/19/2021   Procedure: POLYPECTOMY;  Surgeon: Lemar Lofty., MD;  Location: [facility] ENDOSCOPY;  Service: Gastroenterology;;  POLYPECTOMY  05/13/2022   Procedure: POLYPECTOMY;  Surgeon: Mansouraty, Netty Starring., MD;  Location: [facility] ENDOSCOPY;  Service: Gastroenterology;;  EGD and colon   SUBMUCOSAL LIFTING INJECTION  03/19/2021   Procedure: SUBMUCOSAL LIFTING INJECTION;   Surgeon: Lemar Lofty., MD;  Location: [facility] ENDOSCOPY;  Service: Gastroenterology;;   SUBMUCOSAL LIFTING INJECTION  05/13/2022   Procedure: SUBMUCOSAL LIFTING INJECTION;  Surgeon: Lemar Lofty., MD;  Location: [facility] ENDOSCOPY;  Service: Gastroenterology;;   SUBMUCOSAL TATTOO INJECTION  05/13/2022   Procedure: SUBMUCOSAL TATTOO INJECTION;  Surgeon: Lemar Lofty., MD;  Location: [facility] ENDOSCOPY;  Service: Gastroenterology;;   TONSILLECTOMY     UPPER ESOPHAGEAL ENDOSCOPIC ULTRASOUND (EUS) N/A 03/19/2021   Procedure: UPPER ESOPHAGEAL ENDOSCOPIC ULTRASOUND (EUS);  Surgeon: Lemar Lofty., MD;  Location: [facility] ENDOSCOPY;  Service: Gastroenterology;  Laterality: N/A;    Home Medications:  Allergies as of 03/17/2023   No Known Allergies      Medication List        Accurate as of Mar 16, 2023 12:01 PM. If you have any questions, ask your nurse or doctor.          acetaminophen 650 MG CR tablet Commonly known as: TYLENOL Take 1,300 mg by mouth See admin instructions. Take 1300 mg in the morning, may take a second 1300 mg dose during the day as needed for pain   albuterol 108 (90 Base) MCG/ACT inhaler Commonly known as: VENTOLIN HFA Inhale 2 puffs into the lungs every 6 (six) hours as needed for wheezing or shortness of breath.   amLODipine 5 MG tablet Commonly known as: NORVASC Take 1 tablet (5 mg total) by mouth daily.   apixaban 5 MG Tabs tablet Commonly known as: Eliquis Take 1 tablet (5 mg total) by mouth 2 (two) times daily.   atorvastatin 40 MG tablet Commonly known as: LIPITOR Take 1 tablet (40 mg total) by mouth daily.   colchicine 0.6 MG tablet Take 0.6 mg by mouth 2 (two) times daily as needed (gout).   cyclobenzaprine 10 MG tablet Commonly known as: FLEXERIL Take 10 mg by mouth 3 (three) times daily as needed for muscle spasms.   enalapril 10 MG tablet Commonly known as: VASOTEC Take 1 tablet (10 mg total) by mouth daily.   gabapentin  300 MG capsule Commonly known as: NEURONTIN Take 300 mg p.o. every morning, take 600 mg p.o. nightly   metFORMIN 1000 MG tablet Commonly known as: GLUCOPHAGE Take 1,000 mg by mouth daily with breakfast.   metoprolol succinate 25 MG 24 hr tablet Commonly known as: Toprol XL Take 1 tablet (25 mg total) by mouth every evening.   omeprazole 20 MG tablet Commonly known as: PRILOSEC OTC Take 20 mg by mouth daily.   ondansetron 8 MG tablet Commonly known as: ZOFRAN Take 8 mg by mouth 3 (three) times daily.   tamsulosin 0.4 MG Caps capsule Commonly known as: FLOMAX Take 1 capsule (0.4 mg total) by mouth daily.        Allergies: No Known Allergies  Family History: Family History  Problem Relation Age of Onset   Kidney cancer Maternal Grandmother    Liver cancer Maternal Grandmother    Stroke Father    Cancer Father        unknown origin   Hypertension Mother    Diabetes Mother    Osteoarthritis Mother    Osteoporosis Mother    Lung cancer Maternal Grandfather    Kidney disease Neg Hx    Prostate cancer Neg  Hx    Bladder Cancer Neg Hx    Colon cancer Neg Hx    Esophageal cancer Neg Hx    Inflammatory bowel disease Neg Hx    Pancreatic cancer Neg Hx    Rectal cancer Neg Hx    Stomach cancer Neg Hx     Social History:  reports that he quit smoking about 19 years ago. His smoking use included cigarettes. His smokeless tobacco use includes chew. He reports that he does not currently use alcohol after a past usage of about 1.0 standard drink of alcohol per week. He reports current drug use. Frequency: 1.00 time per week. Drug: Marijuana.  ROS: For pertinent review of systems please refer to history of present illness  Physical Exam: There were no vitals taken for this visit.  Constitutional:  Well nourished. Alert and oriented, No acute distress. HEENT: [place] AT, moist mucus membranes.  Trachea midline Cardiovascular: No clubbing, cyanosis, or edema. Respiratory: Normal  respiratory effort, no increased work of breathing. GU: No CVA tenderness.  No bladder fullness or masses.  Patient with circumcised/uncircumcised phallus. ***Foreskin easily retracted***  Urethral meatus is patent.  No penile discharge. No penile lesions or rashes. Scrotum without lesions, cysts, rashes and/or edema.  Testicles are located scrotally bilaterally. No masses are appreciated in the testicles. Left and right epididymis are normal. Rectal: Patient with  normal sphincter tone. Anus and perineum without scarring or rashes. No rectal masses are appreciated. Prostate is approximately *** grams, *** nodules are appreciated. Seminal vesicles are normal. Neurologic: Grossly intact, no focal deficits, moving all 4 extremities. Psychiatric: Normal mood and affect.   Laboratory Data: Serum creatinine of 0.94 with a EGFR of greater than 60 in September 2023 I have reviewed the labs.   Pertinent Imaging N/A   Assessment & Plan:    1. BPH with LUTS -PSA stable -DRE benign *** -continue conservative management, avoiding bladder irritants and timed voiding's -Continue tamsulosin 0.4 mg daily  No follow-ups on file.  These notes generated with voice recognition software. I apologize for typographical errors.  Cloretta Ned  [facility] [address] [place], [place] [address] [phone]

## 2023-03-17 ENCOUNTER — Ambulatory Visit: Payer: Medicare HMO | Admitting: Urology

## 2023-03-17 ENCOUNTER — Encounter: Payer: Self-pay | Admitting: Urology

## 2023-03-17 VITALS — BP 143/71 | HR 80 | Ht 70.0 in | Wt 169.0 lb

## 2023-03-17 DIAGNOSIS — N401 Enlarged prostate with lower urinary tract symptoms: Secondary | ICD-10-CM | POA: Diagnosis not present

## 2023-03-17 DIAGNOSIS — R7989 Other specified abnormal findings of blood chemistry: Secondary | ICD-10-CM | POA: Diagnosis not present

## 2023-03-17 DIAGNOSIS — N529 Male erectile dysfunction, unspecified: Secondary | ICD-10-CM

## 2023-03-17 DIAGNOSIS — N138 Other obstructive and reflux uropathy: Secondary | ICD-10-CM

## 2023-03-17 DIAGNOSIS — N503 Cyst of epididymis: Secondary | ICD-10-CM

## 2023-03-18 LAB — TESTOSTERONE: Testosterone: 447 ng/dL (ref 264–916)

## 2023-03-21 ENCOUNTER — Telehealth: Payer: Self-pay

## 2023-03-21 MED ORDER — APIXABAN 5 MG PO TABS: 5.0000 mg | ORAL_TABLET | Freq: Two times a day (BID) | ORAL | 5 refills | Status: DC

## 2023-03-21 NOTE — Telephone Encounter (Signed)
Incoming fax from [facility] pharmacy  Requesting refills on Eliquis - Take one tablet (5mg ) by mouth daily.

## 2023-03-21 NOTE — Telephone Encounter (Signed)
Prescription refill request for Eliquis received. Indication: PAF Last office visit:  01/31/23  B Agbor-Etang MD Scr: 0.94 on 07/28/22  Epic Age: 62 Weight: 77.6  Based on above findings Eliquis 5mg  twice daily is the appropriate dose.  Refill approved.

## 2023-03-21 NOTE — Addendum Note (Signed)
Addended by: Louanna Raw on: 03/21/2023 09:21 AM   Modules accepted: Orders

## 2023-04-14 ENCOUNTER — Encounter: Payer: Self-pay | Admitting: Nurse Practitioner

## 2023-04-14 ENCOUNTER — Ambulatory Visit: Payer: Medicare HMO | Admitting: Nurse Practitioner

## 2023-04-14 ENCOUNTER — Telehealth: Payer: Self-pay

## 2023-04-14 VITALS — BP 110/60 | HR 70 | Ht 70.0 in | Wt 172.0 lb

## 2023-04-14 DIAGNOSIS — I4891 Unspecified atrial fibrillation: Secondary | ICD-10-CM

## 2023-04-14 DIAGNOSIS — R1310 Dysphagia, unspecified: Secondary | ICD-10-CM | POA: Diagnosis not present

## 2023-04-14 DIAGNOSIS — Z86010 Personal history of colonic polyps: Secondary | ICD-10-CM | POA: Diagnosis not present

## 2023-04-14 NOTE — Telephone Encounter (Signed)
[facility] Pre-operative Risk Assessment     Request for surgical clearance:     Endoscopy Procedure  What type of surgery is being performed?     Endo/Colon  When is this surgery scheduled?     07/19/23  What type of clearance is required ?   Pharmacy  Are there any medications that need to be held prior to surgery and how long? Eliquis & 2 days  Practice name and name of physician performing surgery?      [facility]  What is your office phone and fax number?      Phone- [phone]  Fax- [phone]  Anesthesia type (None, local, MAC, general) ?       MAC

## 2023-04-14 NOTE — Patient Instructions (Signed)
You have been scheduled for an endoscopy and colonoscopy. Please follow the written instructions given to you at your visit today. Please pick up your prep supplies at the pharmacy within the next 1-3 days. If you use inhalers (even only as needed), please bring them with you on the day of your procedure.  Due to recent changes in healthcare laws, you may see the results of your imaging and laboratory studies on MyChart before your provider has had a chance to review them.  We understand that in some cases there may be results that are confusing or concerning to you. Not all laboratory results come back in the same time frame and the provider may be waiting for multiple results in order to interpret others.  Please give us 48 hours in order for your provider to thoroughly review all the results before contacting the office for clarification of your results.    Thank you for trusting me with your gastrointestinal care!   Colleen Kennedy-Smith, CRNP   

## 2023-04-14 NOTE — Progress Notes (Unsigned)
04/14/2023 Kevin Esparza [id number] 12-17-1960   Chief Complaint: Difficulty swallowing   History of Present Illness: Kevin Esparza is a 62 y.o. male with a pmh significant for hypertension, hyperlipidemia, atrial fibrillation on Eliquis, CHF, GERD, diabetes, mellitus type II, gastric NET and colon polyps. He is followed by Dr. Meridee Score. He endorses having dysphagia since Jan 2024. Food and liquids get stuck to the mid esophagus which comes back out. He has intermittent burning acid reflux. He takes Omeprazole 20mg  every day. He has infrequent RUQ pain, occurs randomly once monthly and lasts for 2 minutes. No lower abdominal pain. He is passing loose stools daily since 2005. He stated some days he passes 15 or 20 nonbloody loose stools in one day. He takes Imodium two tabs daily. His most recent  EGD 05/13/2022 showed a non-obstructing Schatzki's ring, 3 cm hiatal hernia, hyperplastic gastric polyp without evidence of H. Pylori or recurrent NET.  His esophagus was dilated per EGD 12/2020. His dysphagia symptoms improved s/p esophageal dilatation. His most recent colonoscopy 05/13/2022 showed 11 polyps removed from the colon and rectum. Path report showed tubulovillous and tubular adenomatous polyps without dysplasia and no evidence of colitis. He was advised to repeat a colonoscopy in 1 to 2 years. See summary of endoscopic procedures below.      Latest Ref Rng & Units 07/26/2022    4:55 PM 06/27/2022   12:04 PM 04/02/2022   12:51 PM  CBC  WBC 4.0 - 10.5 K/uL 7.7  7.2  9.1   Hemoglobin 13.0 - 17.0 g/dL 95.2  84.1  32.4   Hematocrit 39.0 - 52.0 % 43.4  42.8  40.3   Platelets 150 - 400 K/uL 173  193  293        Latest Ref Rng & Units 07/28/2022    6:30 AM 07/26/2022    4:55 PM 06/27/2022   12:16 PM  CMP  Glucose 70 - 99 mg/dL 401  96    BUN 8 - 23 mg/dL 6  <5    Creatinine 0.27 - 1.24 mg/dL 2.53  6.64    Sodium 403 - 145 mmol/L 138  139    Potassium 3.5 - 5.1 mmol/L 3.9  3.3     Chloride 98 - 111 mmol/L 106  104    CO2 22 - 32 mmol/L 25  27    Calcium 8.9 - 10.3 mg/dL 9.2  9.7    Total Protein 6.5 - 8.1 g/dL  7.8  7.2   Total Bilirubin 0.3 - 1.2 mg/dL  1.0  0.9   Alkaline Phos 38 - 126 U/L  74  71   AST 15 - 41 U/L  25  21   ALT 0 - 44 U/L  21  14      PAST GI PROCEDURES:  EGD 05/13/2022: - No gross lesions in esophagus. Non-obstructing Schatzki ring. Z-line regular, 39 cm from the incisors. - 3 cm hiatal hernia. - Texture changed mucosa in the greater curvature of the distal gastric body. Removed with mucosal resection. Clip (MR conditional) was placed. Tattooed laterally. - Nodule in the gastric antrum. Removed with snare polypectomy. - Erythematous mucosa in the stomach. Biopsied. - No gross lesions in the duodenal bulb, in the first portion of the duodenum and in the second portion of the duodenum  Colonoscopy 05/13/2022: - Hemorrhoids found on digital rectal exam. - The examined portion of the ileum was normal. - Post mucosectomy scar in the cecum. This area suggested no  evidence of recurrence at this time. - 11, 3 to 14 mm polyps in the rectum, in the transverse colon, in the ascending colon and in the cecum, removed with a cold snare. Resected and retrieved. - Normal mucosa in the entire examined colon otherwise. - Non-bleeding non-thrombosed external and internal hemorrhoids.  A. STOMACH, ANTRAL NODULE, BIOPSY:  Benign hyperplastic gastric polyp.  Negative for dysplasia and malignancy.   B. STOMACH, GREATER CURVE/ DISTAL BODY, BIOPSY:  Polypoid fragments of gastric mucosa with moderate vascular ectasia and  lymphoid aggregates in the lamina propria.  Negative for dysplasia and malignancy.  No submucosal lesion is identified.   C. STOMACH, RANDOM, BIOPSY:  Fragments of gastric mucosa with no significant microscopic  abnormalities.  H.pylori, intestinal metaplasia, atrophy and dysplasia are not  identified.   D. COLON, CECUM, ASCENDING, TRANSVERSE AND  RECTUM, POLYPECTOMY:  Tubulovillous and tubular adenomas.  Negative for high-grade dysplasia.   March 2022 EGD - Mild Schatzki ring. Dilated. - Erythematous mucosa in the antrum. Biopsied. - A single submucosal papule (nodule) found in the stomach. Biopsied. - Nodular mucosa in the gastric antrum (lesser curvature). Biopsied. - A single mucosal papule (nodule) found in the stomach. Biopsied. - Normal duodenal bulb, second portion of the duodenum and examined duodenum. - Biopsies were obtained in the gastric body, at the incisura and in the gastric antrum.   March 2022 Colonoscopy - One 20 mm polyp in the cecum. Resection not attempted. Injected. - One 12 mm polyp in the ascending colon, removed using lift and cut and a hot snare. Resected and retrieved. Injected. Clip was placed. - One 12 mm polyp in the transverse colon, removed using injection-lift and a hot snare. Resected and retrieved. Injected. - One 10 mm polyp in the descending colon, removed with a jumbo cold forceps. Resected and retrieved. - Five 5 to 8 mm polyps in the sigmoid colon and in the descending colon, removed with a cold snare. Resected and retrieved. - Non-bleeding internal hemorrhoids. - The examination was otherwise normal.   Pathology DIAGNOSIS:  A.  STOMACH; COLD BIOPSY:  - ANTRAL MUCOSA WITH REACTIVE AND FOCAL MILD CHRONIC GASTRITIS,  NON-SPECIFIC.  - IHC FOR H.PYLORI IS NEGATIVE.  - NEGATIVE FOR DYSPLASIA AND MALIGNANCY.  B.  STOMACH, INCISURA; COLD BIOPSY:  - FOCAL MILD CHRONIC GASTRITIS, NON-SPECIFIC.  - NEGATIVE FOR H. PYLORI, DYSPLASIA, AND MALIGNANCY. C.  STOMACH, POLYPOID AREA; COLD BIOPSY:  - OXYNTIC MUCOSA WITH FOCAL DENSE AREA OF CHRONIC GASTRITIS INVOLVING  THE DEEP ASPECT OF THE MUCOSA, SEE COMMENT.  - IHC FOR H.PYLORI IS NEGATIVE.  - NEGATIVE FOR DYSPLASIA AND MALIGNANCY.  D.  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  - SEE COMMENT.  E.  ESOPHAGUS; COLD BIOPSY:  - SQUAMOUS MUCOSA WITH CHANGES CONSISTENT WITH REFLUX ESOPHAGITIS. - NEGATIVE FOR DYSPLASIA AND MALIGNANCY. F.  COLON POLYP, ASCENDING; HOT SNARE:  - TUBULAR ADENOMA.  - NEGATIVE FOR HIGH-GRADE DYSPLASIA AND MALIGNANCY.  G.  COLON POLYP, TRANSVERSE; HOT SNARE:  - TUBULAR ADENOMA.  - NEGATIVE FOR HIGH-GRADE DYSPLASIA AND MALIGNANCY. H.  COLON POLYP X6, DESCENDING (2) AND SIGMOID (4); COLD SNARE AND COLD  BIOPSY:  - TUBULAR ADENOMA (MULTIPLE FRAGMENTS).  - NEGATIVE FOR HIGH-GRADE DYSPLASIA AND MALIGNANCY.  Comment:  Sections of the biopsy of the lesser curvature (part D) demonstrate  oxyntic mucosa with focal infiltrative nests of epithelial cells that  span 1.8 mm and extend to the biopsy edges. Immunohistochemical  stains  demonstrate that these nests are positive for neuroendocrine markers  chromogranin and synaptophysin.  Ki-67 marks approximately 6% of  lesional nuclei. 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 Take 1300 mg in the morning, may take a second 1300 mg dose during the day as needed for pain     albuterol (VENTOLIN HFA) 108 (90 Base) MCG/ACT inhaler Inhale 2 puffs into the lungs every 6 (six) hours as needed for wheezing or shortness of breath.     amLODipine (NORVASC) 5 MG tablet Take 1 tablet (5 mg total) by mouth daily. 30 tablet 0   apixaban (ELIQUIS) 5 MG TABS tablet Take 1 tablet (5 mg total) by mouth 2 (two) times daily. 60 tablet 5   atorvastatin (LIPITOR) 40 MG tablet Take 1 tablet (40 mg total) by mouth daily. 30 tablet 0   colchicine 0.6 MG tablet Take 0.6 mg by mouth 2 (two) times daily as needed (gout).     cyclobenzaprine (FLEXERIL) 10 MG tablet Take 10 mg  by mouth 3 (three) times daily as needed for muscle spasms.     enalapril (VASOTEC) 10 MG tablet Take 1 tablet (10 mg total) by mouth daily. 30 tablet 0   gabapentin (NEURONTIN) 300 MG capsule Take 300 mg p.o. every morning, take 600 mg p.o. nightly 90 capsule 0   meclizine (ANTIVERT) 25 MG tablet Take 25 mg by mouth every 6 (six) hours as needed.     metFORMIN (GLUCOPHAGE) 1000 MG tablet Take 1,000 mg by mouth daily with breakfast.     metoprolol succinate (TOPROL XL) 25 MG 24 hr tablet Take 1 tablet (25 mg total) by mouth every evening. 90 tablet 3   omeprazole (PRILOSEC OTC) 20 MG tablet Take 20 mg by mouth daily.     ondansetron (ZOFRAN) 8 MG tablet Take 8 mg by mouth 3 (three) times daily.     tamsulosin (FLOMAX) 0.4 MG CAPS capsule Take 1 capsule (0.4 mg total) by mouth daily. 90 capsule 3   tiZANidine (ZANAFLEX) 4 MG tablet Takes 1/2 tablet in the daytime and takes 1 tablet at bedtime daily     No current facility-administered medications on file prior to visit.    No Known Allergies  Current Medications, Allergies, Past Medical History, Past Surgical History, Family History and Social History were reviewed in [employer] record.  Review of Systems:   Constitutional: Negative for fever, sweats, chills or weight loss.  Respiratory: Negative for shortness of breath.   Cardiovascular: Negative for chest pain, palpitations and leg swelling.  Gastrointestinal: See HPI.  Musculoskeletal: Negative for back pain or muscle aches.  Neurological: Negative for dizziness, headaches or paresthesias.   Physical Exam: BP 110/60   Pulse 70   Ht 5\' 10"  (1.778 m)   Wt 172 lb (78 kg)   BMI 24.68 kg/m  General: 62 year old male in no acute distress. Head: Normocephalic and atraumatic. Eyes: No scleral icterus. Conjunctiva pink . Ears: Normal auditory acuity. Mouth: Dentition intact. No ulcers or lesions.  Lungs: Clear throughout to auscultation. Heart: Regular rate and  rhythm, no murmur. Abdomen: Soft, nontender and nondistended. No masses or hepatomegaly. Normal bowel sounds x 4 quadrants.  Rectal: Deferred.  Musculoskeletal: Symmetrical with no gross deformities. Extremities:  No edema. Neurological: Alert oriented x 4. No focal deficits.  Psychological: Alert and cooperative. Normal mood and affect  Assessment and Recommendations:  40) 62 year old male with GERD and recurrent dysphagia. His most recent  EGD 05/13/2022 showed a non-obstructing Schatzki's ring, 3 cm hiatal hernia, hyperplastic gastric polyp without evidence of H. Pylori or recurrent NET. -EGD with possible esophageal dilatation   2) History of gastric NET.Chromogranin A level 65.07 February 2021  3) History of colon polyps. His most recent colonoscopy 05/13/2022 showed 11 polyps removed from the colon and rectum.Path report showed tubulovillous and tubular adenomatous polyps without dysplasia and no evidence of colitis. He was advised to repeat a colonoscopy in 1 to 2 years.  -Colonoscopy benefits and risks discussed including risk with sedation, risk of bleeding, perforation and infection. Schedule July or August 2024  4) Atrial fibrillation on Eliquis  -Our office will contact his cardiologist to verify Eliquis hold instructions prior to proceeding with EGD and colonoscopy   5) Chronic diarrhea -Imodium as needed

## 2023-04-15 ENCOUNTER — Telehealth: Payer: Self-pay | Admitting: *Deleted

## 2023-04-15 NOTE — Telephone Encounter (Signed)
Lvm for pt to call office to schedule tele visit for pre op.

## 2023-04-15 NOTE — Telephone Encounter (Signed)
Patient with diagnosis of atrial fibrillation on Eliquis for anticoagulation.    What type of surgery is being performed?     Endo/Colon  When is this surgery scheduled?     07/19/23    CHA2DS2-VASc Score = 4   This indicates a 4.8% annual risk of stroke. The patient's score is based upon: CHF History: 1 HTN History: 1 Diabetes History: 1 Stroke History: 0 Vascular Disease History: 1 Age Score: 0 Gender Score: 0    CrCl 89 Platelet count 173  Per office protocol, patient can hold Eliquis for 2 days prior to procedure.   Patient will not need bridging with Lovenox (enoxaparin) around procedure.  **This guidance is not considered finalized until pre-operative APP has relayed final recommendations.**

## 2023-04-15 NOTE — Telephone Encounter (Signed)
Primary Cardiologist:Brian Agbor-Etang, MD   Preoperative team, please contact this patient and set up a phone call appointment for further preoperative risk assessment. Please obtain consent and complete medication review. Thank you for your help.   I confirm that guidance regarding antiplatelet and oral anticoagulation therapy has been completed and, if necessary, noted below.   Levi Aland, NP-C  04/15/2023, 1:18 PM [address], [address] Office [phone] Fax [phone]

## 2023-04-15 NOTE — Telephone Encounter (Signed)
  Patient Consent for Virtual Visit         Kevin Esparza has provided verbal consent on 04/15/2023 for a virtual visit (video or telephone).   CONSENT FOR VIRTUAL VISIT FOR:  Kevin Esparza  By participating in this virtual visit I agree to the following:  I hereby voluntarily request, consent and authorize [facility] and its employed or contracted physicians, physician assistants, nurse practitioners or other licensed health care professionals (the Practitioner), to provide me with telemedicine health care services (the "Services") as deemed necessary by the treating Practitioner. I acknowledge and consent to receive the Services by the Practitioner via telemedicine. I understand that the telemedicine visit will involve communicating with the Practitioner through live audiovisual communication technology and the disclosure of certain medical information by electronic transmission. I acknowledge that I have been given the opportunity to request an in-person assessment or other available alternative prior to the telemedicine visit and am voluntarily participating in the telemedicine visit.  I understand that I have the right to withhold or withdraw my consent to the use of telemedicine in the course of my care at any time, without affecting my right to future care or treatment, and that the Practitioner or I may terminate the telemedicine visit at any time. I understand that I have the right to inspect all information obtained and/or recorded in the course of the telemedicine visit and may receive copies of available information for a reasonable fee.  I understand that some of the potential risks of receiving the Services via telemedicine include:  Delay or interruption in medical evaluation due to technological equipment failure or disruption; Information transmitted may not be sufficient (e.g. poor resolution of images) to allow for appropriate medical decision making by the  Practitioner; and/or  In rare instances, security protocols could fail, causing a breach of personal health information.  Furthermore, I acknowledge that it is my responsibility to provide information about my medical history, conditions and care that is complete and accurate to the best of my ability. I acknowledge that Practitioner's advice, recommendations, and/or decision may be based on factors not within their control, such as incomplete or inaccurate data provided by me or distortions of diagnostic images or specimens that may result from electronic transmissions. I understand that the practice of medicine is not an exact science and that Practitioner makes no warranties or guarantees regarding treatment outcomes. I acknowledge that a copy of this consent can be made available to me via my patient portal [facility] MyChart), or I can request a printed copy by calling the office of [facility].    I understand that my insurance will be billed for this visit.   I have read or had this consent read to me. I understand the contents of this consent, which adequately explains the benefits and risks of the Services being provided via telemedicine.  I have been provided ample opportunity to ask questions regarding this consent and the Services and have had my questions answered to my satisfaction. I give my informed consent for the services to be provided through the use of telemedicine in my medical care

## 2023-04-18 NOTE — Progress Notes (Signed)
Attending Physician's Attestation   I have reviewed the chart.   I agree with the Advanced Practitioner's note, impression, and recommendations with any updates as below.    Sharika Mosquera Mansouraty, MD [facility] Advanced Endoscopy Office # [phone]  

## 2023-04-20 ENCOUNTER — Ambulatory Visit: Payer: Medicare HMO | Admitting: Urology

## 2023-04-20 VITALS — BP 113/70 | HR 80 | Ht 70.0 in | Wt 171.2 lb

## 2023-04-20 DIAGNOSIS — N401 Enlarged prostate with lower urinary tract symptoms: Secondary | ICD-10-CM | POA: Diagnosis not present

## 2023-04-20 DIAGNOSIS — N138 Other obstructive and reflux uropathy: Secondary | ICD-10-CM

## 2023-04-20 LAB — URINALYSIS, COMPLETE
Bilirubin, UA: NEGATIVE
Glucose, UA: NEGATIVE
Ketones, UA: NEGATIVE
Leukocytes,UA: NEGATIVE
Nitrite, UA: NEGATIVE
Protein,UA: NEGATIVE
RBC, UA: NEGATIVE
Specific Gravity, UA: 1.015 (ref 1.005–1.030)
Urobilinogen, Ur: 0.2 mg/dL (ref 0.2–1.0)
pH, UA: 5.5 (ref 5.0–7.5)

## 2023-04-20 LAB — MICROSCOPIC EXAMINATION

## 2023-04-20 NOTE — Progress Notes (Signed)
04/20/23  Chief Complaint  Patient presents with   Cysto     HPI: 62 y.o. year-old male with refractory urinary symptoms related to BPH who presents today to the office for cystoscopy and prostate sizing.   Please see previous notes for details.     Blood pressure 113/70, pulse 80, height 5\' 10"  (1.778 m), weight 171 lb 4 oz (77.7 kg). NED. A&Ox3.   No respiratory distress   Abd soft, NT, ND Normal phallus with bilateral descended testicles    Cystoscopy Procedure Note  Patient identification was confirmed, informed consent was obtained, and patient was prepped using Betadine solution.  Lidocaine jelly was administered per urethral meatus.     Pre-Procedure: - Inspection reveals a normal caliber ureteral meatus.  Procedure: The flexible cystoscope was introduced without difficulty - No urethral strictures/lesions are present. - Enlarged prostate bilobar coaptation - Normal bladder neck - Bilateral ureteral orifices identified - Bladder mucosa  reveals no ulcers, tumors, or lesions - No bladder stones - Mild trabeculation  Retroflexion shows slight intravesical protrusion without it discrete median lobe   Post-Procedure: - Patient tolerated the procedure well   Prostate transrectal ultrasound sizing   Informed consent was obtained after discussing risks/benefits of the procedure.  A time out was performed to ensure correct patient identity.   Pre-Procedure: -Transrectal probe was placed without difficulty -Transrectal Ultrasound performed revealing a 83.62 gm prostate measuring 4.43 x 6.28 x 5.74 cm (length) -No median lobe noted    Assessment/ Plan:  1. BPH with obstruction/lower urinary tract symptoms Currently managed on Flomax only  Cystoscopy and transrectal ultrasound today shows markedly enlarged prostate likely contributing to his urinary symptoms.  We briefly discussed options including optimization of medications including finasteride versus  consideration of outlet procedure.  He was given information on each of these things including HoLEP, TURP, and UroLift.  Plan for follow-up to discuss each of these options in more detail. - Urinalysis, Complete    Vanna Scotland, MD

## 2023-04-20 NOTE — Patient Instructions (Signed)
Finasteride Tablets (BPH) What is this medication? FINASTERIDE (fi NAS teer ide) treats the symptoms of an enlarged prostate (benign prostatic hyperplasia). It works by decreasing the size of the prostate. It belongs to a group of medications called 5-alpha reductase inhibitors. This medicine may be used for other purposes; ask your health care provider or pharmacist if you have questions. COMMON BRAND NAME(S): Proscar What should I tell my care team before I take this medication? They need to know if you have any of these conditions: Liver disease An unusual or allergic reaction to finasteride, other medications, foods, dyes, or preservatives Pregnant or trying to get pregnant Breast-feeding How should I use this medication? Take this medication by mouth with water. Take it as directed on the prescription label at the same time every day. You can take it with or without food. If it upsets your stomach, take it with food. Keep taking it unless your care team tells you to stop. Talk to your care team about the use of this medication in children. Special care may be needed. Overdosage: If you think you have taken too much of this medicine contact a poison control center or emergency room at once. NOTE: This medicine is only for you. Do not share this medicine with others. What if I miss a dose? If you miss a dose, take it as soon as you can. If it is almost time for your next dose, take only that dose. Do not take double or extra doses. What may interact with this medication? Saw palmetto or other dietary supplements This list may not describe all possible interactions. Give your health care provider a list of all the medicines, herbs, non-prescription drugs, or dietary supplements you use. Also tell them if you smoke, drink alcohol, or use illegal drugs. Some items may interact with your medicine. What should I watch for while using this medication? Visit your care team for regular checks on  your progress. It may be some time before you see the benefit from this medication. You may need blood work while taking this medication. For example, your care team may have you take a blood test called PSA for the screening of prostate cancer. Make sure your care team knows you are taking this medication before you take a PSA test. Talk to your care team about your risk of cancer. You may be more at risk for certain types of cancer if you take this medication. Do not donate blood while you are taking this medication. Donated blood may contain enough of this medication to cause birth defects in someone who is pregnant. Ask your care team when it is safe to donate blood after you stop taking this medication. This medication can cause serious birth defects. If you are pregnant or may get pregnant, do not handle broken or crushed tablets of this medication. If you are pregnant and come into contact with broken or crushed tablets, contact your care team. Exposure to whole tablets is not expected to cause harm as long as they are not swallowed. What side effects may I notice from receiving this medication? Side effects that you should report to your care team as soon as possible: Allergic reactions--skin rash, itching, hives, swelling of the face, lips, tongue, or throat Breast tissue changes, new lumps, redness, pain, or discharge from the nipple Side effects that usually do not require medical attention (report to your care team if they continue or are bothersome): Breast pain or tenderness Change in sex drive  or performance This list may not describe all possible side effects. Call your doctor for medical advice about side effects. You may report side effects to FDA at 1-800-FDA-1088. Where should I keep my medication? Keep out of the reach of children and pets. Store at room temperature below 30 degrees C (86 degrees F). Protect from light. Keep the container tightly closed. Get rid of any unused  medication after the expiration date. To get rid of medications that are no longer needed or have expired: Take the medication to a medication take-back program. Check with your pharmacy or law enforcement to find a location. If you cannot return the medication, ask your pharmacist or care team how to get rid of this medication safely. NOTE: This sheet is a summary. It may not cover all possible information. If you have questions about this medicine, talk to your doctor, pharmacist, or health care provider.  2024 Elsevier/Gold Standard (2022-04-26 00:00:00)   Holmium Laser Enucleation of the Prostate (HoLEP)  HoLEP is a treatment for men with benign prostatic hyperplasia (BPH). The laser surgery removed blockages of urine flow, and is done without any incisions on the body.     What is HoLEP?  HoLEP is a type of laser surgery used to treat obstruction (blockage) of urine flow as a result of benign prostatic hyperplasia (BPH). In men with BPH, the prostate gland is not cancerous, but has become enlarged. An enlarged prostate can result in a number of urinary tract symptoms such as weak urinary stream, difficulty in starting urination, inability to urinate, frequent urination, or getting up at night to urinate.  HoLEP was developed in the 1990's as a more effective and less expensive surgical option for BPH, compared to other surgical options such as laser vaporization(PVP/greenlight laser), transurethral resection of the prostate(TURP), and open simple prostatectomy.   What happens during a HoLEP?  HoLEP requires general anesthesia ("asleep" throughout the procedure).   An antibiotic is given to reduce the risk of infection  A surgical instrument called a resectoscope is inserted through the urethra (the tube that carries urine from the bladder). The resectoscope has a camera that allows the surgeon to view the internal structure of the prostate gland, and to see where the incisions are  being made during surgery.  The laser is inserted into the resectoscope and is used to enucleate (free up) the enlarged prostate tissue from the capsule (outer shell) and then to seal up any blood vessels. The tissue that has been removed is pushed back into the bladder.  A morcellator is placed through the resectoscope, and is used to suction out the prostate tissue that has been pushed into the bladder.  When the prostate tissue has been removed, the resectoscope is removed, and a foley catheter is placed to allow healing and drain the urine from the bladder.     What happens after a HoLEP?  More than 90% of patients go home the same day a few hours after surgery. Less than 10% will be admitted to the hospital overnight for observation to monitor the urine, or if they have other medical problems.  Fluid is flushed through the catheter for about 1 hour after surgery to clear any blood from the urine. It is normal to have some blood in the urine after surgery. The need for blood transfusion is extremely rare.  Eating and drinking are permitted after the procedure once the patient has fully awakened from anesthesia.  The catheter is usually removed 2-3  days after surgery- the patient will come to clinic to have the catheter removed and make sure they can urinate on their own.  It is very important to drink lots of fluids after surgery for one week to keep the bladder flushed.  At first, there may be some burning with urination, but this typically improved within a few hours to days. Most patients do not have a significant amount of pain, and narcotic pain medications are rarely needed.  Symptoms of urinary frequency, urgency, and even leakage are NORMAL for the first few weeks after surgery as the bladder adjusts after having to work hard against blockage from the prostate for many years. This will improve, but can sometimes take several months.  The use of pelvic floor exercises (Kegel  exercises) can help improve problems with urinary incontinence.   After catheter removal, patients will be seen at 6 weeks and 6 months for symptom check  No heavy lifting for at least 2-3 weeks after surgery, however patients can walk and do light activities the first day after surgery. Return to work time depends on occupation.    What are the advantages of HoLEP?  HoLEP has been studied in many different parts of the world and has been shown to be a safe and effective procedure. Although there are many types of BPH surgeries available, HoLEP offers a unique advantage in being able to remove a large amount of tissue without any incisions on the body, even in very large prostates, while decreasing the risk of bleeding and providing tissue for pathology (to look for cancer). This decreases the need for blood transfusions during surgery, minimizes hospital stay, and reduces the risk of needing repeat treatment.  What are the side effects of HoLEP?  Temporary burning and bleeding during urination. Some blood may be seen in the urine for weeks after surgery and is part of the healing process.  Urinary incontinence (inability to control urine flow) is expected in all patients immediately after surgery and they should wear pads for the first few days/weeks. This typically improves over the course of several weeks. Performing Kegel exercises can help decrease leakage from stress maneuvers such as coughing, sneezing, or lifting. The rate of long term leakage is very low. Patients may also have leakage with urgency and this may be treated with medication. The risk of urge incontinence can be dependent on several factors including age, prostate size, symptoms, and other medical problems.  Retrograde ejaculation or "backwards ejaculation." In 75% of cases, the patient will not see any fluid during ejaculation after surgery.  Erectile function is generally not significantly affected.   What are the risks  of HoLEP?  Injury to the urethra or development of scar tissue at a later date  Injury to the capsule of the prostate (typically treated with longer catheterization).  Injury to the bladder or ureteral orifices (where the urine from the kidney drains out)  Infection of the bladder, testes, or kidneys  Return of urinary obstruction at a later date requiring another operation (<2%)  Need for blood transfusion or re-operation due to bleeding  Failure to relieve all symptoms and/or need for prolonged catheterization after surgery  5-15% of patients are found to have previously undiagnosed prostate cancer in their specimen. Prostate cancer can be treated after HoLEP.  Standard risks of anesthesia including blood clots, heart attacks, etc  When should I call my doctor?  Fever over 101.3 degrees  Inability to urinate, or large blood clots in the  urine    Transurethral Resection of the Prostate Transurethral resection of the prostate (TURP) is the removal, or resection, of part of the prostate tissue. This procedure is done to treat an enlarged prostate gland (benign prostatic hyperplasia). The goal of TURP is to remove enough prostate tissue to allow for a normal flow of urine. The procedure will allow you to empty your bladder more completely when you urinate so that you can urinate less often. In a transurethral resection, a thin telescope with a light, a camera, and an electric cutting edge (resectoscope) is passed through the urethra and into the prostate. The opening of the urethra is at the end of the penis. Tell a health care provider about: Any allergies you have. All medicines you are taking, including vitamins, herbs, eye drops, creams, and over-the-counter medicines. Any problems you or family members have had with anesthetic medicines. Any bleeding problems you have. Any surgeries you have had. Any medical conditions you have. Any prostate infections you have had. What are  the risks? Generally, this is a safe procedure. However, problems may occur, including: Infection. Bleeding. Allergic reactions to medicines. Blood in the urine (hematuria). Damage to nearby structures or organs. Other problems may occur, but they are rare. They include: Dry ejaculation, or having no semen come out during orgasm. Erectile dysfunction, or being unable to have or keep an erection. Scarring that leads to narrowing of the urethra. This narrowing may block the flow of urine. Inability to control when you urinate (incontinence). Deep vein thrombosis. This is a blood clot that can develop in your leg. TURP syndrome. This can happen when you lose too much sodium during or after the procedure. Some signs and symptoms of this condition include: Weakness. Headaches. Nausea or vomiting. Muscle cramping. What happens before the procedure? When to stop eating and drinking Follow instructions from your health care provider about what you may eat and drink before your procedure. These may include: 8 hours before your procedure Stop eating most foods. Do not eat meat, fried foods, or fatty foods. Eat only light foods, such as toast or crackers. All liquids are okay except energy drinks and alcohol. 6 hours before your procedure Stop eating. Drink only clear liquids, such as water, clear fruit juice, black coffee, plain tea, and sports drinks. Do not drink energy drinks or alcohol. 2 hours before your procedure Stop drinking all liquids. You may be allowed to take medicines with small sips of water. If you do not follow your health care provider's instructions, your procedure may be delayed or canceled. Medicines Ask your health care provider about: Changing or stopping your regular medicines. This is especially important if you are taking diabetes medicines or blood thinners. Taking medicines such as aspirin and ibuprofen. These medicines can thin your blood. Do not take these  medicines unless your health care provider tells you to take them. Taking over-the-counter medicines, vitamins, herbs, and supplements. Surgery safety Ask your health care provider what steps will be taken to help prevent infection. These steps may include: Removing hair at the surgery site. Washing skin with a germ-killing soap. Taking antibiotic medicine. General instructions Do not use any products that contain nicotine or tobacco for at least 4 weeks before the procedure. These products include cigarettes, chewing tobacco, and vaping devices, such as e-cigarettes. If you need help quitting, ask your health care provider. If you will be going home right after the procedure, plan to have a responsible adult: Take you home from  the hospital or clinic. You will not be allowed to drive. Care for you for the time you are told. What happens during the procedure?  An IV will be inserted into one of your veins. You will be given one or more of the following: A medicine to help you relax (sedative). A medicine to make you fall asleep (general anesthetic). A medicine that is injected into your spine to numb the area below and slightly above the injection site (spinal anesthetic). Your legs will be placed in foot rests (stirrups) so that your legs are apart and your knees are bent. The resectoscope will be passed through your urethra to your prostate. Parts of your prostate will be resected using the cutting edge of the resectoscope. Fluid will be passed to rinse out the cut tissues (irrigation). The resectoscope will be removed. A small, thin tube (catheter) will be passed through your urethra and into your bladder. The catheter will drain urine into a bag outside of your body. The procedure may vary among health care providers and hospitals. What happens after the procedure? Your blood pressure, heart rate, breathing rate, and blood oxygen level will be monitored until you leave the hospital or  clinic. You will be given fluids through the IV. The IV will be removed when you start eating and drinking normally. You may have some pain. Pain medicine will be available to help you. You will have a catheter draining your urine. You may have blood in your urine. Your catheter may be kept in until your urine is clear. Your urinary drainage will be monitored. If necessary, your bladder may be rinsed out (irrigated) through your catheter. You will be encouraged to walk around as soon as possible. You may have to wear compression stockings. These stockings help to prevent blood clots and reduce swelling in your legs. If you were given a sedative during the procedure, it can affect you for several hours. Do not drive or operate machinery until your health care provider says that it is safe. Summary Transurethral resection of the prostate (TURP) is the removal (resection) of part of the prostate tissue. The goal of this procedure is to remove enough prostate tissue to allow for a normal flow of urine. Follow instructions from your health care provider about taking medicines and about eating and drinking before the procedure. This information is not intended to replace advice given to you by your health care provider. Make sure you discuss any questions you have with your health care provider. Document Revised: 07/14/2021 Document Reviewed: 07/14/2021 Elsevier Patient Education  2024 [employer].

## 2023-05-11 ENCOUNTER — Telehealth (INDEPENDENT_AMBULATORY_CARE_PROVIDER_SITE_OTHER): Payer: Medicare HMO | Admitting: Urology

## 2023-05-11 DIAGNOSIS — N401 Enlarged prostate with lower urinary tract symptoms: Secondary | ICD-10-CM

## 2023-05-11 DIAGNOSIS — N138 Other obstructive and reflux uropathy: Secondary | ICD-10-CM

## 2023-05-11 MED ORDER — FINASTERIDE 5 MG PO TABS: 5.0000 mg | ORAL_TABLET | Freq: Every day | ORAL | 4 refills | Status: DC

## 2023-05-11 NOTE — Progress Notes (Signed)
Virtual Visit via Video Note  I connected with Kevin Esparza on 05/11/2023 at  8:30 AM EDT by a video enabled telemedicine application and verified that I am speaking with the correct person using two identifiers.  Location: Patient: Home Provider: Home   I discussed the limitations of evaluation and management by telemedicine and the availability of in person appointments. The patient expressed understanding and agreed to proceed.  History of Present Illness:  62 year-old male with a personal history of BPH who presents today to discuss consideration of an outlet procedure. He recently underwent cystoscopy, which showed mild trabeculation, no discrete median lobe, and a prostate volume of 83.6 grams. He is managed on Flomax only at this point in time. We have discussed HoLEP, TURP, and UroLift. He was given information at the last visit. He returns today to further discuss these options.   Medical comorbidities do include a history of Afib on Eliquis. He has recently gotten clearance to hold with this with upcoming EGD.    Observations/Objective:  He appears well.   Assessment and Plan:  1. BPH - Discussed various management options, including UroLift, TURP, and HoLEP.   - Discussed how it may be difficult to get UroLift approved by insurance due to prostate size criteria of 80 grams. Also discussed that this is the least invasive of the 3 procedures with little to no sexual side effects. - Discussed details of the TURP procedure, including the risks and benefits such as the need for a catheter postoperatively, bleeding, infection, and sexual side effects. The procedure lasts about 10 years and has the potential to eliminate the need for medications.  - Discussed the HoLEP procedure in detail including the risks and benefits that include the need for a catheter for a few days postoperatively. This procedure lasts for about 15-20 years as opposed to 10 for the TURP. There is a risk of  dribbling and stress incontinence that typically resolves with time.  - He is reluctant to proceed with surgical options at this time - Plan to start finasteride. Prescription for finasteride sent for 90 days. - Discussed side effects of finasteride, including changes in libido and hair growth   Follow Up Instructions:   Follow up in 6 months with [facility] and [facility].  I discussed the assessment and treatment plan with the patient. The patient was provided an opportunity to ask questions and all were answered. The patient agreed with the plan and demonstrated an understanding of the instructions.   The patient was advised to call back or seek an in-person evaluation if the symptoms worsen or if the condition fails to improve as anticipated.  I provided 10 minutes of non-face-to-face time during this encounter.  Marcelle Overlie Plume,acting as a Neurosurgeon for Vanna Scotland, MD.,have documented all relevant documentation on the behalf of Vanna Scotland, MD,as directed by  Vanna Scotland, MD while in the presence of Vanna Scotland, MD.

## 2023-06-17 ENCOUNTER — Encounter: Payer: Self-pay | Admitting: Gastroenterology

## 2023-06-17 ENCOUNTER — Ambulatory Visit: Payer: Medicare HMO

## 2023-06-17 VITALS — Ht 70.0 in | Wt 172.0 lb

## 2023-06-17 DIAGNOSIS — C169 Malignant neoplasm of stomach, unspecified: Secondary | ICD-10-CM

## 2023-06-17 NOTE — Progress Notes (Signed)
 No egg or soy allergy known to patient  No issues known to pt with past sedation with any surgeries or procedures Patient denies ever being told they had issues or difficulty with intubation  No FH of Malignant Hyperthermia Pt is not on diet pills Pt is not on  home 02  Pt is not on blood thinners  Pt denies issues with constipation  No A fib or A flutter Have any cardiac testing pending--no Pt can ambulate independently Pt denies use of chewing tobacco Discussed diabetic I weight loss medication holds Discussed NSAID holds Checked BMI Pt instructed to use Singlecare.com or GoodRx for a price reduction on prep  Patient's chart reviewed by Kevin Esparza CNRA prior to previsit and patient appropriate for the [facility].  Pre visit completed and red dot placed by patient's name on their procedure day (on provider's schedule).

## 2023-06-23 ENCOUNTER — Telehealth: Payer: Self-pay | Admitting: Cardiology

## 2023-06-23 NOTE — Telephone Encounter (Signed)
Patient came by office to drop off financial assistance application for Eliquis. Placed in nurse box.

## 2023-06-27 NOTE — Telephone Encounter (Signed)
Forms received. Filled out & signed by provider.  Faxed to [employer]

## 2023-07-05 ENCOUNTER — Ambulatory Visit: Payer: Medicare HMO | Attending: Cardiology | Admitting: Nurse Practitioner

## 2023-07-05 DIAGNOSIS — Z01810 Encounter for preprocedural cardiovascular examination: Secondary | ICD-10-CM

## 2023-07-05 NOTE — Progress Notes (Signed)
Virtual Visit via Telephone Note   Because of Huntley Petitfrere's co-morbid illnesses, he is at least at moderate risk for complications without adequate follow up.  This format is felt to be most appropriate for this patient at this time.  The patient did not have access to video technology/had technical difficulties with video requiring transitioning to audio format only (telephone).  All issues noted in this document were discussed and addressed.  No physical exam could be performed with this format.  Please refer to the patient's chart for his consent to telehealth for [facility].  Evaluation Performed:  Preoperative cardiovascular risk assessment _____________   Date:  07/05/2023   Patient ID:  Carita Pian, DOB 12/08/1960, MRN [id number] Patient Location:  Home Provider location:   Office  Primary Care Provider:  Shane Crutch, [place] Primary Cardiologist:  Debbe Odea, MD  Chief Complaint / Patient Profile   62 y.o. y/o male with a h/o paroxysmal atrial fibrillation, hypertension, hyperlipidemia, and type 2 diabetes who is pending endoscopy/colonoscopy on 07/19/2023 with [facility] and presents today for telephonic preoperative cardiovascular risk assessment.  History of Present Illness    Teegan Desocio is a 62 y.o. male who presents via audio/video conferencing for a telehealth visit today.  Pt was last seen in cardiology clinic on 01/31/2023 by Dr. Azucena Cecil.  At that time Geof Pimenta was doing well.  The patient is now pending procedure as outlined above. Since his last visit, he has done well from a cardiac standpoint.   He denies chest pain, palpitations, dyspnea, pnd, orthopnea, n, v, dizziness, syncope, edema, weight gain, or early satiety. All other systems reviewed and are otherwise negative except as noted above.   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   Pneumonia    several times   Stomach cancer (HCC)    Stroke [facility])    Past Surgical History:  Procedure Laterality Date   APPENDECTOMY  2005   BIOPSY  03/19/2021   Procedure: BIOPSY;  Surgeon: Lemar Lofty., MD;  Location: [facility] ENDOSCOPY;  Service: Gastroenterology;;   BIOPSY  05/13/2022   Procedure: BIOPSY;  Surgeon: Lemar Lofty., MD;  Location: [facility] ENDOSCOPY;  Service: Gastroenterology;;   CERVICAL SPINE SURGERY  1997   c4-c7   CHOLECYSTECTOMY  2005   COLONOSCOPY WITH PROPOFOL N/A 12/31/2020   Procedure: COLONOSCOPY WITH PROPOFOL;  Surgeon: Pasty Spillers, MD;  Location: [facility] ENDOSCOPY;  Service: Endoscopy;  Laterality: N/A;   COLONOSCOPY WITH PROPOFOL N/A 03/19/2021   Procedure: COLONOSCOPY WITH PROPOFOL;  Surgeon: Meridee Score Netty Starring., MD;  Location: [facility] ENDOSCOPY;  Service: Gastroenterology;  Laterality: N/A;   COLONOSCOPY WITH PROPOFOL N/A 05/13/2022   Procedure: COLONOSCOPY WITH PROPOFOL;  Surgeon: Meridee Score Netty Starring., MD;  Location[facility] ENDOSCOPY;  Service: Gastroenterology;  Laterality: N/A;   ENDOSCOPIC MUCOSAL RESECTION  03/19/2021   Procedure: ENDOSCOPIC MUCOSAL RESECTION;  Surgeon: Meridee Score Netty Starring., MD;  Location: [facility] ENDOSCOPY;  Service: Gastroenterology;;   ENDOSCOPIC MUCOSAL RESECTION N/A 05/13/2022  Procedure: ENDOSCOPIC MUCOSAL RESECTION;  Surgeon: Meridee Score Netty Starring., MD;  Location: [facility] ENDOSCOPY;  Service: Gastroenterology;  Laterality: N/A;   ESOPHAGOGASTRODUODENOSCOPY (EGD) WITH PROPOFOL N/A 12/31/2020   Procedure: ESOPHAGOGASTRODUODENOSCOPY (EGD) WITH PROPOFOL;  Surgeon: Pasty Spillers, MD;  Location: [facility] ENDOSCOPY;   Service: Endoscopy;  Laterality: N/A;   ESOPHAGOGASTRODUODENOSCOPY (EGD) WITH PROPOFOL N/A 03/19/2021   Procedure: ESOPHAGOGASTRODUODENOSCOPY (EGD) WITH PROPOFOL;  Surgeon: Meridee Score Netty Starring., MD;  Location: [facility] ENDOSCOPY;  Service: Gastroenterology;  Laterality: N/A;   ESOPHAGOGASTRODUODENOSCOPY (EGD) WITH PROPOFOL N/A 05/13/2022   Procedure: ESOPHAGOGASTRODUODENOSCOPY (EGD) WITH PROPOFOL;  Surgeon: Meridee Score Netty Starring., MD;  Location: [facility] ENDOSCOPY;  Service: Gastroenterology;  Laterality: N/A;   HEMOSTASIS CLIP PLACEMENT  03/19/2021   Procedure: HEMOSTASIS CLIP PLACEMENT;  Surgeon: Lemar Lofty., MD;  Location: [facility] ENDOSCOPY;  Service: Gastroenterology;;   HEMOSTASIS CLIP PLACEMENT  05/13/2022   Procedure: HEMOSTASIS CLIP PLACEMENT;  Surgeon: Lemar Lofty., MD;  Location: [facility] ENDOSCOPY;  Service: Gastroenterology;;   MULTIPLE TOOTH EXTRACTIONS     dentures   POLYPECTOMY  03/19/2021   Procedure: POLYPECTOMY;  Surgeon: Lemar Lofty., MD;  Location: [facility] ENDOSCOPY;  Service: Gastroenterology;;   POLYPECTOMY  05/13/2022   Procedure: POLYPECTOMY;  Surgeon: Lemar Lofty., MD;  Location: [facility] ENDOSCOPY;  Service: Gastroenterology;;  EGD and colon   SUBMUCOSAL LIFTING INJECTION  03/19/2021   Procedure: SUBMUCOSAL LIFTING INJECTION;  Surgeon: Lemar Lofty., MD;  Location: [facility] ENDOSCOPY;  Service: Gastroenterology;;   SUBMUCOSAL LIFTING INJECTION  05/13/2022   Procedure: SUBMUCOSAL LIFTING INJECTION;  Surgeon: Lemar Lofty., MD;  Location: [facility] ENDOSCOPY;  Service: Gastroenterology;;   SUBMUCOSAL TATTOO INJECTION  05/13/2022   Procedure: SUBMUCOSAL TATTOO INJECTION;  Surgeon: Lemar Lofty., MD;  Location: [facility] ENDOSCOPY;  Service: Gastroenterology;;   TONSILLECTOMY     UPPER ESOPHAGEAL ENDOSCOPIC ULTRASOUND (EUS) N/A 03/19/2021   Procedure: UPPER ESOPHAGEAL ENDOSCOPIC ULTRASOUND (EUS);  Surgeon: Lemar Lofty., MD;  Location: [facility]  ENDOSCOPY;  Service: Gastroenterology;  Laterality: N/A;    Allergies  No Known Allergies  Home Medications    Prior to Admission medications   Medication Sig Start Date End Date Taking? Authorizing Provider  acetaminophen (TYLENOL) 650 MG CR tablet Take 1,300 mg by mouth See admin instructions. Take 1300 mg in the morning, may take a second 1300 mg dose during the day as needed for pain    [provider]  albuterol (VENTOLIN HFA) 108 (90 Base) MCG/ACT inhaler Inhale 2 puffs into the lungs every 6 (six) hours as needed for wheezing or shortness of breath.    [provider]  amLODipine (NORVASC) 5 MG tablet Take 1 tablet (5 mg total) by mouth daily. 07/29/22   Sunnie Nielsen, DO  apixaban (ELIQUIS) 5 MG TABS tablet Take 1 tablet (5 mg total) by mouth 2 (two) times daily. 03/21/23   Debbe Odea, MD  atorvastatin (LIPITOR) 40 MG tablet Take 1 tablet (40 mg total) by mouth daily. 07/29/22   Sunnie Nielsen, DO  colchicine 0.6 MG tablet Take 0.6 mg by mouth 2 (two) times daily as needed (gout).    [provider]  cyclobenzaprine (FLEXERIL) 10 MG tablet Take 10 mg by mouth 3 (three) times daily as needed for muscle spasms.    [provider]  enalapril (VASOTEC) 10 MG tablet Take 1 tablet (10 mg total) by mouth daily. 07/29/22   Sunnie Nielsen, DO  finasteride (PROSCAR) 5 MG tablet Take 1 tablet (5 mg total) by mouth daily. 05/11/23   Apolinar Junes,  Morrie Sheldon, MD  gabapentin (NEURONTIN) 300 MG capsule Take 300 mg p.o. every morning, take 600 mg p.o. nightly 07/28/22   Sunnie Nielsen, DO  meclizine (ANTIVERT) 25 MG tablet Take 25 mg by mouth every 6 (six) hours as needed. 02/07/23   [provider]  metFORMIN (GLUCOPHAGE) 1000 MG tablet Take 1,000 mg by mouth daily with breakfast.    [provider]  metoprolol succinate (TOPROL XL) 25 MG 24 hr tablet Take 1 tablet (25 mg total) by mouth every evening. 01/26/23   Lanier Prude, MD   omeprazole (PRILOSEC OTC) 20 MG tablet Take 20 mg by mouth daily.    [provider]  omeprazole (PRILOSEC) 20 MG capsule Take 20 mg by mouth 2 (two) times daily. 04/21/23   [provider]  ondansetron (ZOFRAN) 8 MG tablet Take 8 mg by mouth 3 (three) times daily. 01/26/23   [provider]  tamsulosin (FLOMAX) 0.4 MG CAPS capsule Take 1 capsule (0.4 mg total) by mouth daily. 03/16/22   Michiel Cowboy A, PA-C  tiZANidine (ZANAFLEX) 4 MG tablet Takes 1/2 tablet in the daytime and takes 1 tablet at bedtime daily 03/14/23   [provider]    Physical Exam    Vital Signs:  Rigoverto Stonebarger does not have vital signs available for review today.  Given telephonic nature of communication, physical exam is limited. AAOx3. NAD. Normal affect.  Speech and respirations are unlabored.  Accessory Clinical Findings    None  Assessment & Plan    1.  Preoperative Cardiovascular Risk Assessment:  According to the Revised Cardiac Risk Index (RCRI), his Perioperative Risk of Major Cardiac Event is (%): 0.4. His Functional Capacity in METs is: 6.27 according to the Duke Activity Status Index (DASI). Therefore, based on ACC/AHA guidelines, patient would be at acceptable risk for the planned procedure without further cardiovascular testing.   The patient was advised that if he develops new symptoms prior to surgery to contact our office to arrange for a follow-up visit, and he verbalized understanding.  Per office protocol, patient can hold Eliquis for 2 days prior to procedure.   Patient will not need bridging with Lovenox (enoxaparin) around procedure. Please resume Eliquis as soon as possible postprocedure, at the discretion of the surgeon.    A copy of this note will be routed to requesting surgeon.  Time:   Today, I have spent 5 minutes with the patient with telehealth technology discussing medical history, symptoms, and management plan.     Joylene Grapes,  NP  07/05/2023, 2:00 PM

## 2023-07-07 ENCOUNTER — Telehealth: Payer: Self-pay | Admitting: *Deleted

## 2023-07-07 MED ORDER — APIXABAN 5 MG PO TABS: 5.0000 mg | ORAL_TABLET | Freq: Two times a day (BID) | ORAL | 11 refills | Status: DC

## 2023-07-07 NOTE — Telephone Encounter (Signed)
Left voicemail message to call back.   Called patient to review he needs to spend $671.33 and based on his income with current expenses spent he will need to spend another $368.80 in order to qualify.

## 2023-07-07 NOTE — Telephone Encounter (Signed)
Patient wants a call back from RN Pam to discuss further question.

## 2023-07-07 NOTE — Telephone Encounter (Signed)
Spoke with patient and reviewed recommendations to qualify for assistance. Discussed that he needs to spend additional $368.80 and then he would qualify for free eliquis. Advised that once he has spent that additional amount to get updated prescription report and I will fax it back to them. He verbalized understanding with no further questions at this time.

## 2023-07-07 NOTE — Addendum Note (Signed)
Addended by: Bryna Colander on: 07/07/2023 09:36 AM   Modules accepted: Orders

## 2023-07-07 NOTE — Telephone Encounter (Signed)
Returned call to patient and sent in prescription per his request to [facility]. Refill was sent in and he was appreciative for the assistance.

## 2023-07-15 MED ORDER — PEG 3350-KCL-NA BICARB-NACL 420 G PO SOLR: 4000.0000 mL | Freq: Once | ORAL | 0 refills | Status: AC

## 2023-07-15 NOTE — Progress Notes (Signed)
[facility] outpatient RN sent a message to me requesting to have additional prep instructions sent to the patient as his colonoscopy prep instructions were not given at [facility] appt; Prep and prep instructions entered into EPIC; called and spoke with pt=patient verbally given instructions pertaining to prep and what to do/not to do as well as written prep instructions sent to the patient via MyChart- he stated he would go straight home and look over the instructions and go to the pharmacy and pick up the prep as well;  all questions answered for patient and nurse advised patient to call or send a MyChart message if any need should arise;

## 2023-07-15 NOTE — Progress Notes (Signed)
Attempted to obtain medical history via telephone, unable to reach at this time. HIPAA compliant voicemail message left requesting return call to pre surgical testing department.  

## 2023-07-15 NOTE — Addendum Note (Signed)
Addended by: Johnney Killian on: 07/15/2023 05:07 PM   Modules accepted: Orders

## 2023-07-18 ENCOUNTER — Telehealth: Payer: Self-pay | Admitting: Gastroenterology

## 2023-07-18 NOTE — Telephone Encounter (Signed)
Hospital called to cancel the EGD appt scheduled for tomorrow with Dr. Meridee Score.

## 2023-07-18 NOTE — Telephone Encounter (Signed)
Inbound call from patient requesting to cancel procedure due to him having flu like symptoms since yesterday. Please advise.   Thank you

## 2023-07-19 ENCOUNTER — Ambulatory Visit (HOSPITAL_COMMUNITY): Admission: RE | Admit: 2023-07-19 | Payer: Medicare HMO | Source: Home / Self Care | Admitting: Gastroenterology

## 2023-07-19 SURGERY — EGD (ESOPHAGOGASTRODUODENOSCOPY)
Anesthesia: Monitor Anesthesia Care

## 2023-07-29 ENCOUNTER — Encounter: Payer: Self-pay | Admitting: Cardiology

## 2023-07-29 ENCOUNTER — Ambulatory Visit: Payer: Medicare HMO | Attending: Cardiology | Admitting: Cardiology

## 2023-07-29 VITALS — BP 110/62 | HR 56 | Ht 70.0 in | Wt 168.8 lb

## 2023-07-29 DIAGNOSIS — I10 Essential (primary) hypertension: Secondary | ICD-10-CM | POA: Diagnosis not present

## 2023-07-29 DIAGNOSIS — R0683 Snoring: Secondary | ICD-10-CM

## 2023-07-29 DIAGNOSIS — E782 Mixed hyperlipidemia: Secondary | ICD-10-CM

## 2023-07-29 DIAGNOSIS — I480 Paroxysmal atrial fibrillation: Secondary | ICD-10-CM | POA: Diagnosis not present

## 2023-07-29 NOTE — Patient Instructions (Signed)
Medication Instructions:   Your physician recommends that you continue on your current medications as directed. Please refer to the Current Medication list given to you today.  *If you need a refill on your cardiac medications before your next appointment, please call your pharmacy*   Lab Work:  Your physician recommends you have labs  - LIPID   If you have labs (blood work) drawn today and your tests are completely normal, you will receive your results only by: MyChart Message (if you have MyChart) OR A paper copy in the mail If you have any lab test that is abnormal or we need to change your treatment, we will call you to review the results.   Testing/Procedures:  None Ordered   Follow-Up: At [facility], you and your health needs are our priority.  As part of our continuing mission to provide you with exceptional heart care, we have created designated Provider Care Teams.  These Care Teams include your primary Cardiologist (physician) and Advanced Practice Providers (APPs -  Physician Assistants and Nurse Practitioners) who all work together to provide you with the care you need, when you need it.  We recommend signing up for the patient portal called "MyChart".  Sign up information is provided on this After Visit Summary.  MyChart is used to connect with patients for Virtual Visits (Telemedicine).  Patients are able to view lab/test results, encounter notes, upcoming appointments, etc.  Non-urgent messages can be sent to your provider as well.   To learn more about what you can do with MyChart, go to [email or web address].    Your next appointment:   12 month(s)  Provider:   You may see Debbe Odea, MD or one of the following Advanced Practice Providers on your designated Care Team:   Nicolasa Ducking, NP Eula Listen, PA-C Cadence Fransico Michael, PA-C Charlsie Quest, NP

## 2023-07-29 NOTE — Progress Notes (Signed)
Cardiology Office Note:    Date:  07/29/2023   ID:  Kevin Esparza, DOB January 07, 1961, MRN [id number]  PCP:  Shane Crutch, PA   [facility] Providers Cardiologist:  Debbe Odea, MD Electrophysiologist:  Lanier Prude, MD     Referring MD: Shane Crutch, [place]   Chief Complaint  Patient presents with   Follow-up    Patient denies new or acute cardiac problems/concerns today.      History of Present Illness:    Kevin Esparza is a 62 y.o. male with a hx of paroxysmal atrial fibrillation, hypertension, diabetes, GERD, former smoker x 20 years presenting for follow-up.  Patient states feeling well, has rare palpitations, no dizziness, presyncope or syncope.  Tolerating Eliquis with no bleeding issues.  States snores occasionally, does not sleep much, has daytime fatigue.  Overall he feels well, no concerns at this time.  Had lipid panel obtained at PCPs office.  Was told everything was okay.  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   Pneumonia    several times   Stomach cancer (HCC)    Stroke [facility])     Past Surgical History:  Procedure Laterality Date   APPENDECTOMY  2005   BIOPSY  03/19/2021   Procedure: BIOPSY;  Surgeon: Lemar Lofty., MD;  Location: [facility] ENDOSCOPY;  Service: Gastroenterology;;   BIOPSY  05/13/2022   Procedure: BIOPSY;  Surgeon: Lemar Lofty., MD;  Location: [facility]  ENDOSCOPY;  Service: Gastroenterology;;   CERVICAL SPINE SURGERY  1997   c4-c7   CHOLECYSTECTOMY  2005   COLONOSCOPY WITH PROPOFOL N/A 12/31/2020   Procedure: COLONOSCOPY WITH PROPOFOL;  Surgeon: Pasty Spillers, MD;  Location: [facility] ENDOSCOPY;  Service: Endoscopy;  Laterality: N/A;   COLONOSCOPY WITH PROPOFOL N/A 03/19/2021   Procedure: COLONOSCOPY WITH PROPOFOL;  Surgeon: Meridee Score Netty Starring., MD;  Location: [facility] ENDOSCOPY;  Service: Gastroenterology;  Laterality: N/A;   COLONOSCOPY WITH PROPOFOL N/A 05/13/2022   Procedure: COLONOSCOPY WITH PROPOFOL;  Surgeon: Meridee Score Netty Starring., MD;  Location: [facility] ENDOSCOPY;  Service: Gastroenterology;  Laterality: N/A;   ENDOSCOPIC MUCOSAL RESECTION  03/19/2021   Procedure: ENDOSCOPIC MUCOSAL RESECTION;  Surgeon: Meridee Score Netty Starring., MD;  Location: [facility] ENDOSCOPY;  Service: Gastroenterology;;   ENDOSCOPIC MUCOSAL RESECTION N/A 05/13/2022   Procedure: ENDOSCOPIC MUCOSAL RESECTION;  Surgeon: Lemar Lofty., MD;  Location: [facility] ENDOSCOPY;  Service: Gastroenterology;  Laterality: N/A;   ESOPHAGOGASTRODUODENOSCOPY (EGD) WITH PROPOFOL N/A 12/31/2020   Procedure: ESOPHAGOGASTRODUODENOSCOPY (EGD) WITH PROPOFOL;  Surgeon: Pasty Spillers, MD;  Location: [facility] ENDOSCOPY;  Service: Endoscopy;  Laterality: N/A;   ESOPHAGOGASTRODUODENOSCOPY (EGD) WITH PROPOFOL N/A 03/19/2021   Procedure: ESOPHAGOGASTRODUODENOSCOPY (EGD) WITH PROPOFOL;  Surgeon: Meridee Score Netty Starring., MD;  Location: [facility] ENDOSCOPY;  Service: Gastroenterology;  Laterality: N/A;   ESOPHAGOGASTRODUODENOSCOPY (EGD) WITH PROPOFOL N/A 05/13/2022   Procedure: ESOPHAGOGASTRODUODENOSCOPY (EGD) WITH PROPOFOL;  Surgeon:  Mansouraty, Netty Starring., MD;  Location: [facility] ENDOSCOPY;  Service: Gastroenterology;  Laterality: N/A;   HEMOSTASIS CLIP PLACEMENT  03/19/2021   Procedure: HEMOSTASIS CLIP PLACEMENT;  Surgeon: Lemar Lofty., MD;  Location: [facility] ENDOSCOPY;  Service: Gastroenterology;;   HEMOSTASIS  CLIP PLACEMENT  05/13/2022   Procedure: HEMOSTASIS CLIP PLACEMENT;  Surgeon: Lemar Lofty., MD;  Location: [facility] ENDOSCOPY;  Service: Gastroenterology;;   MULTIPLE TOOTH EXTRACTIONS     dentures   POLYPECTOMY  03/19/2021   Procedure: POLYPECTOMY;  Surgeon: Lemar Lofty., MD;  Location: [facility] ENDOSCOPY;  Service: Gastroenterology;;   POLYPECTOMY  05/13/2022   Procedure: POLYPECTOMY;  Surgeon: Lemar Lofty., MD;  Location: [facility] ENDOSCOPY;  Service: Gastroenterology;;  EGD and colon   SUBMUCOSAL LIFTING INJECTION  03/19/2021   Procedure: SUBMUCOSAL LIFTING INJECTION;  Surgeon: Lemar Lofty., MD;  Location: [facility] ENDOSCOPY;  Service: Gastroenterology;;   SUBMUCOSAL LIFTING INJECTION  05/13/2022   Procedure: SUBMUCOSAL LIFTING INJECTION;  Surgeon: Lemar Lofty., MD;  Location: [facility] ENDOSCOPY;  Service: Gastroenterology;;   SUBMUCOSAL TATTOO INJECTION  05/13/2022   Procedure: SUBMUCOSAL TATTOO INJECTION;  Surgeon: Lemar Lofty., MD;  Location: [facility] ENDOSCOPY;  Service: Gastroenterology;;   TONSILLECTOMY     UPPER ESOPHAGEAL ENDOSCOPIC ULTRASOUND (EUS) N/A 03/19/2021   Procedure: UPPER ESOPHAGEAL ENDOSCOPIC ULTRASOUND (EUS);  Surgeon: Lemar Lofty., MD;  Location: [facility] ENDOSCOPY;  Service: Gastroenterology;  Laterality: N/A;    Current Medications: Current Meds  Medication Sig   acetaminophen (TYLENOL) 650 MG CR tablet Take 1,300 mg by mouth See admin instructions. Take 1300 mg in the morning, may take a second 1300 mg dose during the day as needed for pain   albuterol (VENTOLIN HFA) 108 (90 Base) MCG/ACT inhaler Inhale 2 puffs into the lungs every 6 (six) hours as needed for wheezing or shortness of breath.   amLODipine (NORVASC) 5 MG tablet Take 1 tablet (5 mg total) by mouth daily.   apixaban (ELIQUIS) 5 MG TABS tablet Take 1 tablet (5 mg total) by mouth 2 (two) times daily.   atorvastatin (LIPITOR) 40 MG tablet Take 1 tablet (40 mg total) by mouth  daily.   colchicine 0.6 MG tablet Take 0.6 mg by mouth 2 (two) times daily as needed (gout).   cyclobenzaprine (FLEXERIL) 10 MG tablet Take 10 mg by mouth 3 (three) times daily as needed for muscle spasms.   enalapril (VASOTEC) 10 MG tablet Take 1 tablet (10 mg total) by mouth daily.   finasteride (PROSCAR) 5 MG tablet Take 1 tablet (5 mg total) by mouth daily.   gabapentin (NEURONTIN) 300 MG capsule Take 300 mg p.o. every morning, take 600 mg p.o. nightly   meclizine (ANTIVERT) 25 MG tablet Take 25 mg by mouth every 6 (six) hours as needed.   metFORMIN (GLUCOPHAGE) 1000 MG tablet Take 1,000 mg by mouth daily with breakfast.   metoprolol succinate (TOPROL XL) 25 MG 24 hr tablet Take 1 tablet (25 mg total) by mouth every evening.   omeprazole (PRILOSEC OTC) 20 MG tablet Take 20 mg by mouth daily.   omeprazole (PRILOSEC) 20 MG capsule Take 20 mg by mouth 2 (two) times daily.   ondansetron (ZOFRAN) 8 MG tablet Take 8 mg by mouth 3 (three) times daily.   tamsulosin (FLOMAX) 0.4 MG CAPS capsule Take 1 capsule (0.4 mg total) by mouth daily.   tiZANidine (ZANAFLEX) 4 MG tablet Takes 1/2 tablet in the daytime and takes 1 tablet at bedtime daily     Allergies:  Patient has no known allergies.   Social History   Socioeconomic History   Marital status: Married    Spouse name: Not on file   Number of children: 3   Years of education: Not on file   Highest education level: Not on file  Occupational History   Occupation: disabled  Tobacco Use   Smoking status: Former    Current packs/day: 0.00    Types: Cigarettes    Start date: 03/05/1990    Quit date: 03/05/2004    Years since quitting: 19.4   Smokeless tobacco: Current    Types: Chew  Vaping Use   Vaping status: Never Used  Substance and Sexual Activity   Alcohol use: Not Currently    Alcohol/week: 1.0 standard drink of alcohol    Types: 1 Cans of beer per week   Drug use: Yes    Types: Marijuana    Comment: occ use   Sexual activity:  Yes  Other Topics Concern   Not on file  Social History Narrative   Not on file   Social Determinants of Health   Financial Resource Strain: Not on file  Food Insecurity: No Food Insecurity (07/27/2022)   Hunger Vital Sign    Worried About Running Out of Food in the Last Year: Never true    Ran Out of Food in the Last Year: Never true  Transportation Needs: No Transportation Needs (07/27/2022)   PRAPARE - Administrator, Civil Service (Medical): No    Lack of Transportation (Non-Medical): No  Physical Activity: Not on file  Stress: Not on file  Social Connections: Not on file     Family History: The patient's family history includes Cancer in his father; Diabetes in his mother; Hypertension in his mother; Kidney cancer in his maternal grandmother; Liver cancer in his maternal grandmother; Lung cancer in his maternal grandfather; Osteoarthritis in his mother; Osteoporosis in his mother; Stomach cancer in his maternal grandmother; Stroke in his father. There is no history of Kidney disease, Prostate cancer, Bladder Cancer, Colon cancer, Esophageal cancer, Inflammatory bowel disease, Pancreatic cancer, Rectal cancer, or Colon polyps.  ROS:   Please see the history of present illness.     All other systems reviewed and are negative.  EKGs/Labs/Other Studies Reviewed:    The following studies were reviewed today:  EKG Interpretation Date/Time:  Friday July 29 2023 11:33:01 EDT Ventricular Rate:  56 PR Interval:  132 QRS Duration:  86 QT Interval:  416 QTC Calculation: 401 R Axis:   52  Text Interpretation: Sinus bradycardia Confirmed by Debbe Odea ([id number]) on 07/29/2023 11:36:12 AM    Recent Labs: No results found for requested labs within last 365 days.  Recent Lipid Panel    Component Value Date/Time   CHOL 171 07/27/2022 0506   TRIG 221 (H) 07/27/2022 0506   HDL 26 (L) 07/27/2022 0506   CHOLHDL 6.6 07/27/2022 0506   VLDL 44 (H) 07/27/2022 0506    LDLCALC 101 (H) 07/27/2022 0506     Risk Assessment/Calculations:        Physical Exam:    VS:  BP 110/62 (BP Location: Left Arm, Patient Position: Sitting, Cuff Size: Normal)   Pulse (!) 56   Ht 5\' 10"  (1.778 m)   Wt 168 lb 12.8 oz (76.6 kg)   SpO2 98%   BMI 24.22 kg/m     Wt Readings from Last 3 Encounters:  07/29/23 168 lb 12.8 oz (76.6 kg)  06/17/23 172 lb (78  kg)  04/20/23 171 lb 4 oz (77.7 kg)     GEN:  Well nourished, well developed in no acute distress HEENT: Normal NECK: No JVD; No carotid bruits CARDIAC: RRR, no murmurs, rubs, gallops RESPIRATORY:  Clear to auscultation without rales, wheezing or rhonchi  ABDOMEN: Soft, non-tender, non-distended MUSCULOSKELETAL:  No edema; No deformity  SKIN: Warm and dry NEUROLOGIC:  Alert and oriented x 3 PSYCHIATRIC:  Normal affect   ASSESSMENT:    1. Paroxysmal atrial fibrillation (HCC)   2. Primary hypertension   3. Mixed hyperlipidemia   4. Snoring    PLAN:    In order of problems listed above:  Paroxysmal atrial fibrillation.  Denies palpitations.  Echo 9/23 EF 60 to 65% continue Toprol-XL 25 mg daily, Eliquis 5 mg twice daily.   Hypertension, BP controlled.  Continue Toprol-XL 25 mg daily, Vasotec 10 mg daily, Norvasc 5 mg daily. Hyperlipidemia, continue Lipitor 40 mg Snoring, daytime fatigue.  Sleep study recommended, patient declined.  Follow-up in 12 months.     Medication Adjustments/Labs and Tests Ordered: Current medicines are reviewed at length with the patient today.  Concerns regarding medicines are outlined above.  Orders Placed This Encounter  Procedures   Lipid panel   EKG 12-Lead   No orders of the defined types were placed in this encounter.   Patient Instructions  Medication Instructions:   Your physician recommends that you continue on your current medications as directed. Please refer to the Current Medication list given to you today.  *If you need a refill on your cardiac  medications before your next appointment, please call your pharmacy*   Lab Work:  Your physician recommends you have labs  - LIPID   If you have labs (blood work) drawn today and your tests are completely normal, you will receive your results only by: MyChart Message (if you have MyChart) OR A paper copy in the mail If you have any lab test that is abnormal or we need to change your treatment, we will call you to review the results.   Testing/Procedures:  None Ordered   Follow-Up: At [facility], you and your health needs are our priority.  As part of our continuing mission to provide you with exceptional heart care, we have created designated Provider Care Teams.  These Care Teams include your primary Cardiologist (physician) and Advanced Practice Providers (APPs -  Physician Assistants and Nurse Practitioners) who all work together to provide you with the care you need, when you need it.  We recommend signing up for the patient portal called "MyChart".  Sign up information is provided on this After Visit Summary.  MyChart is used to connect with patients for Virtual Visits (Telemedicine).  Patients are able to view lab/test results, encounter notes, upcoming appointments, etc.  Non-urgent messages can be sent to your provider as well.   To learn more about what you can do with MyChart, go to [email or web address].    Your next appointment:   12 month(s)  Provider:   You may see Debbe Odea, MD or one of the following Advanced Practice Providers on your designated Care Team:   Nicolasa Ducking, NP Eula Listen, PA-C Cadence Fransico Michael, PA-C Charlsie Quest, NP    Signed, Debbe Odea, MD  07/29/2023 12:24 PM    [facility]

## 2023-09-09 ENCOUNTER — Encounter: Payer: Medicare HMO | Admitting: Gastroenterology

## 2023-09-17 ENCOUNTER — Emergency Department: Payer: Medicare HMO

## 2023-09-17 ENCOUNTER — Emergency Department
Admission: EM | Admit: 2023-09-17 | Discharge: 2023-09-17 | Disposition: A | Discharge status: Home / Self Care | Payer: Medicare HMO | Attending: Emergency Medicine | Admitting: Emergency Medicine

## 2023-09-17 ENCOUNTER — Other Ambulatory Visit: Payer: Self-pay

## 2023-09-17 DIAGNOSIS — Z8673 Personal history of transient ischemic attack (TIA), and cerebral infarction without residual deficits: Secondary | ICD-10-CM | POA: Insufficient documentation

## 2023-09-17 DIAGNOSIS — I110 Hypertensive heart disease with heart failure: Secondary | ICD-10-CM | POA: Diagnosis not present

## 2023-09-17 DIAGNOSIS — E119 Type 2 diabetes mellitus without complications: Secondary | ICD-10-CM | POA: Insufficient documentation

## 2023-09-17 DIAGNOSIS — Z85028 Personal history of other malignant neoplasm of stomach: Secondary | ICD-10-CM | POA: Insufficient documentation

## 2023-09-17 DIAGNOSIS — I509 Heart failure, unspecified: Secondary | ICD-10-CM | POA: Insufficient documentation

## 2023-09-17 DIAGNOSIS — K2900 Acute gastritis without bleeding: Secondary | ICD-10-CM | POA: Diagnosis not present

## 2023-09-17 DIAGNOSIS — R112 Nausea with vomiting, unspecified: Secondary | ICD-10-CM | POA: Diagnosis present

## 2023-09-17 LAB — COMPREHENSIVE METABOLIC PANEL
ALT: 14 U/L (ref 0–44)
AST: 27 U/L (ref 15–41)
Albumin: 3.6 g/dL (ref 3.5–5.0)
Alkaline Phosphatase: 59 U/L (ref 38–126)
Anion gap: 10 (ref 5–15)
BUN: <5 mg/dL — ABNORMAL LOW (ref 8–23)
CO2: 24 mmol/L (ref 22–32)
Calcium: 8.7 mg/dL — ABNORMAL LOW (ref 8.9–10.3)
Chloride: 100 mmol/L (ref 98–111)
Creatinine, Ser: 1 mg/dL (ref 0.61–1.24)
GFR, Estimated: >60 mL/min (ref >60)
Glucose, Bld: 171 mg/dL — ABNORMAL HIGH (ref 70–99)
Potassium: 2.6 mmol/L — CL (ref 3.5–5.1)
Sodium: 134 mmol/L — ABNORMAL LOW (ref 135–145)
Total Bilirubin: 0.9 mg/dL (ref <1.2)
Total Protein: 6.8 g/dL (ref 6.5–8.1)

## 2023-09-17 LAB — CBC
HCT: 42.6 % (ref 39.0–52.0)
Hemoglobin: 14.6 g/dL (ref 13.0–17.0)
MCH: 28.9 pg (ref 26.0–34.0)
MCHC: 34.3 g/dL (ref 30.0–36.0)
MCV: 84.2 fL (ref 80.0–100.0)
Platelets: 218 10*3/uL (ref 150–400)
RBC: 5.06 MIL/uL (ref 4.22–5.81)
RDW: 13.2 % (ref 11.5–15.5)
WBC: 7.9 10*3/uL (ref 4.0–10.5)
nRBC: 0 % (ref 0.0–0.2)

## 2023-09-17 LAB — URINALYSIS, ROUTINE W REFLEX MICROSCOPIC
Bilirubin Urine: NEGATIVE
Glucose, UA: NEGATIVE mg/dL
Hgb urine dipstick: NEGATIVE
Ketones, ur: NEGATIVE mg/dL
Leukocytes,Ua: NEGATIVE
Nitrite: NEGATIVE
Protein, ur: NEGATIVE mg/dL
Specific Gravity, Urine: 1.013 (ref 1.005–1.030)
pH: 5 (ref 5.0–8.0)

## 2023-09-17 LAB — MAGNESIUM: Magnesium: 1.7 mg/dL (ref 1.7–2.4)

## 2023-09-17 LAB — LIPASE, BLOOD: Lipase: 34 U/L (ref 11–51)

## 2023-09-17 MED ORDER — ONDANSETRON HCL 4 MG/2ML IJ SOLN
4.0000 mg | Freq: Once | INTRAMUSCULAR | Status: AC
  Administered 2023-09-17: 4 mg via INTRAVENOUS
  Filled 2023-09-17: qty 2

## 2023-09-17 MED ORDER — ALUM & MAG HYDROXIDE-SIMETH 200-200-20 MG/5ML PO SUSP
30.0000 mL | Freq: Once | ORAL | Status: AC
  Administered 2023-09-17: 30 mL via ORAL
  Filled 2023-09-17: qty 30

## 2023-09-17 MED ORDER — ONDANSETRON 4 MG PO TBDP
4.0000 mg | ORAL_TABLET | Freq: Once | ORAL | Status: AC | PRN
  Administered 2023-09-17: 4 mg via ORAL
  Filled 2023-09-17: qty 1

## 2023-09-17 MED ORDER — PANTOPRAZOLE SODIUM 40 MG IV SOLR
40.0000 mg | Freq: Once | INTRAVENOUS | Status: AC
  Administered 2023-09-17: 40 mg via INTRAVENOUS
  Filled 2023-09-17: qty 10

## 2023-09-17 MED ORDER — METOCLOPRAMIDE HCL 5 MG/ML IJ SOLN
10.0000 mg | INTRAMUSCULAR | Status: AC
  Administered 2023-09-17: 10 mg via INTRAVENOUS
  Filled 2023-09-17: qty 2

## 2023-09-17 MED ORDER — SUCRALFATE 1 G PO TABS: 1.0000 g | ORAL_TABLET | Freq: Four times a day (QID) | ORAL | 1 refills | Status: DC

## 2023-09-17 MED ORDER — METOCLOPRAMIDE HCL 10 MG PO TABS: 10.0000 mg | ORAL_TABLET | Freq: Four times a day (QID) | ORAL | 0 refills | Status: DC | PRN

## 2023-09-17 MED ORDER — IOHEXOL 300 MG/ML  SOLN
100.0000 mL | Freq: Once | INTRAMUSCULAR | Status: AC | PRN
  Administered 2023-09-17: 100 mL via INTRAVENOUS

## 2023-09-17 MED ORDER — SODIUM CHLORIDE 0.9 % IV BOLUS
1000.0000 mL | Freq: Once | INTRAVENOUS | Status: AC
Start: 2023-09-17 — End: 2023-09-17
  Administered 2023-09-17: 1000 mL via INTRAVENOUS

## 2023-09-17 MED ORDER — FAMOTIDINE 20 MG PO TABS: 20.0000 mg | ORAL_TABLET | Freq: Two times a day (BID) | ORAL | 0 refills | Status: DC

## 2023-09-17 MED ORDER — POTASSIUM CHLORIDE 20 MEQ PO PACK
60.0000 meq | PACK | ORAL | Status: AC
  Administered 2023-09-17: 60 meq via ORAL
  Filled 2023-09-17: qty 3

## 2023-09-17 MED ORDER — POTASSIUM CHLORIDE 10 MEQ/100ML IV SOLN
10.0000 meq | INTRAVENOUS | Status: AC
Start: 2023-09-17 — End: 2023-09-17
  Administered 2023-09-17 (×2): 10 meq via INTRAVENOUS
  Filled 2023-09-17 (×2): qty 100

## 2023-09-17 NOTE — ED Notes (Signed)
Pt stating he is able to drink ginger ale and that he ate two packs of crackers and is able to keep them down. Denies nausea. No vomiting since eating crackers.

## 2023-09-17 NOTE — ED Provider Notes (Signed)
[facility] Provider Note    Event Date/Time   First MD Initiated Contact with Patient 09/17/23 1640     (approximate)   History   Chief Complaint: Emesis and Diarrhea   HPI  Kevin Esparza is a 62 y.o. male with a history of hypertension diabetes prior stroke CHF GERD and stomach cancer who comes ED complaining of nausea vomiting diarrhea for the past 3 days, unable to tolerate oral intake.  Denies any specific pain.  No sick contacts.  No eating out or suspect food.  Denies chest pain or shortness of breath or fever          Physical Exam   Triage Vital Signs: ED Triage Vitals  Encounter Vitals Group     BP 09/17/23 1401 128/72     Systolic BP Percentile --      Diastolic BP Percentile --      Pulse Rate 09/17/23 1401 (!) 101     Resp 09/17/23 1401 18     Temp 09/17/23 1401 98.4 F (36.9 C)     Temp Source 09/17/23 1401 Oral     SpO2 09/17/23 1401 95 %     Weight --      Height --      Head Circumference --      Peak Flow --      Pain Score 09/17/23 1402 0     Pain Loc --      Pain Education --      Exclude from Growth Chart --     Most recent vital signs: Vitals:   09/17/23 1756 09/17/23 2100  BP: (!) 140/80 139/71  Pulse: 85 72  Resp: 20 18  Temp:    SpO2: 100% 98%    General: Awake, no distress.  CV:  Good peripheral perfusion.  Resp:  Normal effort.  Abd:  No distention.  Soft, nontender Other:  Dry oral mucosa   ED Results / Procedures / Treatments   Labs (all labs ordered are listed, but only abnormal results are displayed) Labs Reviewed  COMPREHENSIVE METABOLIC PANEL - Abnormal; Notable for the following components:      Result Value   Sodium 134 (*)    Potassium 2.6 (*)    Glucose, Bld 171 (*)    BUN <5 (*)    Calcium 8.7 (*)    All other components within normal limits  URINALYSIS, ROUTINE W REFLEX MICROSCOPIC - Abnormal; Notable for the following components:   Color, Urine YELLOW (*)    APPearance  CLEAR (*)    All other components within normal limits  LIPASE, BLOOD  CBC  MAGNESIUM     EKG Interpreted by me Sinus rhythm rate of 83.  Normal axis, normal intervals.  Poor R wave progression.  Normal ST segments and T waves   RADIOLOGY CT abdomen pelvis interpreted by me, negative for bowel obstruction or free air.  Radiology report reviewed   PROCEDURES:  Procedures   MEDICATIONS ORDERED IN ED: Medications  potassium chloride (KLOR-CON) packet 60 mEq (has no administration in time range)  ondansetron (ZOFRAN-ODT) disintegrating tablet 4 mg (4 mg Oral Given 09/17/23 1405)  sodium chloride 0.9 % bolus 1,000 mL (0 mLs Intravenous Stopped 09/17/23 1928)  metoCLOPramide (REGLAN) injection 10 mg (10 mg Intravenous Given 09/17/23 1741)  pantoprazole (PROTONIX) injection 40 mg (40 mg Intravenous Given 09/17/23 1741)  potassium chloride 10 mEq in 100 mL IVPB (0 mEq Intravenous Stopped 09/17/23 2121)  iohexol (OMNIPAQUE) 300  MG/ML solution 100 mL (100 mLs Intravenous Contrast Given 09/17/23 1900)  ondansetron (ZOFRAN) injection 4 mg (4 mg Intravenous Given 09/17/23 2106)  alum & mag hydroxide-simeth (MAALOX/MYLANTA) 200-200-20 MG/5ML suspension 30 mL (30 mLs Oral Given 09/17/23 2106)     IMPRESSION / MDM / ASSESSMENT AND PLAN / ED COURSE  I reviewed the triage vital signs and the nursing notes.  DDx: Gastritis, pancreatitis, viral illness, dehydration, AKI, electrolyte abnormality, bowel obstruction  Patient's presentation is most consistent with acute presentation with potential threat to life or bodily function.  Patient presents with nausea vomiting diarrhea for the past several days.  Clinically appears dehydrated with benign abdominal exam.  Vital signs are reassuring.  Labs are essentially normal except for potassium of 2.6 which I think is due to GI losses.  LFTs are normal.  Will give IV fluids, Protonix, Reglan and p.o. trial.   Clinical Course as of 09/17/23 2227   Sat Sep 17, 2023  1842 After meds, still not able to tolerate PO at all. Tried cracker and threw up immediately. Will obtain CT a/p, give IV K+ [PS]  1946 CT negative [PS]    Clinical Course User Index [PS] Sharman Cheek, MD    ----------------------------------------- 10:27 PM on 09/17/2023 ----------------------------------------- After Maalox and additional Zofran, patient reports symptoms are resolved.  He is tolerating oral intake and ambulatory.  Will continue on antacids.  Stable for discharge.   FINAL CLINICAL IMPRESSION(S) / ED DIAGNOSES   Final diagnoses:  Acute gastritis without hemorrhage, unspecified gastritis type     Rx / DC Orders   ED Discharge Orders          Ordered    metoCLOPramide (REGLAN) 10 MG tablet  Every 6 hours PRN        09/17/23 2225    famotidine (PEPCID) 20 MG tablet  2 times daily        09/17/23 2225    sucralfate (CARAFATE) 1 g tablet  4 times daily        09/17/23 2225             Note:  This document was prepared using Dragon voice recognition software and may include unintentional dictation errors.   Sharman Cheek, MD 09/17/23 2227

## 2023-09-17 NOTE — ED Notes (Signed)
Pt taken to CT.

## 2023-09-17 NOTE — ED Triage Notes (Signed)
Pt c/o N/V/D x 3 days, states he can't keep anything down. Pt denies spoiled food or exposure to known illness. Pt is AOX4, NAD noted.

## 2023-09-17 NOTE — ED Notes (Signed)
Pt given graham crackers and ginger ale for PO challenge °

## 2023-09-17 NOTE — ED Notes (Signed)
Pt vomiting and needing to go to [facility].

## 2023-09-17 NOTE — ED Notes (Signed)
Dr Rosalia Hammers notified of potassium 2.6. will obtain EKG

## 2023-09-17 NOTE — ED Notes (Signed)
Pt unable to tolerate 179mL/h rate of IV potassium. MD made aware. Pt rate now 84mL/hr per MD.

## 2023-10-17 ENCOUNTER — Emergency Department: Payer: Medicare HMO

## 2023-10-17 ENCOUNTER — Inpatient Hospital Stay
Admission: EM | Admit: 2023-10-17 | Discharge: 2023-10-21 | DRG: 392 | Disposition: A | Discharge status: Home / Self Care | Payer: Medicare HMO | Attending: Internal Medicine | Admitting: Internal Medicine

## 2023-10-17 ENCOUNTER — Other Ambulatory Visit: Payer: Self-pay

## 2023-10-17 ENCOUNTER — Encounter: Payer: Self-pay | Admitting: Intensive Care

## 2023-10-17 DIAGNOSIS — Z6822 Body mass index (BMI) 22.0-22.9, adult: Secondary | ICD-10-CM

## 2023-10-17 DIAGNOSIS — I10 Essential (primary) hypertension: Secondary | ICD-10-CM | POA: Diagnosis not present

## 2023-10-17 DIAGNOSIS — D120 Benign neoplasm of cecum: Secondary | ICD-10-CM | POA: Diagnosis present

## 2023-10-17 DIAGNOSIS — N400 Enlarged prostate without lower urinary tract symptoms: Secondary | ICD-10-CM | POA: Diagnosis present

## 2023-10-17 DIAGNOSIS — M109 Gout, unspecified: Secondary | ICD-10-CM | POA: Diagnosis present

## 2023-10-17 DIAGNOSIS — D122 Benign neoplasm of ascending colon: Secondary | ICD-10-CM | POA: Diagnosis present

## 2023-10-17 DIAGNOSIS — Z7984 Long term (current) use of oral hypoglycemic drugs: Secondary | ICD-10-CM

## 2023-10-17 DIAGNOSIS — T182XXA Foreign body in stomach, initial encounter: Secondary | ICD-10-CM | POA: Diagnosis present

## 2023-10-17 DIAGNOSIS — K219 Gastro-esophageal reflux disease without esophagitis: Secondary | ICD-10-CM | POA: Diagnosis present

## 2023-10-17 DIAGNOSIS — E785 Hyperlipidemia, unspecified: Secondary | ICD-10-CM | POA: Diagnosis not present

## 2023-10-17 DIAGNOSIS — E876 Hypokalemia: Secondary | ICD-10-CM | POA: Diagnosis present

## 2023-10-17 DIAGNOSIS — Z7901 Long term (current) use of anticoagulants: Secondary | ICD-10-CM

## 2023-10-17 DIAGNOSIS — I110 Hypertensive heart disease with heart failure: Secondary | ICD-10-CM | POA: Diagnosis present

## 2023-10-17 DIAGNOSIS — I4891 Unspecified atrial fibrillation: Secondary | ICD-10-CM | POA: Diagnosis present

## 2023-10-17 DIAGNOSIS — Z85028 Personal history of other malignant neoplasm of stomach: Secondary | ICD-10-CM

## 2023-10-17 DIAGNOSIS — Z9689 Presence of other specified functional implants: Secondary | ICD-10-CM | POA: Diagnosis present

## 2023-10-17 DIAGNOSIS — R197 Diarrhea, unspecified: Secondary | ICD-10-CM | POA: Diagnosis present

## 2023-10-17 DIAGNOSIS — D3A8 Other benign neuroendocrine tumors: Principal | ICD-10-CM | POA: Diagnosis present

## 2023-10-17 DIAGNOSIS — R112 Nausea with vomiting, unspecified: Secondary | ICD-10-CM | POA: Diagnosis not present

## 2023-10-17 DIAGNOSIS — Z79899 Other long term (current) drug therapy: Secondary | ICD-10-CM

## 2023-10-17 DIAGNOSIS — I5032 Chronic diastolic (congestive) heart failure: Secondary | ICD-10-CM | POA: Diagnosis present

## 2023-10-17 DIAGNOSIS — K529 Noninfective gastroenteritis and colitis, unspecified: Secondary | ICD-10-CM | POA: Diagnosis present

## 2023-10-17 DIAGNOSIS — E440 Moderate protein-calorie malnutrition: Secondary | ICD-10-CM | POA: Diagnosis present

## 2023-10-17 DIAGNOSIS — Z8249 Family history of ischemic heart disease and other diseases of the circulatory system: Secondary | ICD-10-CM

## 2023-10-17 DIAGNOSIS — W449XXA Unspecified foreign body entering into or through a natural orifice, initial encounter: Secondary | ICD-10-CM | POA: Diagnosis present

## 2023-10-17 DIAGNOSIS — E119 Type 2 diabetes mellitus without complications: Secondary | ICD-10-CM | POA: Diagnosis present

## 2023-10-17 DIAGNOSIS — C7A8 Other malignant neuroendocrine tumors: Secondary | ICD-10-CM | POA: Diagnosis present

## 2023-10-17 DIAGNOSIS — N401 Enlarged prostate with lower urinary tract symptoms: Secondary | ICD-10-CM

## 2023-10-17 DIAGNOSIS — F17220 Nicotine dependence, chewing tobacco, uncomplicated: Secondary | ICD-10-CM | POA: Diagnosis present

## 2023-10-17 DIAGNOSIS — M25561 Pain in right knee: Secondary | ICD-10-CM | POA: Diagnosis present

## 2023-10-17 DIAGNOSIS — Z8673 Personal history of transient ischemic attack (TIA), and cerebral infarction without residual deficits: Secondary | ICD-10-CM

## 2023-10-17 DIAGNOSIS — Z800 Family history of malignant neoplasm of digestive organs: Secondary | ICD-10-CM

## 2023-10-17 DIAGNOSIS — D123 Benign neoplasm of transverse colon: Secondary | ICD-10-CM | POA: Diagnosis present

## 2023-10-17 DIAGNOSIS — K222 Esophageal obstruction: Secondary | ICD-10-CM | POA: Diagnosis present

## 2023-10-17 DIAGNOSIS — Z8616 Personal history of COVID-19: Secondary | ICD-10-CM

## 2023-10-17 HISTORY — DX: Nausea with vomiting, unspecified: R11.2

## 2023-10-17 LAB — CBC WITH DIFFERENTIAL/PLATELET
Abs Immature Granulocytes: 0.06 10*3/uL (ref 0.00–0.07)
Basophils Absolute: 0 10*3/uL (ref 0.0–0.1)
Basophils Relative: 0 %
Eosinophils Absolute: 0.1 10*3/uL (ref 0.0–0.5)
Eosinophils Relative: 1 %
HCT: 38.7 % — ABNORMAL LOW (ref 39.0–52.0)
Hemoglobin: 14.2 g/dL (ref 13.0–17.0)
Immature Granulocytes: 1 %
Lymphocytes Relative: 28 %
Lymphs Abs: 1.8 10*3/uL (ref 0.7–4.0)
MCH: 28.7 pg (ref 26.0–34.0)
MCHC: 36.7 g/dL — ABNORMAL HIGH (ref 30.0–36.0)
MCV: 78.2 fL — ABNORMAL LOW (ref 80.0–100.0)
Monocytes Absolute: 1 10*3/uL (ref 0.1–1.0)
Monocytes Relative: 15 %
Neutro Abs: 3.6 10*3/uL (ref 1.7–7.7)
Neutrophils Relative %: 55 %
Platelets: 257 10*3/uL (ref 150–400)
RBC: 4.95 MIL/uL (ref 4.22–5.81)
RDW: 13.2 % (ref 11.5–15.5)
WBC: 6.5 10*3/uL (ref 4.0–10.5)
nRBC: 0 % (ref 0.0–0.2)

## 2023-10-17 LAB — PHOSPHORUS: Phosphorus: 2 mg/dL — ABNORMAL LOW (ref 2.5–4.6)

## 2023-10-17 LAB — COMPREHENSIVE METABOLIC PANEL
ALT: 12 U/L (ref 0–44)
AST: 20 U/L (ref 15–41)
Albumin: 3 g/dL — ABNORMAL LOW (ref 3.5–5.0)
Alkaline Phosphatase: 72 U/L (ref 38–126)
Anion gap: 10 (ref 5–15)
BUN: <5 mg/dL — ABNORMAL LOW (ref 8–23)
CO2: 31 mmol/L (ref 22–32)
Calcium: 8.5 mg/dL — ABNORMAL LOW (ref 8.9–10.3)
Chloride: 95 mmol/L — ABNORMAL LOW (ref 98–111)
Creatinine, Ser: 0.79 mg/dL (ref 0.61–1.24)
GFR, Estimated: >60 mL/min (ref >60)
Glucose, Bld: 150 mg/dL — ABNORMAL HIGH (ref 70–99)
Potassium: 2.1 mmol/L — CL (ref 3.5–5.1)
Sodium: 136 mmol/L (ref 135–145)
Total Bilirubin: 1.1 mg/dL (ref <1.2)
Total Protein: 6.1 g/dL — ABNORMAL LOW (ref 6.5–8.1)

## 2023-10-17 LAB — PROCALCITONIN: Procalcitonin: <0.1 ng/mL

## 2023-10-17 LAB — URINALYSIS, ROUTINE W REFLEX MICROSCOPIC
Bacteria, UA: NONE SEEN
Bilirubin Urine: NEGATIVE
Glucose, UA: NEGATIVE mg/dL
Hgb urine dipstick: NEGATIVE
Ketones, ur: NEGATIVE mg/dL
Leukocytes,Ua: NEGATIVE
Nitrite: NEGATIVE
Protein, ur: 30 mg/dL — AB
Specific Gravity, Urine: 1.01 (ref 1.005–1.030)
pH: 7 (ref 5.0–8.0)

## 2023-10-17 LAB — CBG MONITORING, ED: Glucose-Capillary: 157 mg/dL — ABNORMAL HIGH (ref 70–99)

## 2023-10-17 LAB — MAGNESIUM
Magnesium: 2 mg/dL (ref 1.7–2.4)
Magnesium: 1.8 mg/dL (ref 1.7–2.4)

## 2023-10-17 LAB — BRAIN NATRIURETIC PEPTIDE: B Natriuretic Peptide: 23 pg/mL (ref 0.0–100.0)

## 2023-10-17 LAB — LIPASE, BLOOD: Lipase: 27 U/L (ref 11–51)

## 2023-10-17 MED ORDER — IOHEXOL 300 MG/ML  SOLN
100.0000 mL | Freq: Once | INTRAMUSCULAR | Status: AC | PRN
  Administered 2023-10-17: 100 mL via INTRAVENOUS

## 2023-10-17 MED ORDER — ACETAMINOPHEN 325 MG PO TABS: 650.0000 mg | ORAL_TABLET | Freq: Four times a day (QID) | ORAL | Status: DC | PRN

## 2023-10-17 MED ORDER — MECLIZINE HCL 25 MG PO TABS: 25.0000 mg | ORAL_TABLET | Freq: Four times a day (QID) | ORAL | Status: DC | PRN

## 2023-10-17 MED ORDER — TAMSULOSIN HCL 0.4 MG PO CAPS
0.4000 mg | ORAL_CAPSULE | Freq: Every day | ORAL | Status: DC
Start: 2023-10-18 — End: 2023-10-19
  Administered 2023-10-18 – 2023-10-19 (×2): 0.4 mg via ORAL
  Filled 2023-10-17 (×2): qty 1

## 2023-10-17 MED ORDER — POTASSIUM CHLORIDE CRYS ER 20 MEQ PO TBCR: 40.0000 meq | EXTENDED_RELEASE_TABLET | Freq: Once | ORAL | Status: DC

## 2023-10-17 MED ORDER — MORPHINE SULFATE (PF) 2 MG/ML IV SOLN
2.0000 mg | INTRAVENOUS | Status: DC | PRN
  Administered 2023-10-18 – 2023-10-21 (×9): 2 mg via INTRAVENOUS
  Filled 2023-10-17 (×9): qty 1

## 2023-10-17 MED ORDER — PIPERACILLIN-TAZOBACTAM 3.375 G IVPB
3.3750 g | Freq: Three times a day (TID) | INTRAVENOUS | Status: DC
Start: 2023-10-17 — End: 2023-10-19
  Administered 2023-10-17 – 2023-10-19 (×5): 3.375 g via INTRAVENOUS
  Filled 2023-10-17 (×5): qty 50

## 2023-10-17 MED ORDER — INSULIN ASPART 100 UNIT/ML IJ SOLN: 0.0000 [IU] | Freq: Every day | INTRAMUSCULAR | Status: DC

## 2023-10-17 MED ORDER — SODIUM CHLORIDE 0.9 % IV SOLN: INTRAVENOUS | Status: AC

## 2023-10-17 MED ORDER — ONDANSETRON HCL 4 MG/2ML IJ SOLN
4.0000 mg | Freq: Three times a day (TID) | INTRAMUSCULAR | Status: DC | PRN
  Administered 2023-10-20: 4 mg via INTRAVENOUS
  Filled 2023-10-17: qty 2

## 2023-10-17 MED ORDER — TIZANIDINE HCL 4 MG PO TABS
4.0000 mg | ORAL_TABLET | Freq: Every day | ORAL | Status: DC
Start: 2023-10-17 — End: 2023-10-21
  Administered 2023-10-17 – 2023-10-20 (×4): 4 mg via ORAL
  Filled 2023-10-17 (×3): qty 1
  Filled 2023-10-17: qty 2

## 2023-10-17 MED ORDER — POTASSIUM CHLORIDE 10 MEQ/100ML IV SOLN
10.0000 meq | Freq: Once | INTRAVENOUS | Status: AC
  Administered 2023-10-17: 10 meq via INTRAVENOUS
  Filled 2023-10-17: qty 100

## 2023-10-17 MED ORDER — COLCHICINE 0.6 MG PO TABS: 0.6000 mg | ORAL_TABLET | Freq: Two times a day (BID) | ORAL | Status: DC | PRN

## 2023-10-17 MED ORDER — POTASSIUM PHOSPHATES 15 MMOLE/5ML IV SOLN
30.0000 mmol | Freq: Once | INTRAVENOUS | Status: AC
  Administered 2023-10-17: 30 mmol via INTRAVENOUS
  Filled 2023-10-17: qty 10

## 2023-10-17 MED ORDER — METOPROLOL SUCCINATE ER 25 MG PO TB24
25.0000 mg | ORAL_TABLET | Freq: Every evening | ORAL | Status: DC
  Administered 2023-10-17 – 2023-10-20 (×4): 25 mg via ORAL
  Filled 2023-10-17 (×4): qty 1

## 2023-10-17 MED ORDER — MAGNESIUM SULFATE 2 GM/50ML IV SOLN
2.0000 g | Freq: Once | INTRAVENOUS | Status: AC
  Administered 2023-10-17: 2 g via INTRAVENOUS
  Filled 2023-10-17: qty 50

## 2023-10-17 MED ORDER — TIZANIDINE HCL 2 MG PO TABS
2.0000 mg | ORAL_TABLET | Freq: Three times a day (TID) | ORAL | Status: DC
Start: 2023-10-17 — End: 2023-10-17
  Filled 2023-10-17: qty 1

## 2023-10-17 MED ORDER — INSULIN ASPART 100 UNIT/ML IJ SOLN
0.0000 [IU] | Freq: Three times a day (TID) | INTRAMUSCULAR | Status: DC
  Administered 2023-10-20: 1 [IU] via SUBCUTANEOUS
  Filled 2023-10-17 (×3): qty 1

## 2023-10-17 MED ORDER — APIXABAN 5 MG PO TABS
5.0000 mg | ORAL_TABLET | Freq: Two times a day (BID) | ORAL | Status: DC
Start: 2023-10-17 — End: 2023-10-18
  Administered 2023-10-17 – 2023-10-18 (×2): 5 mg via ORAL
  Filled 2023-10-17 (×2): qty 1

## 2023-10-17 MED ORDER — AMLODIPINE BESYLATE 5 MG PO TABS
5.0000 mg | ORAL_TABLET | Freq: Every day | ORAL | Status: DC
  Administered 2023-10-17 – 2023-10-21 (×5): 5 mg via ORAL
  Filled 2023-10-17 (×5): qty 1

## 2023-10-17 MED ORDER — ALBUTEROL SULFATE (2.5 MG/3ML) 0.083% IN NEBU: 2.5000 mg | INHALATION_SOLUTION | RESPIRATORY_TRACT | Status: DC | PRN

## 2023-10-17 MED ORDER — PANTOPRAZOLE SODIUM 40 MG IV SOLR
40.0000 mg | Freq: Once | INTRAVENOUS | Status: AC
  Administered 2023-10-17: 40 mg via INTRAVENOUS
  Filled 2023-10-17: qty 10

## 2023-10-17 MED ORDER — LACTATED RINGERS IV SOLN: INTRAVENOUS | Status: DC

## 2023-10-17 MED ORDER — HYDRALAZINE HCL 20 MG/ML IJ SOLN: 5.0000 mg | INTRAMUSCULAR | Status: DC | PRN

## 2023-10-17 NOTE — ED Triage Notes (Signed)
Patient c/o emesis over 1 month. Seen for same on 09/17/23. Has appointment scheduled with GI in March

## 2023-10-17 NOTE — H&P (Signed)
History and Physical    Kevin Esparza ZOX:[id number] DOB: 1961/07/01 DOA: 10/17/2023  Referring MD/NP/PA:   PCP: Shane Crutch, PA   Patient coming from:  The patient is coming from home.     Chief Complaint: Nausea, vomiting, diarrhea, dysuria  HPI: Trigg Bankowski is a 62 y.o. male with medical history significant of stomach neuroendocrine carcinoma, HTN, HLD, DM,  dCHF, stroke, gout, BPH, A-fib on Eliquis, who presents with nausea, vomiting, diarrhea.   Patient states that she has nausea, vomiting and diarrhea for almost 6 weeks. He has 2-3 times of nonbilious nonbloody vomiting each day, with 5-10 times of watery diarrhea each day.  No significant abdominal pain.  No fever or chills.  Patient does not have chest pain, cough, SOB.  He has dysuria, no burning or urination or hematuria.  No flank pain.  Data reviewed independently and ED Course: pt was found to have WBC 6.5, GFR> 60, potassium 2.1, magnesium 1.8, phosphorus 2.0, temperature normal, blood pressure 123/64, heart rate 96 --> 83, RR 16, oxygen saturation 98% on room air.  CT scan of abdomen/pelvis showed a possible right colitis.  Pt is placed on telemetry bed for observation.  CT scan of abdomen/pelvis: 1. Colon is decompressed which accentuates wall thickness, but the wall of the cecum and ascending colon is ill-defined raising the question of mural edema. While not definite, imaging features could be related to right-sided colitis. 2. No evidence for small bowel obstruction. 3. Prostatomegaly. 4.  Aortic Atherosclerosis (ICD10-I70.0).  EKG: I have personally reviewed.  QTc 473, seems to have sinus rhythm with intermittent PAC and A-fib   Review of Systems:   General: no fevers, chills, no body weight gain, has poor appetite, has fatigue HEENT: no blurry vision, hearing changes or sore throat Respiratory: no dyspnea, coughing, wheezing CV: no chest pain, no palpitations GI: has nausea, vomiting,  diarrhea,  abdominal pain, GU: no dysuria, burning on urination, increased urinary frequency, hematuria  Ext: no leg edema Neuro: no unilateral weakness, numbness, or tingling, no vision change or hearing loss Skin: no rash, no skin tear. MSK: No muscle spasm, no deformity, no limitation of range of movement in spin Heme: No easy bruising.  Travel history: No recent long distant travel.   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   Pneumonia    several times   Stomach cancer (HCC)    Stroke [facility])     Past Surgical History:  Procedure Laterality Date   APPENDECTOMY  2005   BIOPSY  03/19/2021   Procedure: BIOPSY;  Surgeon: Lemar Lofty., MD;  Location: [facility] ENDOSCOPY;  Service: Gastroenterology;;   BIOPSY  05/13/2022   Procedure: BIOPSY;  Surgeon: Lemar Lofty., MD;  Location: [facility] ENDOSCOPY;  Service: Gastroenterology;;   CERVICAL SPINE SURGERY  1997   c4-c7   CHOLECYSTECTOMY  2005   COLONOSCOPY WITH PROPOFOL N/A 12/31/2020   Procedure: COLONOSCOPY WITH PROPOFOL;  Surgeon: Pasty Spillers, MD;  Location: [facility] ENDOSCOPY;  Service: Endoscopy;  Laterality: N/A;   COLONOSCOPY WITH PROPOFOL N/A 03/19/2021   Procedure: COLONOSCOPY WITH PROPOFOL;  Surgeon: Meridee Score Netty Starring., MD;  Location: [facility] ENDOSCOPY;  Service: Gastroenterology;  Laterality: N/A;   COLONOSCOPY WITH PROPOFOL N/A 05/13/2022   Procedure: COLONOSCOPY WITH PROPOFOL;  Surgeon: Meridee Score Netty Starring., MD;  Location: [facility] ENDOSCOPY;  Service: Gastroenterology;  Laterality: N/A;   ENDOSCOPIC  MUCOSAL RESECTION  03/19/2021   Procedure: ENDOSCOPIC MUCOSAL RESECTION;  Surgeon: Meridee Score Netty Starring., MD;  Location: [facility] ENDOSCOPY;  Service: Gastroenterology;;   ENDOSCOPIC MUCOSAL RESECTION N/A 05/13/2022   Procedure: ENDOSCOPIC MUCOSAL RESECTION;  Surgeon: Lemar Lofty., MD;  Location: [facility] ENDOSCOPY;  Service: Gastroenterology;  Laterality: N/A;   ESOPHAGOGASTRODUODENOSCOPY (EGD) WITH PROPOFOL N/A 12/31/2020   Procedure: ESOPHAGOGASTRODUODENOSCOPY (EGD) WITH PROPOFOL;  Surgeon: Pasty Spillers, MD;  Location: [facility] ENDOSCOPY;  Service: Endoscopy;  Laterality: N/A;   ESOPHAGOGASTRODUODENOSCOPY (EGD) WITH PROPOFOL N/A 03/19/2021   Procedure: ESOPHAGOGASTRODUODENOSCOPY (EGD) WITH PROPOFOL;  Surgeon: Meridee Score Netty Starring., MD;  Location: [facility] ENDOSCOPY;  Service: Gastroenterology;  Laterality: N/A;   ESOPHAGOGASTRODUODENOSCOPY (EGD) WITH PROPOFOL N/A 05/13/2022   Procedure: ESOPHAGOGASTRODUODENOSCOPY (EGD) WITH PROPOFOL;  Surgeon: Meridee Score Netty Starring., MD;  Location: [facility] ENDOSCOPY;  Service: Gastroenterology;  Laterality: N/A;   HEMOSTASIS CLIP PLACEMENT  03/19/2021   Procedure: HEMOSTASIS CLIP PLACEMENT;  Surgeon: Lemar Lofty., MD;  Location: [facility] ENDOSCOPY;  Service: Gastroenterology;;   HEMOSTASIS CLIP PLACEMENT  05/13/2022   Procedure: HEMOSTASIS CLIP PLACEMENT;  Surgeon: Lemar Lofty., MD;  Location: [facility] ENDOSCOPY;  Service: Gastroenterology;;   MULTIPLE TOOTH EXTRACTIONS     dentures   POLYPECTOMY  03/19/2021   Procedure: POLYPECTOMY;  Surgeon: Lemar Lofty., MD;  Location: [facility] ENDOSCOPY;  Service: Gastroenterology;;   POLYPECTOMY  05/13/2022   Procedure: POLYPECTOMY;  Surgeon: Lemar Lofty., MD;  Location: [facility] ENDOSCOPY;  Service: Gastroenterology;;  EGD and colon   SUBMUCOSAL LIFTING INJECTION  03/19/2021   Procedure: SUBMUCOSAL LIFTING INJECTION;  Surgeon: Lemar Lofty., MD;  Location: [facility] ENDOSCOPY;  Service: Gastroenterology;;    SUBMUCOSAL LIFTING INJECTION  05/13/2022   Procedure: SUBMUCOSAL LIFTING INJECTION;  Surgeon: Lemar Lofty., MD;  Location: [facility] ENDOSCOPY;  Service: Gastroenterology;;   SUBMUCOSAL TATTOO INJECTION  05/13/2022   Procedure: SUBMUCOSAL TATTOO INJECTION;  Surgeon: Lemar Lofty., MD;  Location: [facility] ENDOSCOPY;  Service: Gastroenterology;;   TONSILLECTOMY     UPPER ESOPHAGEAL ENDOSCOPIC ULTRASOUND (EUS) N/A 03/19/2021   Procedure: UPPER ESOPHAGEAL ENDOSCOPIC ULTRASOUND (EUS);  Surgeon: Lemar Lofty., MD;  Location: [facility] ENDOSCOPY;  Service: Gastroenterology;  Laterality: N/A;    Social History:  reports that he quit smoking about 19 years ago. His smoking use included cigarettes. He started smoking about 33 years ago. His smokeless tobacco use includes chew. He reports that he does not currently use alcohol after a past usage of about 1.0 standard drink of alcohol per week. He reports that he does not currently use drugs after having used the following drugs: Marijuana.  Family History:  Family History  Problem Relation Age of Onset   Hypertension Mother    Diabetes Mother    Osteoarthritis Mother    Osteoporosis Mother    Stroke Father    Cancer Father        unknown origin   Stomach cancer Maternal Grandmother    Kidney cancer Maternal Grandmother    Liver cancer Maternal Grandmother    Lung cancer Maternal Grandfather    Kidney disease Neg Hx  Prostate cancer Neg Hx    Bladder Cancer Neg Hx    Colon cancer Neg Hx    Esophageal cancer Neg Hx    Inflammatory bowel disease Neg Hx    Pancreatic cancer Neg Hx    Rectal cancer Neg Hx    Colon polyps Neg Hx      Prior to Admission medications   Medication Sig Start Date End Date Taking? Authorizing Provider  acetaminophen (TYLENOL) 650 MG CR tablet Take 1,300 mg by mouth See admin instructions. Take 1300 mg in the morning, may take a second 1300 mg dose during the day as needed for pain    [provider]   albuterol (VENTOLIN HFA) 108 (90 Base) MCG/ACT inhaler Inhale 2 puffs into the lungs every 6 (six) hours as needed for wheezing or shortness of breath.    [provider]  amLODipine (NORVASC) 5 MG tablet Take 1 tablet (5 mg total) by mouth daily. 07/29/22   Sunnie Nielsen, DO  apixaban (ELIQUIS) 5 MG TABS tablet Take 1 tablet (5 mg total) by mouth 2 (two) times daily. 07/07/23   Debbe Odea, MD  atorvastatin (LIPITOR) 40 MG tablet Take 1 tablet (40 mg total) by mouth daily. 07/29/22   Sunnie Nielsen, DO  colchicine 0.6 MG tablet Take 0.6 mg by mouth 2 (two) times daily as needed (gout).    [provider]  cyclobenzaprine (FLEXERIL) 10 MG tablet Take 10 mg by mouth 3 (three) times daily as needed for muscle spasms.    [provider]  enalapril (VASOTEC) 10 MG tablet Take 1 tablet (10 mg total) by mouth daily. 07/29/22   Sunnie Nielsen, DO  famotidine (PEPCID) 20 MG tablet Take 1 tablet (20 mg total) by mouth 2 (two) times daily. 09/17/23   Sharman Cheek, MD  finasteride (PROSCAR) 5 MG tablet Take 1 tablet (5 mg total) by mouth daily. 05/11/23   Vanna Scotland, MD  gabapentin (NEURONTIN) 300 MG capsule Take 300 mg p.o. every morning, take 600 mg p.o. nightly 07/28/22   Sunnie Nielsen, DO  meclizine (ANTIVERT) 25 MG tablet Take 25 mg by mouth every 6 (six) hours as needed. 02/07/23   [provider]  metFORMIN (GLUCOPHAGE) 1000 MG tablet Take 1,000 mg by mouth daily with breakfast.    [provider]  metoCLOPramide (REGLAN) 10 MG tablet Take 1 tablet (10 mg total) by mouth every 6 (six) hours as needed. 09/17/23   Sharman Cheek, MD  metoprolol succinate (TOPROL XL) 25 MG 24 hr tablet Take 1 tablet (25 mg total) by mouth every evening. 01/26/23   Lanier Prude, MD  omeprazole (PRILOSEC OTC) 20 MG tablet Take 20 mg by mouth daily.    [provider]  omeprazole (PRILOSEC) 20 MG capsule Take 20 mg by mouth 2 (two) times  daily. 04/21/23   [provider]  ondansetron (ZOFRAN) 8 MG tablet Take 8 mg by mouth 3 (three) times daily. 01/26/23   [provider]  sucralfate (CARAFATE) 1 g tablet Take 1 tablet (1 g total) by mouth 4 (four) times daily. 09/17/23   Sharman Cheek, MD  tamsulosin (FLOMAX) 0.4 MG CAPS capsule Take 1 capsule (0.4 mg total) by mouth daily. 03/16/22   Michiel Cowboy A, PA-C  tiZANidine (ZANAFLEX) 4 MG tablet Takes 1/2 tablet in the daytime and takes 1 tablet at bedtime daily 03/14/23   [provider]    Physical Exam: Vitals:   10/17/23 1912 10/17/23 1930 10/17/23 2000 10/17/23 2014  BP:  (!) 159/63 (!) 140/66   Pulse:  87 95   Resp:  10 12   Temp: 98.3 F (36.8 C)     TempSrc: Oral     SpO2:  96% 98%   Weight:   68 kg 68 kg  Height:    5\' 10"  (1.778 m)   General: Not in acute distress.  Has dry mucous membrane HEENT:       Eyes: PERRL, EOMI, no jaundice       ENT: No discharge from the ears and nose, no pharynx injection, no tonsillar enlargement.        Neck: No JVD, no bruit, no mass felt. Heme: No neck lymph node enlargement. Cardiac: S1/S2, irregular rhythm, no murmurs, No gallops or rubs. Respiratory: No rales, wheezing, rhonchi or rubs. GI: Soft, nondistended, nontender, no rebound pain, no organomegaly, BS present. GU: No hematuria Ext: No pitting leg edema bilaterally. 1+DP/PT pulse bilaterally. Musculoskeletal: No joint deformities, No joint redness or warmth, no limitation of ROM in spin. Skin: No rashes.  Neuro: Alert, oriented X3, cranial nerves II-XII grossly intact, moves all extremities normally.  Psych: Patient is not psychotic, no suicidal or hemocidal ideation.  Labs on Admission: I have personally reviewed following labs and imaging studies  CBC: Recent Labs  Lab 10/17/23 0942  WBC 6.5  NEUTROABS 3.6  HGB 14.2  HCT 38.7*  MCV 78.2*  PLT 257   Basic Metabolic Panel: Recent Labs  Lab 10/17/23 0942 10/17/23 1527  NA  136  --   K 2.1*  --   CL 95*  --   CO2 31  --   GLUCOSE 150*  --   BUN <5*  --   CREATININE 0.79  --   CALCIUM 8.5*  --   MG 2.0 1.8  PHOS  --  2.0*   GFR: Estimated Creatinine Clearance: 90.9 mL/min (by C-G formula based on SCr of 0.79 mg/dL). Liver Function Tests: Recent Labs  Lab 10/17/23 0942  AST 20  ALT 12  ALKPHOS 72  BILITOT 1.1  PROT 6.1*  ALBUMIN 3.0*   Recent Labs  Lab 10/17/23 0942  LIPASE 27   No results for input(s): "AMMONIA" in the last 168 hours. Coagulation Profile: No results for input(s): "INR", "PROTIME" in the last 168 hours. Cardiac Enzymes: No results for input(s): "CKTOTAL", "CKMB", "CKMBINDEX", "TROPONINI" in the last 168 hours. BNP (last 3 results) No results for input(s): "PROBNP" in the last 8760 hours. HbA1C: No results for input(s): "HGBA1C" in the last 72 hours. CBG: No results for input(s): "GLUCAP" in the last 168 hours. Lipid Profile: No results for input(s): "CHOL", "HDL", "LDLCALC", "TRIG", "CHOLHDL", "LDLDIRECT" in the last 72 hours. Thyroid Function Tests: No results for input(s): "TSH", "T4TOTAL", "FREET4", "T3FREE", "THYROIDAB" in the last 72 hours. Anemia Panel: No results for input(s): "VITAMINB12", "FOLATE", "FERRITIN", "TIBC", "IRON", "RETICCTPCT" in the last 72 hours. Urine analysis:    Component Value Date/Time   COLORURINE YELLOW (A) 10/17/2023 0942   APPEARANCEUR HAZY (A) 10/17/2023 0942   APPEARANCEUR Clear 04/20/2023 0852   LABSPEC 1.010 10/17/2023 0942   PHURINE 7.0 10/17/2023 0942   GLUCOSEU NEGATIVE 10/17/2023 0942   HGBUR NEGATIVE 10/17/2023 0942   BILIRUBINUR NEGATIVE 10/17/2023 0942   BILIRUBINUR Negative 04/20/2023 0852   KETONESUR NEGATIVE 10/17/2023 0942   PROTEINUR 30 (A) 10/17/2023 0942   NITRITE NEGATIVE 10/17/2023 0942   LEUKOCYTESUR NEGATIVE 10/17/2023 0942   Sepsis Labs: @LABRCNTIP (procalcitonin:4,lacticidven:4) )No results found for this or any previous  visit (from the past 240 hours).    Radiological Exams on Admission: CT ABDOMEN PELVIS W CONTRAST Result Date: 10/17/2023 CLINICAL DATA:  Vomiting for 1 month. Clinical concern for bowel obstruction. EXAM: CT ABDOMEN AND PELVIS WITH CONTRAST TECHNIQUE: Multidetector CT imaging of the abdomen and pelvis was performed using the standard protocol following bolus administration of intravenous contrast. RADIATION DOSE REDUCTION: This exam was performed according to the departmental dose-optimization program which includes automated exposure control, adjustment of the mA and/or kV according to patient size and/or use of iterative reconstruction technique. CONTRAST:  OMNIPAQUE IOHEXOL 300 MG/ML  SOLN COMPARISON:  09/17/2023 FINDINGS: Lower chest: Unremarkable. Hepatobiliary: No suspicious focal abnormality within the liver parenchyma. Cholecystectomy. No intrahepatic or extrahepatic biliary dilation. Pancreas: No focal mass lesion. No dilatation of the main duct. No intraparenchymal cyst. No peripancreatic edema. Spleen: No splenomegaly. No suspicious focal mass lesion. Adrenals/Urinary Tract: Right adrenal gland unremarkable. Stable 10 mm left adrenal nodule also unchanged when comparing back to a study from 02/07/2007, consistent with benign etiology such as adenoma. No followup imaging is recommended. Tiny well-defined homogeneous low-density lesions in both kidneys are too small to characterize but are statistically most likely benign and probably cysts. No followup imaging is recommended. No evidence for hydroureter. The urinary bladder appears normal for the degree of distention. Stomach/Bowel: Stomach is unremarkable. No gastric wall thickening. No evidence of outlet obstruction. Duodenum is normally positioned as is the ligament of Treitz. No small bowel wall thickening. No small bowel dilatation. The terminal ileum is normal. The appendix is not well visualized, but there is no edema or inflammation in the region of the cecal tip to  suggest appendicitis. Colon is decompressed which accentuates wall thickness, but the wall of the cecum and ascending colon is ill-defined raising the question of mural edema. Vascular/Lymphatic: There is moderate atherosclerotic calcification of the abdominal aorta without aneurysm. There is no gastrohepatic or hepatoduodenal ligament lymphadenopathy. No retroperitoneal or mesenteric lymphadenopathy. No pelvic sidewall lymphadenopathy. Reproductive: Prostate gland is enlarged. Other: No intraperitoneal free fluid. Musculoskeletal: Stable sclerotic focus right iliac crest, likely a bone island IMPRESSION: 1. Colon is decompressed which accentuates wall thickness, but the wall of the cecum and ascending colon is ill-defined raising the question of mural edema. While not definite, imaging features could be related to right-sided colitis. 2. No evidence for small bowel obstruction. 3. Prostatomegaly. 4.  Aortic Atherosclerosis (ICD10-I70.0). Electronically Signed   By: Kennith Center M.D.   On: 10/17/2023 18:18      Assessment/Plan Principal Problem:   Nausea vomiting and diarrhea Active Problems:   Colitis   HTN (hypertension)   Hyperlipidemia   Atrial fibrillation (HCC)   Diabetes mellitus without complication (HCC)   Chronic diastolic CHF (congestive heart failure) (HCC)   Hypokalemia   Hypophosphatemia   BPH (benign prostatic hyperplasia)   Gout   Neuroendocrine carcinoma of stomach (HCC)   Assessment and Plan:  Nausea vomiting and diarrhea: Likely due to rectal colitis as evidenced by CT scan.  No fever or leukocytosis.  Clinically not septic.  Patient has history of stomach neuroendocrine carcinoma.  Will treat patient with Zosyn empirically, but if no improvement, may need to consult GI for possible EGD. -Placed on telemetry bed for observation -IV Zosyn -IV fluid: Normal saline 75 cc/h -Liquid diet now -As needed Zofran for nausea vomiting -Follow-up C. difficile and GI pathogen  panel  Possible colitis: As evidenced by CT scan -See above  HTN (hypertension): Blood pressure 123/64 -IV  hydralazine as needed -Hold enalapril since blood pressures is normal and also because patient is clinically dry. -Continue amlodipine and metoprolol  Hyperlipidemia -Lipitor  Atrial fibrillation (HCC): Heart rate 80-90s -Metoprolol -Eliquis  Diabetes mellitus without complication (HCC): Recent A1c 6.0, well-controlled.  Patient is taking metformin -Sliding scale insulin  Chronic diastolic CHF (congestive heart failure) (HCC): 2D echo 07/27/2022 showed EF of 60 to 65%.  Patient is clinically dry. -Check BNP  Hypokalemia and Hypophosphatemia: Potassium 2.1, magnesium 1.8 -Repleted both -Follow-up lab pneumonia  BPH (benign prostatic hyperplasia) -Flomax  Gout -Continue home as needed colchicine  History of neuroendocrine carcinoma of stomach (HCC): Patient had EGD on 05/13/2022 which showed no gross lesion in esophagus, a nonobstructive Schatzki's ring and left gastric esophageal junction and hiatal hernia. -Currently does not seem to have acute issues.  If antibiotic treatment is not effective for nausea vomiting, may need to consult GI for possible EGD      DVT ppx: on Eliquis  Code Status: Full code    Family Communication: not done, no family member is at bed side.      Disposition Plan:  Anticipate discharge back to previous environment  Consults called:  none  Admission status and Level of care: Telemetry Medical:     as inpt     Dispo: The patient is from: Home              Anticipated d/c is to: Home              Anticipated d/c date is: 1 day              Patient currently is not medically stable to d/c.      Severity of Illness:  The appropriate patient status for this patient is OBSERVATION. Observation status is judged to be reasonable and necessary in order to provide the required intensity of service to ensure the patient's safety. The  patient's presenting symptoms, physical exam findings, and initial radiographic and laboratory data in the context of their medical condition is felt to place them at decreased risk for further clinical deterioration. Furthermore, it is anticipated that the patient will be medically stable for discharge from the hospital within 2 midnights of admission.        Date of Service 10/17/2023    Lorretta Harp [facility]   If 7PM-7AM, please contact night-coverage [email or web address] 10/17/2023, 9:08 PM

## 2023-10-17 NOTE — ED Provider Triage Note (Signed)
Emergency Medicine Provider Triage Evaluation Note  Marshel Rolnick , a 62 y.o. male  was evaluated in triage.  Pt complains of emesis for a while. Was seen last month, has follow up with GI but not till march. PMH of stomach cancer.  Review of Systems  Positive: Vomiting, diarrhea, night sweats Negative:   Physical Exam  Ht 5\' 10"  (1.778 m)   BMI 24.22 kg/m  Gen:   Awake, no distress   Resp:  Normal effort  MSK:   Moves extremities without difficulty  Other:    Medical Decision Making  Medically screening exam initiated at 9:40 AM.  Appropriate orders placed.  Richmond Bounds was informed that the remainder of the evaluation will be completed by another provider, this initial triage assessment does not replace that evaluation, and the importance of remaining in the ED until their evaluation is complete.     Cameron Ali, PA-C 10/17/23 [phone]

## 2023-10-17 NOTE — ED Provider Notes (Signed)
[facility] Provider Note    Event Date/Time   First MD Initiated Contact with Patient 10/17/23 1511     (approximate)   History   Emesis   HPI  Kevin Esparza is a 62 y.o. male with a history of stomach cancer smoking history remote history of CVA presents to the ER for evaluation of several weeks of intractable nausea and vomiting.  Feels like he is not able to keep anything down and feels like it gets into his stomach and then shortly thereafter he vomits it back up.  He does endorse cramping abdominal pain.     Physical Exam   Triage Vital Signs: ED Triage Vitals  Encounter Vitals Group     BP 10/17/23 0941 (!) 152/73     Systolic BP Percentile --      Diastolic BP Percentile --      Pulse Rate 10/17/23 0941 95     Resp 10/17/23 0941 16     Temp 10/17/23 0941 98.9 F (37.2 C)     Temp Source 10/17/23 0941 Oral     SpO2 10/17/23 0941 98 %     Weight --      Height 10/17/23 0940 5\' 10"  (1.778 m)     Head Circumference --      Peak Flow --      Pain Score 10/17/23 0939 0     Pain Loc --      Pain Education --      Exclude from Growth Chart --     Most recent vital signs: Vitals:   10/17/23 1700 10/17/23 1912  BP: 123/64   Pulse: 83   Resp: 12   Temp:  98.3 F (36.8 C)  SpO2: 100%      Constitutional: Alert  Eyes: Conjunctivae are normal.  Head: Atraumatic. Nose: No congestion/rhinnorhea. Mouth/Throat: Mucous membranes are moist.   Neck: Painless ROM.  Cardiovascular:   Good peripheral circulation. Respiratory: Normal respiratory effort.  No retractions.  Gastrointestinal: Soft and nontender.  Musculoskeletal:  no deformity Neurologic:  MAE spontaneously. No gross focal neurologic deficits are appreciated.  Skin:  Skin is warm, dry and intact. No rash noted. Psychiatric: Mood and affect are normal. Speech and behavior are normal.    ED Results / Procedures / Treatments   Labs (all labs ordered are listed, but only  abnormal results are displayed) Labs Reviewed  COMPREHENSIVE METABOLIC PANEL - Abnormal; Notable for the following components:      Result Value   Potassium 2.1 (*)    Chloride 95 (*)    Glucose, Bld 150 (*)    BUN <5 (*)    Calcium 8.5 (*)    Total Protein 6.1 (*)    Albumin 3.0 (*)    All other components within normal limits  CBC WITH DIFFERENTIAL/PLATELET - Abnormal; Notable for the following components:   HCT 38.7 (*)    MCV 78.2 (*)    MCHC 36.7 (*)    All other components within normal limits  URINALYSIS, ROUTINE W REFLEX MICROSCOPIC - Abnormal; Notable for the following components:   Color, Urine YELLOW (*)    APPearance HAZY (*)    Protein, ur 30 (*)    All other components within normal limits  PHOSPHORUS - Abnormal; Notable for the following components:   Phosphorus 2.0 (*)    All other components within normal limits  LIPASE, BLOOD  MAGNESIUM  MAGNESIUM  BRAIN NATRIURETIC PEPTIDE  PROCALCITONIN  EKG  ED ECG REPORT I, Willy Eddy, the attending physician, personally viewed and interpreted this ECG.   Date: 10/17/2023  EKG Time: 19:00  Rate: 90  Rhythm: sinus  Axis: normal  Intervals: normal  ST&T Change: no stemi, nonspecific st abn    RADIOLOGY Please see ED Course for my review and interpretation.  I personally reviewed all radiographic images ordered to evaluate for the above acute complaints and reviewed radiology reports and findings.  These findings were personally discussed with the patient.  Please see medical record for radiology report.    PROCEDURES:  Critical Care performed: Yes, see critical care procedure note(s)  .Critical Care  Performed by: Willy Eddy, MD Authorized by: Willy Eddy, MD   Critical care provider statement:    Critical care was necessary to treat or prevent imminent or life-threatening deterioration of the following conditions:  Metabolic crisis   Critical care was time spent personally by  me on the following activities:  Ordering and performing treatments and interventions, ordering and review of laboratory studies, ordering and review of radiographic studies, pulse oximetry, re-evaluation of patient's condition, review of old charts, obtaining history from patient or surrogate, examination of patient, evaluation of patient's response to treatment, discussions with primary provider, discussions with consultants and development of treatment plan with patient or surrogate    MEDICATIONS ORDERED IN ED: Medications  potassium chloride SA (KLOR-CON M) CR tablet 40 mEq (40 mEq Oral Patient Refused/Not Given 10/17/23 1526)  ondansetron (ZOFRAN) injection 4 mg (has no administration in time range)  hydrALAZINE (APRESOLINE) injection 5 mg (has no administration in time range)  acetaminophen (TYLENOL) tablet 650 mg (has no administration in time range)  morphine (PF) 2 MG/ML injection 2 mg (has no administration in time range)  albuterol (PROVENTIL) (2.5 MG/3ML) 0.083% nebulizer solution 2.5 mg (has no administration in time range)  insulin aspart (novoLOG) injection 0-9 Units (has no administration in time range)  insulin aspart (novoLOG) injection 0-5 Units (has no administration in time range)  magnesium sulfate IVPB 2 g 50 mL (0 g Intravenous Stopped 10/17/23 1816)  potassium chloride 10 mEq in 100 mL IVPB (0 mEq Intravenous Stopped 10/17/23 1657)  pantoprazole (PROTONIX) injection 40 mg (40 mg Intravenous Given 10/17/23 1659)  iohexol (OMNIPAQUE) 300 MG/ML solution 100 mL (100 mLs Intravenous Contrast Given 10/17/23 1627)     IMPRESSION / MDM / ASSESSMENT AND PLAN / ED COURSE  I reviewed the triage vital signs and the nursing notes.                              Differential diagnosis includes, but is not limited to, electrolyte abnormality, GOL O, pancreatitis, enteritis, mass  Patient presenting to the ER for evaluation of symptoms as described above.  Based on symptoms, risk  factors and considered above differential, this presenting complaint could reflect a potentially life-threatening illness therefore the patient will be placed on continuous pulse oximetry and telemetry for monitoring.  Laboratory evaluation will be sent to evaluate for the above complaints.      Clinical Course as of 10/17/23 1932  Mon Oct 17, 2023  1643 CT on my review and interpretation without evidence of perforation.  Will await formal radiology report. [PR]    Clinical Course User Index [PR] Willy Eddy, MD   CT imaging without evidence of obstruction.  He is denying any diarrhea therefore have a lower suspicion for colitis particular given duration of  symptoms.  Given his significant hypokalemia I do feel he will require hospitalization.  FINAL CLINICAL IMPRESSION(S) / ED DIAGNOSES   Final diagnoses:  Intractable nausea and vomiting  Hypokalemia     Rx / DC Orders   ED Discharge Orders     None        Note:  This document was prepared using Dragon voice recognition software and may include unintentional dictation errors.    Willy Eddy, MD 10/17/23 [phone]

## 2023-10-17 NOTE — ED Provider Notes (Signed)
Significant ED boarding/wait times.  Potassium noted 2.1.  I have ordered oral potassium to be administered if patient can tolerate oral medication at this time as we continue to await a bed for further workup/evaluation   Sharyn Creamer, MD 10/17/23 1057

## 2023-10-17 NOTE — Progress Notes (Signed)
Pharmacy Antibiotic Note  Kevin Esparza is a 62 y.o. male admitted on 10/17/2023 with  colitis .  Past medical history includes stomach cancer, smoking, and a remote history of CVA. Upon presentation, patient reported intractable nausea and vomiting. He has not been able to keep anything down. Also reports cramping and abdominal pain. Pharmacy has been consulted for Zosyn dosing.  WBC WNL Afebrile Scr at baseline CT a/p: Colon is decompressed which accentuates wall thickness, but the wall of the cecum and ascending colon is ill-defined raising the question of mural edema. While not definite, imaging features could be related to right-sided colitis  Plan: Start Zosyn 3.375 g IV Q8H Continue to monitor renal function and follow culture results   Height: 5\' 10"  (177.8 cm) IBW/kg (Calculated) : 73  Temp (24hrs), Avg:98.4 F (36.9 C), Min:98 F (36.7 C), Max:98.9 F (37.2 C)  Recent Labs  Lab 10/17/23 0942  WBC 6.5  CREATININE 0.79    CrCl cannot be calculated (Unknown ideal weight.).    No Known Allergies  Antimicrobials this admission: 12/16 Zosyn >>     Dose adjustments this admission: None  Microbiology results: None  Thank you for allowing pharmacy to be a part of this patient's care.  Merryl Hacker, PharmD Clinical Pharmacist 10/17/2023 7:52 PM

## 2023-10-18 DIAGNOSIS — T182XXA Foreign body in stomach, initial encounter: Secondary | ICD-10-CM | POA: Diagnosis present

## 2023-10-18 DIAGNOSIS — I110 Hypertensive heart disease with heart failure: Secondary | ICD-10-CM | POA: Diagnosis present

## 2023-10-18 DIAGNOSIS — Z9689 Presence of other specified functional implants: Secondary | ICD-10-CM | POA: Diagnosis present

## 2023-10-18 DIAGNOSIS — D120 Benign neoplasm of cecum: Secondary | ICD-10-CM | POA: Diagnosis present

## 2023-10-18 DIAGNOSIS — M109 Gout, unspecified: Secondary | ICD-10-CM | POA: Diagnosis present

## 2023-10-18 DIAGNOSIS — Z7901 Long term (current) use of anticoagulants: Secondary | ICD-10-CM | POA: Diagnosis not present

## 2023-10-18 DIAGNOSIS — R197 Diarrhea, unspecified: Secondary | ICD-10-CM

## 2023-10-18 DIAGNOSIS — K219 Gastro-esophageal reflux disease without esophagitis: Secondary | ICD-10-CM | POA: Diagnosis present

## 2023-10-18 DIAGNOSIS — N400 Enlarged prostate without lower urinary tract symptoms: Secondary | ICD-10-CM | POA: Diagnosis present

## 2023-10-18 DIAGNOSIS — E119 Type 2 diabetes mellitus without complications: Secondary | ICD-10-CM | POA: Diagnosis present

## 2023-10-18 DIAGNOSIS — D122 Benign neoplasm of ascending colon: Secondary | ICD-10-CM | POA: Diagnosis present

## 2023-10-18 DIAGNOSIS — Z8249 Family history of ischemic heart disease and other diseases of the circulatory system: Secondary | ICD-10-CM | POA: Diagnosis not present

## 2023-10-18 DIAGNOSIS — I4891 Unspecified atrial fibrillation: Secondary | ICD-10-CM | POA: Diagnosis present

## 2023-10-18 DIAGNOSIS — R112 Nausea with vomiting, unspecified: Secondary | ICD-10-CM | POA: Diagnosis present

## 2023-10-18 DIAGNOSIS — D123 Benign neoplasm of transverse colon: Secondary | ICD-10-CM | POA: Diagnosis present

## 2023-10-18 DIAGNOSIS — Z7984 Long term (current) use of oral hypoglycemic drugs: Secondary | ICD-10-CM | POA: Diagnosis not present

## 2023-10-18 DIAGNOSIS — E785 Hyperlipidemia, unspecified: Secondary | ICD-10-CM | POA: Diagnosis present

## 2023-10-18 DIAGNOSIS — E440 Moderate protein-calorie malnutrition: Secondary | ICD-10-CM | POA: Diagnosis present

## 2023-10-18 DIAGNOSIS — M25561 Pain in right knee: Secondary | ICD-10-CM | POA: Diagnosis present

## 2023-10-18 DIAGNOSIS — F17220 Nicotine dependence, chewing tobacco, uncomplicated: Secondary | ICD-10-CM | POA: Diagnosis present

## 2023-10-18 DIAGNOSIS — C7A8 Other malignant neuroendocrine tumors: Secondary | ICD-10-CM | POA: Diagnosis not present

## 2023-10-18 DIAGNOSIS — K222 Esophageal obstruction: Secondary | ICD-10-CM | POA: Diagnosis present

## 2023-10-18 DIAGNOSIS — E876 Hypokalemia: Secondary | ICD-10-CM | POA: Diagnosis present

## 2023-10-18 DIAGNOSIS — D3A8 Other benign neuroendocrine tumors: Secondary | ICD-10-CM | POA: Diagnosis present

## 2023-10-18 DIAGNOSIS — W449XXA Unspecified foreign body entering into or through a natural orifice, initial encounter: Secondary | ICD-10-CM | POA: Diagnosis present

## 2023-10-18 DIAGNOSIS — I5032 Chronic diastolic (congestive) heart failure: Secondary | ICD-10-CM | POA: Diagnosis present

## 2023-10-18 DIAGNOSIS — Z8616 Personal history of COVID-19: Secondary | ICD-10-CM | POA: Diagnosis not present

## 2023-10-18 DIAGNOSIS — I10 Essential (primary) hypertension: Secondary | ICD-10-CM | POA: Diagnosis not present

## 2023-10-18 LAB — GLUCOSE, CAPILLARY
Glucose-Capillary: 118 mg/dL — ABNORMAL HIGH (ref 70–99)
Glucose-Capillary: 140 mg/dL — ABNORMAL HIGH (ref 70–99)

## 2023-10-18 LAB — GASTROINTESTINAL PANEL BY PCR, STOOL (REPLACES STOOL CULTURE)

## 2023-10-18 LAB — C DIFFICILE QUICK SCREEN W PCR REFLEX
C Diff antigen: NEGATIVE
C Diff interpretation: NOT DETECTED
C Diff toxin: NEGATIVE

## 2023-10-18 LAB — CBC
HCT: 31.2 % — ABNORMAL LOW (ref 39.0–52.0)
Hemoglobin: 11.3 g/dL — ABNORMAL LOW (ref 13.0–17.0)
MCH: 28.7 pg (ref 26.0–34.0)
MCHC: 36.2 g/dL — ABNORMAL HIGH (ref 30.0–36.0)
MCV: 79.2 fL — ABNORMAL LOW (ref 80.0–100.0)
Platelets: 211 10*3/uL (ref 150–400)
RBC: 3.94 MIL/uL — ABNORMAL LOW (ref 4.22–5.81)
RDW: 13.3 % (ref 11.5–15.5)
WBC: 5 10*3/uL (ref 4.0–10.5)
nRBC: 0 % (ref 0.0–0.2)

## 2023-10-18 LAB — CBG MONITORING, ED
Glucose-Capillary: 113 mg/dL — ABNORMAL HIGH (ref 70–99)
Glucose-Capillary: 148 mg/dL — ABNORMAL HIGH (ref 70–99)

## 2023-10-18 LAB — PROTIME-INR
INR: 1.2 (ref 0.8–1.2)
Prothrombin Time: 15.6 s — ABNORMAL HIGH (ref 11.4–15.2)

## 2023-10-18 LAB — PHOSPHORUS: Phosphorus: 4 mg/dL (ref 2.5–4.6)

## 2023-10-18 LAB — MAGNESIUM: Magnesium: 1.8 mg/dL (ref 1.7–2.4)

## 2023-10-18 LAB — HIV ANTIBODY (ROUTINE TESTING W REFLEX): HIV Screen 4th Generation wRfx: NONREACTIVE

## 2023-10-18 LAB — APTT: aPTT: 32 s (ref 24–36)

## 2023-10-18 LAB — POTASSIUM: Potassium: 2.2 mmol/L — CL (ref 3.5–5.1)

## 2023-10-18 MED ORDER — POTASSIUM CHLORIDE 10 MEQ/100ML IV SOLN
10.0000 meq | INTRAVENOUS | Status: AC
  Administered 2023-10-18 – 2023-10-19 (×6): 10 meq via INTRAVENOUS
  Filled 2023-10-18 (×6): qty 100

## 2023-10-18 MED ORDER — ENSURE ENLIVE PO LIQD: 237.0000 mL | Freq: Two times a day (BID) | ORAL | Status: DC

## 2023-10-18 MED ORDER — POTASSIUM CHLORIDE CRYS ER 20 MEQ PO TBCR
40.0000 meq | EXTENDED_RELEASE_TABLET | Freq: Once | ORAL | Status: AC
  Administered 2023-10-18: 40 meq via ORAL
  Filled 2023-10-18: qty 2

## 2023-10-18 MED ORDER — POTASSIUM CHLORIDE 10 MEQ/100ML IV SOLN
10.0000 meq | INTRAVENOUS | Status: AC
Start: 2023-10-18 — End: 2023-10-18
  Administered 2023-10-18 (×4): 10 meq via INTRAVENOUS
  Filled 2023-10-18 (×2): qty 100

## 2023-10-18 MED ORDER — MAGNESIUM SULFATE 2 GM/50ML IV SOLN
2.0000 g | Freq: Once | INTRAVENOUS | Status: AC
  Administered 2023-10-18: 2 g via INTRAVENOUS
  Filled 2023-10-18: qty 50

## 2023-10-18 NOTE — Progress Notes (Signed)
1      PROGRESS NOTE    Kevin Esparza  WUJ:[id number] DOB: 01-25-1961 DOA: 10/17/2023 PCP: Shane Crutch, PA    Brief Narrative:   62 y.o. male with medical history significant of stomach neuroendocrine carcinoma, HTN, HLD, DM,  dCHF, stroke, gout, BPH, A-fib on Eliquis, is admitted for nausea, vomiting, diarrhea   12/17: GI consult  Assessment & Plan:   Principal Problem:   Nausea vomiting and diarrhea Active Problems:   Colitis   HTN (hypertension)   Hyperlipidemia   Atrial fibrillation (HCC)   Diabetes mellitus without complication (HCC)   Chronic diastolic CHF (congestive heart failure) (HCC)   Hypokalemia   Hypophosphatemia   BPH (benign prostatic hyperplasia)   Gout   Neuroendocrine carcinoma of stomach (HCC)   Nausea vomiting and diarrhea: Likely due to rectal colitis as evidenced by CT scan.  No fever or leukocytosis.  Clinically not septic.  Patient has history of stomach neuroendocrine carcinoma. - consult GI for possible EGD. Patient reports on and off diarrhea for over 20 years with no conclusive diagnosis.  He has been following with GI and has had multiple endoscopies per him -IV Zosyn -IV fluid: Normal saline 75 cc/h -Liquid diet now -As needed Zofran for nausea vomiting -Pending C. difficile and GI pathogen panel -Procalcitonin less than 0.1   Possible colitis: As evidenced by CT scan -Continue IV Zosyn for now.  GI consult   HTN (hypertension): Blood pressure 123/64 on admission -IV hydralazine as needed -Hold enalapril since blood pressures is normal and also because patient is clinically dry. -Continue amlodipine and metoprolol   Hyperlipidemia -Lipitor   Atrial fibrillation (HCC): Heart rate 80-90s -Metoprolol -Eliquis   Diabetes mellitus without complication (HCC): Recent A1c 6.0, well-controlled.  Patient is taking metformin -Sliding scale insulin   Chronic diastolic CHF (congestive heart failure) (HCC): 2D echo 07/27/2022 showed EF  of 60 to 65%.  Patient is clinically dry. - BNP 23   Hypokalemia and Hypophosphatemia: Potassium 2.1, magnesium 1.8 -Repleted both -Follow-up lab pneumonia   BPH (benign prostatic hyperplasia) -Flomax   Gout -Continue home as needed colchicine   History of neuroendocrine carcinoma of stomach (HCC): Patient had EGD on 05/13/2022 which showed no gross lesion in esophagus, a nonobstructive Schatzki's ring and left gastric esophageal junction and hiatal hernia. - consult GI for possible EGD       DVT prophylaxis: Eliquis  apixaban (ELIQUIS) tablet 5 mg     Code Status: Full code Family Communication: Updated wife at bedside Disposition Plan: Possible discharge in next 2 to 3 days depending on clinical condition and GI workup   Consultants:  GI    Antimicrobials:  Zosyn IV   Subjective:  Patient reports ongoing diarrhea, no blood.  Has not had any nausea and vomiting while here.  Hoping to have a GI evaluation while here, wife at bedside  Objective: Vitals:   10/18/23 0840 10/18/23 0850 10/18/23 0900 10/18/23 0930  BP: (!) 126/58  130/70 138/62  Pulse:   83 80  Resp:   18 16  Temp:  98.1 F (36.7 C)    TempSrc:  Oral    SpO2:   94% 99%  Weight:      Height:        Intake/Output Summary (Last 24 hours) at 10/18/2023 1223 Last data filed at 10/18/2023 1027 Gross per 24 hour  Intake 657.42 ml  Output --  Net 657.42 ml   Filed Weights   10/17/23 2000 10/17/23 2014  Weight: 68 kg 68 kg    Examination:  General exam: Appears calm and comfortable  Respiratory system: Clear to auscultation. Respiratory effort normal. Cardiovascular system: S1 & S2 heard, RRR. No JVD, murmurs, rubs, gallops or clicks. No pedal edema. Gastrointestinal system: Abdomen is nondistended, soft and nontender. No organomegaly or masses felt. Normal bowel sounds heard. Central nervous system: Alert and oriented. No focal neurological deficits. Extremities: Symmetric 5 x 5  power. Skin: No rashes, lesions or ulcers Psychiatry: Judgement and insight appear normal. Mood & affect appropriate.     Data Reviewed: I have personally reviewed following labs and imaging studies  CBC: Recent Labs  Lab 10/17/23 0942 10/18/23 0459  WBC 6.5 5.0  NEUTROABS 3.6  --   HGB 14.2 11.3*  HCT 38.7* 31.2*  MCV 78.2* 79.2*  PLT 257 211   Basic Metabolic Panel: Recent Labs  Lab 10/17/23 0942 10/17/23 1527 10/18/23 0459  NA 136  --  135  K 2.1*  --  <2.0*  CL 95*  --  99  CO2 31  --  27  GLUCOSE 150*  --  107*  BUN <5*  --  <5*  CREATININE 0.79  --  0.73  CALCIUM 8.5*  --  7.3*  MG 2.0 1.8 1.8  PHOS  --  2.0* 4.0   GFR: Estimated Creatinine Clearance: 90.9 mL/min (by C-G formula based on SCr of 0.73 mg/dL). Liver Function Tests: Recent Labs  Lab 10/17/23 0942  AST 20  ALT 12  ALKPHOS 72  BILITOT 1.1  PROT 6.1*  ALBUMIN 3.0*   Recent Labs  Lab 10/17/23 0942  LIPASE 27   No results for input(s): "AMMONIA" in the last 168 hours. Coagulation Profile: Recent Labs  Lab 10/18/23 0845  INR 1.2   Cardiac Enzymes: No results for input(s): "CKTOTAL", "CKMB", "CKMBINDEX", "TROPONINI" in the last 168 hours. BNP (last 3 results) No results for input(s): "PROBNP" in the last 8760 hours. HbA1C: No results for input(s): "HGBA1C" in the last 72 hours. CBG: Recent Labs  Lab 10/17/23 2212 10/18/23 0732 10/18/23 1204  GLUCAP 157* 113* 148*   Lipid Profile: No results for input(s): "CHOL", "HDL", "LDLCALC", "TRIG", "CHOLHDL", "LDLDIRECT" in the last 72 hours. Thyroid Function Tests: No results for input(s): "TSH", "T4TOTAL", "FREET4", "T3FREE", "THYROIDAB" in the last 72 hours. Anemia Panel: No results for input(s): "VITAMINB12", "FOLATE", "FERRITIN", "TIBC", "IRON", "RETICCTPCT" in the last 72 hours. Sepsis Labs: Recent Labs  Lab 10/17/23 1527  PROCALCITON <0.10    No results found for this or any previous visit (from the past 240 hours).        Radiology Studies: CT ABDOMEN PELVIS W CONTRAST Result Date: 10/17/2023 CLINICAL DATA:  Vomiting for 1 month. Clinical concern for bowel obstruction. EXAM: CT ABDOMEN AND PELVIS WITH CONTRAST TECHNIQUE: Multidetector CT imaging of the abdomen and pelvis was performed using the standard protocol following bolus administration of intravenous contrast. RADIATION DOSE REDUCTION: This exam was performed according to the departmental dose-optimization program which includes automated exposure control, adjustment of the mA and/or kV according to patient size and/or use of iterative reconstruction technique. CONTRAST:  OMNIPAQUE IOHEXOL 300 MG/ML  SOLN COMPARISON:  09/17/2023 FINDINGS: Lower chest: Unremarkable. Hepatobiliary: No suspicious focal abnormality within the liver parenchyma. Cholecystectomy. No intrahepatic or extrahepatic biliary dilation. Pancreas: No focal mass lesion. No dilatation of the main duct. No intraparenchymal cyst. No peripancreatic edema. Spleen: No splenomegaly. No suspicious focal mass lesion. Adrenals/Urinary Tract: Right adrenal gland unremarkable. Stable 10 mm  left adrenal nodule also unchanged when comparing back to a study from 02/07/2007, consistent with benign etiology such as adenoma. No followup imaging is recommended. Tiny well-defined homogeneous low-density lesions in both kidneys are too small to characterize but are statistically most likely benign and probably cysts. No followup imaging is recommended. No evidence for hydroureter. The urinary bladder appears normal for the degree of distention. Stomach/Bowel: Stomach is unremarkable. No gastric wall thickening. No evidence of outlet obstruction. Duodenum is normally positioned as is the ligament of Treitz. No small bowel wall thickening. No small bowel dilatation. The terminal ileum is normal. The appendix is not well visualized, but there is no edema or inflammation in the region of the cecal tip to suggest  appendicitis. Colon is decompressed which accentuates wall thickness, but the wall of the cecum and ascending colon is ill-defined raising the question of mural edema. Vascular/Lymphatic: There is moderate atherosclerotic calcification of the abdominal aorta without aneurysm. There is no gastrohepatic or hepatoduodenal ligament lymphadenopathy. No retroperitoneal or mesenteric lymphadenopathy. No pelvic sidewall lymphadenopathy. Reproductive: Prostate gland is enlarged. Other: No intraperitoneal free fluid. Musculoskeletal: Stable sclerotic focus right iliac crest, likely a bone island IMPRESSION: 1. Colon is decompressed which accentuates wall thickness, but the wall of the cecum and ascending colon is ill-defined raising the question of mural edema. While not definite, imaging features could be related to right-sided colitis. 2. No evidence for small bowel obstruction. 3. Prostatomegaly. 4.  Aortic Atherosclerosis (ICD10-I70.0). Electronically Signed   By: Kennith Center M.D.   On: 10/17/2023 18:18        Scheduled Meds:  amLODipine  5 mg Oral Daily   apixaban  5 mg Oral BID   insulin aspart  0-5 Units Subcutaneous QHS   insulin aspart  0-9 Units Subcutaneous TID WC   metoprolol succinate  25 mg Oral QPM   tamsulosin  0.4 mg Oral QPC breakfast   tiZANidine  4 mg Oral QHS   Continuous Infusions:  sodium chloride 75 mL/hr at 10/18/23 0981   magnesium sulfate bolus IVPB     piperacillin-tazobactam (ZOSYN)  IV Stopped (10/18/23 0926)   potassium chloride 10 mEq (10/18/23 1132)     LOS: 0 days    Time spent: 35 minutes    Dalene Robards Sherryll Burger, MD [facility] Pager [phone]  If 7PM-7AM, please contact night-coverage [email or web address] Password [facility] 10/18/2023, 12:23 PM

## 2023-10-18 NOTE — Progress Notes (Addendum)
PHARMACY CONSULT NOTE - ELECTROLYTES  Pharmacy Consult for Electrolyte Monitoring and Replacement   Recent Labs: Height: 5\' 10"  (177.8 cm) Weight: 68 kg (150 lb) IBW/kg (Calculated) : 73 Estimated Creatinine Clearance: 90.9 mL/min (by C-G formula based on SCr of 0.73 mg/dL).  Potassium (mmol/L)  Date Value  10/18/2023 2.2 (LL)  04/20/2014 4.0   Magnesium (mg/dL)  Date Value  40/08/2724 1.8   Calcium (mg/dL)  Date Value  36/64/4034 7.3 (L)   Calcium, Total (mg/dL)  Date Value  74/25/9563 8.9   Albumin (g/dL)  Date Value  87/56/4332 3.0 (L)   Phosphorus (mg/dL)  Date Value  95/18/8416 4.0   Sodium (mmol/L)  Date Value  10/18/2023 135  04/20/2014 134 (L)   Corrected Ca: 8.1 mg/dL  Assessment  Kevin Esparza is a 62 y.o. male presenting with N/V/D/dysuria.N/V x 6 weeks, ?rectal colitis. PMH significant for  stomach neuroendocrine carcinoma, HTN, HLD, DM,  dCHF, stroke, gout, BPH, A-fib on Eliquis. Pharmacy has been consulted to monitor and replace electrolytes.  Diet: Full liquid MIVF: NS @ 75 mL/hr Pertinent medications: none  Goal of Therapy: Electrolytes WNL  Plan:  K < 2.0; KCL 10 mEq IV x 4 and KCL 40 mEq PO x 1 F/u K+ at 1500 Mg 1.8; magnesium 2 gm IV x 1   Addendum:  Repeat K+ 1607 = 2.2  Will order an additional Kcl 10 mEq IV x 6  Will f/u K+ on 12/18 0200  Thank you for allowing pharmacy to be a part of this patient's care.  Littie Deeds, PharmD Pharmacy Resident  10/18/2023 11:16 AM

## 2023-10-18 NOTE — ED Notes (Signed)
Dr. Shah at bedside.

## 2023-10-18 NOTE — Progress Notes (Signed)
Pt received PNA vaccine 09/19/23

## 2023-10-18 NOTE — Consult Note (Addendum)
Consultation  Referring Provider:    Dr Clyde Lundborg  Admit date 10/17/23 Consult date        10/18/23 Reason for Consultation:   NVD           HPI:   Kevin Esparza is a 62 y.o. male with medical history significant of stomach neuroendocrine carcinoma, HTN, HLD, DM,  dCHF, stroke, gout, BPH, A-fib on Eliquis, who presented to ED yesterday with nausea, vomiting, diarrhea.Cdiff negative, stool pcr still processing. Initial hemoglobin normal. He was very hypokalemic. Gfr normal. He has been started on zosyn for possible colitis.  He is anticoagulated on eliquis. Note he has been on ppi/h2ra as o/p. Note he was seen last month for NVD x 3d- treated with fluids and antiemetics, electroyte imbalances and discharged for follow up care. Patient reports that he was to have colonoscopy last October but became sick with nausea so did not have it done. In chart review he was also have trouble with solid food dysphagia (ssen by o/p GI 04/14/23) and egd/colon arranged. Since October he has been having problems with hot flashes, night sweats, diarrhea and dyspepsia. States if he eats meats or bread they stick in his stomach and then there is spontaneous regurgitation. Fruit/veg are ok. There is dysphagia in the mid chest with liquids but not solids. Around 10 diarrheal stools/d. No nocturnal diarrhea. Denies melena/hematochezia. Denies abdominal pain. Feels bloated sometimes. Has had a 30lb weight loss in the last month  denies any lymphadenopathy.  Had some lumps on head but they have improved. 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 I am unsure when his last surg- onc appt was but it seems there was no indication for surgery from other notes I have seen.  Had CT A/p yesterday - IMPRESSION: 1. Colon is decompressed which accentuates wall thickness, but the wall  of the cecum and ascending colon is ill-defined raising the question of mural edema. While not definite, imaging features could be related to right-sided colitis. 2. No evidence for small bowel obstruction. 3. Prostatomegaly. 4.  Aortic Atherosclerosis (ICD10-I70.0).   CT A/P last month unremarkable  Chromogranin A level 65.07 February 2021 - he has been on omeprazole and pepcid until a few days ago  PREVIOUS ENDOSCOPIES:            EGD 05/13/22- Dr Meridee Score- - No gross lesions in esophagus. Non- obstructing Schatzki ring. Z- line regular, 39 cm from the incisors. - 3 cm hiatal hernia. - Texture changed mucosa in the greater curvature of the distal gastric body. Removed with mucosal resection. Clip ( MR conditional) was placed. Tattooed laterally. - Nodule in the gastric antrum. Removed with snare polypectomy. - Erythematous mucosa in the stomach. Biopsied. - No gross lesions in the duodenal bulb, in the first portion of the duodenum and in the second portion of the duodenum. Path: A. STOMACH, ANTRAL NODULE, BIOPSY:  Benign hyperplastic gastric polyp.  Negative for dysplasia and malignancy.  B. STOMACH, GREATER CURVE/ DISTAL BODY, BIOPSY:  Polypoid fragments of gastric mucosa with moderate vascular ectasia and  lymphoid aggregates in the lamina propria.  Negative for dysplasia and malignancy.  No submucosal lesion is identified.  C. STOMACH, RANDOM, BIOPSY:  Fragments of gastric mucosa with no significant microscopic  abnormalities.  H.pylori, intestinal metaplasia, atrophy and dysplasia are not  identified  Colonoscopy 05/13/22-  Dr Sharol Roussel- 11 3-63mm polyps removed- these were all adenomas and advanced adenomas Hemorrhoids, normal colon otherwise  Colonoscopy 5/22- Dr Sharol Roussel- large 25mm polyp removed with mucosal resection and several other polyps removed from transverse colon and cecum. Hemorrhoids. Normal mucosa in colon otherwise all polyps adenomatous without high grade  dysplasia/malignancy  Upper EUS 5/22- Dr Meridee Score- patent schatzki ring, food in stomach, nodular mucosa at lesser curve- not clearly same lesion that was biopsied and returned as NET- area biopsies. Small lesion at incisura. No lesions in duodenum and no stomach wall thickening on [place]. There were no malignant appearing lymph nodes.Path: A: Stomach, lesser curve/antrum, biopsy - Gastric antral type mucosa with foveolar hyperplasia, consistent with reactive gastropathy - No Helicobacter pylori identified on H+E stain - No dysplasia or carcinoma identified   B: Stomach, lesser curve/body, biopsy - Gastric oxyntic mucosa with no significant pathologic abnormality - No Helicobacter pylori identified on H+E stain - No dysplasia or carcinoma identified  Colonoscopy 3/22- Dr Maximino Greenland- large cecal polyp, large ascending colon polyps and large transverse  and descending colon polyps and smaller left sided polyps. All adenomatous  EGD 3/22- Dr Maximino Greenland- mild schatzki ring that was dilated, gastritis, submucosal papule at the incisura and at the lesser curvature of the stomach, normal duodenum. Path:A.  STOMACH; COLD BIOPSY: - ANTRAL MUCOSA WITH REACTIVE AND FOCAL MILD CHRONIC GASTRITIS, NON-SPECIFIC. - IHC FOR H.PYLORI IS NEGATIVE. - NEGATIVE FOR DYSPLASIA AND MALIGNANCY.  B.  STOMACH, INCISURA; COLD BIOPSY: - FOCAL MILD CHRONIC GASTRITIS, NON-SPECIFIC. - NEGATIVE FOR H. PYLORI, DYSPLASIA, AND MALIGNANCY.  C.  STOMACH, POLYPOID AREA; COLD BIOPSY: - OXYNTIC MUCOSA WITH FOCAL DENSE AREA OF CHRONIC GASTRITIS INVOLVING THE DEEP ASPECT OF THE MUCOSA, SEE COMMENT. - IHC FOR H.PYLORI IS NEGATIVE. - NEGATIVE FOR DYSPLASIA AND MALIGNANCY.  D.  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 - SEE COMMENT.  E.  ESOPHAGUS; COLD BIOPSY: - SQUAMOUS MUCOSA WITH CHANGES CONSISTENT WITH REFLUX ESOPHAGITIS.  - NEGATIVE FOR DYSPLASIA  AND MALIGNANCY.    Past Medical History:  Diagnosis Date   Anxiety    Arthritis    knees, hands   CHF (congestive heart failure) (HCC)    COVID    Diabetes mellitus without complication (HCC)    type 2   ED (erectile dysfunction)    Elevated PSA    GERD (gastroesophageal reflux disease)    Heartburn    History of BPH    HLD (hyperlipidemia)    HTN (hypertension)    Nausea & vomiting 10/17/2023   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   Pneumonia    several times   Stomach cancer (HCC)    Stroke [facility])     Past Surgical History:  Procedure Laterality Date   APPENDECTOMY  2005   BIOPSY  03/19/2021   Procedure: BIOPSY;  Surgeon: Lemar Lofty., MD;  Location: [facility] ENDOSCOPY;  Service: Gastroenterology;;   BIOPSY  05/13/2022   Procedure: BIOPSY;  Surgeon: Lemar Lofty., MD;  Location: [facility] ENDOSCOPY;  Service: Gastroenterology;;   CERVICAL SPINE SURGERY  1997   c4-c7   CHOLECYSTECTOMY  2005   COLONOSCOPY WITH PROPOFOL N/A 12/31/2020   Procedure: COLONOSCOPY WITH PROPOFOL;  Surgeon: Pasty Spillers, MD;  Location: [facility] ENDOSCOPY;  Service: Endoscopy;  Laterality: N/A;   COLONOSCOPY WITH PROPOFOL N/A 03/19/2021   Procedure: COLONOSCOPY WITH PROPOFOL;  Surgeon: Meridee Score Netty Starring., MD;  Location: [facility] ENDOSCOPY;  Service: Gastroenterology;  Laterality: N/A;  COLONOSCOPY WITH PROPOFOL N/A 05/13/2022   Procedure: COLONOSCOPY WITH PROPOFOL;  Surgeon: Meridee Score Netty Starring., MD;  Location: [facility] ENDOSCOPY;  Service: Gastroenterology;  Laterality: N/A;   ENDOSCOPIC MUCOSAL RESECTION  03/19/2021   Procedure: ENDOSCOPIC MUCOSAL RESECTION;  Surgeon: Meridee Score Netty Starring., MD;  Location: [facility] ENDOSCOPY;  Service: Gastroenterology;;   ENDOSCOPIC MUCOSAL RESECTION N/A 05/13/2022   Procedure: ENDOSCOPIC MUCOSAL RESECTION;  Surgeon: Lemar Lofty., MD;  Location: [facility] ENDOSCOPY;  Service: Gastroenterology;  Laterality: N/A;   ESOPHAGOGASTRODUODENOSCOPY  (EGD) WITH PROPOFOL N/A 12/31/2020   Procedure: ESOPHAGOGASTRODUODENOSCOPY (EGD) WITH PROPOFOL;  Surgeon: Pasty Spillers, MD;  Location: [facility] ENDOSCOPY;  Service: Endoscopy;  Laterality: N/A;   ESOPHAGOGASTRODUODENOSCOPY (EGD) WITH PROPOFOL N/A 03/19/2021   Procedure: ESOPHAGOGASTRODUODENOSCOPY (EGD) WITH PROPOFOL;  Surgeon: Meridee Score Netty Starring., MD;  Location: [facility] ENDOSCOPY;  Service: Gastroenterology;  Laterality: N/A;   ESOPHAGOGASTRODUODENOSCOPY (EGD) WITH PROPOFOL N/A 05/13/2022   Procedure: ESOPHAGOGASTRODUODENOSCOPY (EGD) WITH PROPOFOL;  Surgeon: Meridee Score Netty Starring., MD;  Location: [facility] ENDOSCOPY;  Service: Gastroenterology;  Laterality: N/A;   HEMOSTASIS CLIP PLACEMENT  03/19/2021   Procedure: HEMOSTASIS CLIP PLACEMENT;  Surgeon: Lemar Lofty., MD;  Location: [facility] ENDOSCOPY;  Service: Gastroenterology;;   HEMOSTASIS CLIP PLACEMENT  05/13/2022   Procedure: HEMOSTASIS CLIP PLACEMENT;  Surgeon: Lemar Lofty., MD;  Location: [facility] ENDOSCOPY;  Service: Gastroenterology;;   MULTIPLE TOOTH EXTRACTIONS     dentures   POLYPECTOMY  03/19/2021   Procedure: POLYPECTOMY;  Surgeon: Lemar Lofty., MD;  Location: [facility] ENDOSCOPY;  Service: Gastroenterology;;   POLYPECTOMY  05/13/2022   Procedure: POLYPECTOMY;  Surgeon: Lemar Lofty., MD;  Location: [facility] ENDOSCOPY;  Service: Gastroenterology;;  EGD and colon   SUBMUCOSAL LIFTING INJECTION  03/19/2021   Procedure: SUBMUCOSAL LIFTING INJECTION;  Surgeon: Lemar Lofty., MD;  Location: [facility] ENDOSCOPY;  Service: Gastroenterology;;   SUBMUCOSAL LIFTING INJECTION  05/13/2022   Procedure: SUBMUCOSAL LIFTING INJECTION;  Surgeon: Lemar Lofty., MD;  Location: [facility] ENDOSCOPY;  Service: Gastroenterology;;   SUBMUCOSAL TATTOO INJECTION  05/13/2022   Procedure: SUBMUCOSAL TATTOO INJECTION;  Surgeon: Lemar Lofty., MD;  Location: [facility] ENDOSCOPY;  Service: Gastroenterology;;   TONSILLECTOMY     UPPER ESOPHAGEAL  ENDOSCOPIC ULTRASOUND (EUS) N/A 03/19/2021   Procedure: UPPER ESOPHAGEAL ENDOSCOPIC ULTRASOUND (EUS);  Surgeon: Lemar Lofty., MD;  Location: [facility] ENDOSCOPY;  Service: Gastroenterology;  Laterality: N/A;    Family History  Problem Relation Age of Onset   Hypertension Mother    Diabetes Mother    Osteoarthritis Mother    Osteoporosis Mother    Stroke Father    Cancer Father        unknown origin   Stomach cancer Maternal Grandmother    Kidney cancer Maternal Grandmother    Liver cancer Maternal Grandmother    Lung cancer Maternal Grandfather    Kidney disease Neg Hx    Prostate cancer Neg Hx    Bladder Cancer Neg Hx    Colon cancer Neg Hx    Esophageal cancer Neg Hx    Inflammatory bowel disease Neg Hx    Pancreatic cancer Neg Hx    Rectal cancer Neg Hx    Colon polyps Neg Hx      Social History   Tobacco Use   Smoking status: Former    Current packs/day: 0.00    Types: Cigarettes    Start date: 03/05/1990    Quit date: 03/05/2004    Years since quitting: 19.6   Smokeless tobacco: Current    Types: Chew  Vaping Use   Vaping status: Never Used  Substance Use Topics   Alcohol use: Not Currently    Alcohol/week: 1.0 standard drink of alcohol    Types: 1 Cans of beer per week   Drug use: Not Currently    Types: Marijuana    Comment: occ use    Prior to Admission medications   Medication Sig Start Date End Date Taking? Authorizing Provider  acetaminophen (TYLENOL) 650 MG CR tablet Take 1,300 mg by mouth See admin instructions. Take 1300 mg in the morning, may take a second 1300 mg dose during the day as needed for pain   Yes [provider]  albuterol (VENTOLIN HFA) 108 (90 Base) MCG/ACT inhaler Inhale 2 puffs into the lungs every 6 (six) hours as needed for wheezing or shortness of breath.   Yes [provider]  amLODipine (NORVASC) 5 MG tablet Take 1 tablet (5 mg total) by mouth daily. 07/29/22  Yes Sunnie Nielsen, DO  apixaban (ELIQUIS) 5 MG  TABS tablet Take 1 tablet (5 mg total) by mouth 2 (two) times daily. 07/07/23  Yes Debbe Odea, MD  colchicine 0.6 MG tablet Take 0.6 mg by mouth 2 (two) times daily as needed (gout).   Yes [provider]  enalapril (VASOTEC) 10 MG tablet Take 1 tablet (10 mg total) by mouth daily. 07/29/22  Yes Sunnie Nielsen, DO  gabapentin (NEURONTIN) 300 MG capsule Take 300 mg p.o. every morning, take 600 mg p.o. nightly 07/28/22  Yes Sunnie Nielsen, DO  meclizine (ANTIVERT) 25 MG tablet Take 25 mg by mouth every 6 (six) hours as needed. 02/07/23  Yes [provider]  metFORMIN (GLUCOPHAGE) 1000 MG tablet Take 1,000 mg by mouth daily with breakfast.   Yes [provider]  metoCLOPramide (REGLAN) 10 MG tablet Take 1 tablet (10 mg total) by mouth every 6 (six) hours as needed. 09/17/23  Yes Sharman Cheek, MD  metoprolol succinate (TOPROL XL) 25 MG 24 hr tablet Take 1 tablet (25 mg total) by mouth every evening. 01/26/23  Yes Lanier Prude, MD  omeprazole (PRILOSEC) 20 MG capsule Take 20 mg by mouth 2 (two) times daily. 04/21/23  Yes [provider]  ondansetron (ZOFRAN) 8 MG tablet Take 8 mg by mouth 3 (three) times daily. 01/26/23  Yes [provider]  tamsulosin (FLOMAX) 0.4 MG CAPS capsule Take 1 capsule (0.4 mg total) by mouth daily. 03/16/22  Yes McGowan, Carollee Herter A, PA-C  tiZANidine (ZANAFLEX) 2 MG tablet Take 2 mg by mouth 3 (three) times daily. 09/26/23  Yes [provider]  atorvastatin (LIPITOR) 40 MG tablet Take 1 tablet (40 mg total) by mouth daily. Patient not taking: Reported on 10/17/2023 07/29/22   Sunnie Nielsen, DO  cyclobenzaprine (FLEXERIL) 10 MG tablet Take 10 mg by mouth 3 (three) times daily as needed for muscle spasms. Patient not taking: Reported on 10/17/2023    [provider]  famotidine (PEPCID) 20 MG tablet Take 1 tablet (20 mg total) by mouth 2 (two) times daily. Patient not taking: Reported on 10/17/2023  09/17/23   Sharman Cheek, MD  finasteride (PROSCAR) 5 MG tablet Take 1 tablet (5 mg total) by mouth daily. Patient not taking: Reported on 10/17/2023 05/11/23   Vanna Scotland, MD  sucralfate (CARAFATE) 1 g tablet Take 1 tablet (1 g total) by mouth 4 (four) times daily. Patient not taking: Reported on 10/17/2023 09/17/23   Sharman Cheek, MD    Current Facility-Administered Medications  Medication Dose Route Frequency  Provider Last Rate Last Admin   0.9 %  sodium chloride infusion   Intravenous Continuous Lorretta Harp, MD 75 mL/hr at 10/18/23 0923 New Bag at 10/18/23 2956   acetaminophen (TYLENOL) tablet 650 mg  650 mg Oral Q6H PRN Lorretta Harp, MD       albuterol (PROVENTIL) (2.5 MG/3ML) 0.083% nebulizer solution 2.5 mg  2.5 mg Inhalation Q4H PRN Lorretta Harp, MD       amLODipine (NORVASC) tablet 5 mg  5 mg Oral Daily Lorretta Harp, MD   5 mg at 10/18/23 0840   apixaban (ELIQUIS) tablet 5 mg  5 mg Oral BID Lorretta Harp, MD   5 mg at 10/18/23 0840   colchicine tablet 0.6 mg  0.6 mg Oral BID PRN Lorretta Harp, MD       hydrALAZINE (APRESOLINE) injection 5 mg  5 mg Intravenous Q2H PRN Lorretta Harp, MD       insulin aspart (novoLOG) injection 0-5 Units  0-5 Units Subcutaneous QHS Lorretta Harp, MD       insulin aspart (novoLOG) injection 0-9 Units  0-9 Units Subcutaneous TID WC Lorretta Harp, MD       meclizine (ANTIVERT) tablet 25 mg  25 mg Oral Q6H PRN Lorretta Harp, MD       metoprolol succinate (TOPROL-XL) 24 hr tablet 25 mg  25 mg Oral QPM Lorretta Harp, MD   25 mg at 10/17/23 2213   morphine (PF) 2 MG/ML injection 2 mg  2 mg Intravenous Q4H PRN Lorretta Harp, MD   2 mg at 10/18/23 0502   ondansetron (ZOFRAN) injection 4 mg  4 mg Intravenous Q8H PRN Lorretta Harp, MD       piperacillin-tazobactam (ZOSYN) IVPB 3.375 g  3.375 g Intravenous Q8H Merryl Hacker, RPH   Stopped at 10/18/23 2130   potassium chloride 10 mEq in 100 mL IVPB  10 mEq Intravenous Q1 Hr x 4 Delfino Lovett, MD 100 mL/hr at 10/18/23 0927 10 mEq at  10/18/23 0927   tamsulosin (FLOMAX) capsule 0.4 mg  0.4 mg Oral QPC breakfast Lorretta Harp, MD   0.4 mg at 10/18/23 0840   tiZANidine (ZANAFLEX) tablet 4 mg  4 mg Oral QHS Lorretta Harp, MD   4 mg at 10/17/23 2229   Current Outpatient Medications  Medication Sig Dispense Refill   acetaminophen (TYLENOL) 650 MG CR tablet Take 1,300 mg by mouth See admin instructions. Take 1300 mg in the morning, may take a second 1300 mg dose during the day as needed for pain     albuterol (VENTOLIN HFA) 108 (90 Base) MCG/ACT inhaler Inhale 2 puffs into the lungs every 6 (six) hours as needed for wheezing or shortness of breath.     amLODipine (NORVASC) 5 MG tablet Take 1 tablet (5 mg total) by mouth daily. 30 tablet 0   apixaban (ELIQUIS) 5 MG TABS tablet Take 1 tablet (5 mg total) by mouth 2 (two) times daily. 60 tablet 11   colchicine 0.6 MG tablet Take 0.6 mg by mouth 2 (two) times daily as needed (gout).     enalapril (VASOTEC) 10 MG tablet Take 1 tablet (10 mg total) by mouth daily. 30 tablet 0   gabapentin (NEURONTIN) 300 MG capsule Take 300 mg p.o. every morning, take 600 mg p.o. nightly 90 capsule 0   meclizine (ANTIVERT) 25 MG tablet Take 25 mg by mouth every 6 (six) hours as needed.     metFORMIN (GLUCOPHAGE) 1000 MG tablet Take 1,000 mg by mouth daily  with breakfast.     metoCLOPramide (REGLAN) 10 MG tablet Take 1 tablet (10 mg total) by mouth every 6 (six) hours as needed. 30 tablet 0   metoprolol succinate (TOPROL XL) 25 MG 24 hr tablet Take 1 tablet (25 mg total) by mouth every evening. 90 tablet 3   omeprazole (PRILOSEC) 20 MG capsule Take 20 mg by mouth 2 (two) times daily.     ondansetron (ZOFRAN) 8 MG tablet Take 8 mg by mouth 3 (three) times daily.     tamsulosin (FLOMAX) 0.4 MG CAPS capsule Take 1 capsule (0.4 mg total) by mouth daily. 90 capsule 3   tiZANidine (ZANAFLEX) 2 MG tablet Take 2 mg by mouth 3 (three) times daily.     atorvastatin (LIPITOR) 40 MG tablet Take 1 tablet (40 mg total) by  mouth daily. (Patient not taking: Reported on 10/17/2023) 30 tablet 0   cyclobenzaprine (FLEXERIL) 10 MG tablet Take 10 mg by mouth 3 (three) times daily as needed for muscle spasms. (Patient not taking: Reported on 10/17/2023)     famotidine (PEPCID) 20 MG tablet Take 1 tablet (20 mg total) by mouth 2 (two) times daily. (Patient not taking: Reported on 10/17/2023) 60 tablet 0   finasteride (PROSCAR) 5 MG tablet Take 1 tablet (5 mg total) by mouth daily. (Patient not taking: Reported on 10/17/2023) 90 tablet 4   sucralfate (CARAFATE) 1 g tablet Take 1 tablet (1 g total) by mouth 4 (four) times daily. (Patient not taking: Reported on 10/17/2023) 120 tablet 1    Allergies as of 10/17/2023   (No Known Allergies)     Review of Systems:    All systems reviewed and negative except where noted in HPI.     Physical Exam:  Vital signs in last 24 hours: Temp:  [98 F (36.7 C)-98.4 F (36.9 C)] 98.1 F (36.7 C) (12/17 0850) Pulse Rate:  [65-96] 80 (12/17 0930) Resp:  [9-36] 16 (12/17 0930) BP: (93-159)/(52-89) 138/62 (12/17 0930) SpO2:  [93 %-100 %] 99 % (12/17 0930) Weight:  [68 kg] 68 kg (12/16 2014)   General:   Pleasant man in NAD Head:  Normocephalic and atraumatic. Eyes:   No icterus.   Conjunctiva pink. Ears:  Normal auditory acuity. Mouth: Mucosa pink moist, no lesions. Neck:  Supple; no masses felt Lungs:  Respirations even and unlabored. Lungs clear to auscultation bilaterally.   No wheezes, crackles, or rhonchi.  Heart:  S1S2, RRR, no MRG. No edema. Abdomen:   Flat, soft, nondistended, nontender. Normal bowel sounds. No appreciable masses or hepatomegaly. No rebound signs or other peritoneal signs. Rectal:  Not performed.  Msk:  MAEW x4, No clubbing or cyanosis. Strength 5/5. Symmetrical without gross deformities. Neurologic:  Alert and  oriented x4;  Cranial nerves II-XII intact.  Skin:  Warm, dry, pink without significant lesions or rashes. Psych:  Alert and cooperative.  Normal affect.  LAB RESULTS: Recent Labs    10/17/23 0942 10/18/23 0459  WBC 6.5 5.0  HGB 14.2 11.3*  HCT 38.7* 31.2*  PLT 257 211   BMET Recent Labs    10/17/23 0942 10/18/23 0459  NA 136 135  K 2.1* <2.0*  CL 95* 99  CO2 31 27  GLUCOSE 150* 107*  BUN <5* <5*  CREATININE 0.79 0.73  CALCIUM 8.5* 7.3*   LFT Recent Labs    10/17/23 0942  PROT 6.1*  ALBUMIN 3.0*  AST 20  ALT 12  ALKPHOS 72  BILITOT 1.1   PT/INR Recent  Labs    10/18/23 0845  LABPROT 15.6*  INR 1.2    STUDIES: CT ABDOMEN PELVIS W CONTRAST Result Date: 10/17/2023 CLINICAL DATA:  Vomiting for 1 month. Clinical concern for bowel obstruction. EXAM: CT ABDOMEN AND PELVIS WITH CONTRAST TECHNIQUE: Multidetector CT imaging of the abdomen and pelvis was performed using the standard protocol following bolus administration of intravenous contrast. RADIATION DOSE REDUCTION: This exam was performed according to the departmental dose-optimization program which includes automated exposure control, adjustment of the mA and/or kV according to patient size and/or use of iterative reconstruction technique. CONTRAST:  OMNIPAQUE IOHEXOL 300 MG/ML  SOLN COMPARISON:  09/17/2023 FINDINGS: Lower chest: Unremarkable. Hepatobiliary: No suspicious focal abnormality within the liver parenchyma. Cholecystectomy. No intrahepatic or extrahepatic biliary dilation. Pancreas: No focal mass lesion. No dilatation of the main duct. No intraparenchymal cyst. No peripancreatic edema. Spleen: No splenomegaly. No suspicious focal mass lesion. Adrenals/Urinary Tract: Right adrenal gland unremarkable. Stable 10 mm left adrenal nodule also unchanged when comparing back to a study from 02/07/2007, consistent with benign etiology such as adenoma. No followup imaging is recommended. Tiny well-defined homogeneous low-density lesions in both kidneys are too small to characterize but are statistically most likely benign and probably cysts. No followup  imaging is recommended. No evidence for hydroureter. The urinary bladder appears normal for the degree of distention. Stomach/Bowel: Stomach is unremarkable. No gastric wall thickening. No evidence of outlet obstruction. Duodenum is normally positioned as is the ligament of Treitz. No small bowel wall thickening. No small bowel dilatation. The terminal ileum is normal. The appendix is not well visualized, but there is no edema or inflammation in the region of the cecal tip to suggest appendicitis. Colon is decompressed which accentuates wall thickness, but the wall of the cecum and ascending colon is ill-defined raising the question of mural edema. Vascular/Lymphatic: There is moderate atherosclerotic calcification of the abdominal aorta without aneurysm. There is no gastrohepatic or hepatoduodenal ligament lymphadenopathy. No retroperitoneal or mesenteric lymphadenopathy. No pelvic sidewall lymphadenopathy. Reproductive: Prostate gland is enlarged. Other: No intraperitoneal free fluid. Musculoskeletal: Stable sclerotic focus right iliac crest, likely a bone island IMPRESSION: 1. Colon is decompressed which accentuates wall thickness, but the wall of the cecum and ascending colon is ill-defined raising the question of mural edema. While not definite, imaging features could be related to right-sided colitis. 2. No evidence for small bowel obstruction. 3. Prostatomegaly. 4.  Aortic Atherosclerosis (ICD10-I70.0). Electronically Signed   By: Kennith Center M.D.   On: 10/17/2023 18:18       Impression / Plan:   Diarrhea/hypokalemia/hot flashes/dysphagia/dyspepsia/ weight loss/history of adenomatous polyps and gastric NET. I am concerned he has had a recurrence somewhere- given symptoms and history think he would benefit from egd/colonsocopy. Would like to check chomogranin a off ppi in a few days. Considering VIP test. He may not benefit from the current abx.  Thank you very much for this consult. These services  were provided by Vevelyn Pat, NP-C, in collaboration with Regis Bill, MD, with whom I have discussed this patient in full.   Vevelyn Pat, NP-C  Agree with note as documented by NP Kathryne Hitch, will tentatively plan for EGD/Colonoscopy on Thursday after DOAC washout and electrolytes are repleted. Will continue to follow, please call with any questions.

## 2023-10-18 NOTE — ED Notes (Addendum)
Dr. Sherryll Burger notified pt's potassium <2. See orders.

## 2023-10-18 NOTE — Plan of Care (Signed)
  Problem: Education: Goal: Ability to describe self-care measures that may prevent or decrease complications (Diabetes Survival Skills Education) will improve Outcome: Progressing Goal: Individualized Educational Video(s) Outcome: Progressing   

## 2023-10-19 DIAGNOSIS — E440 Moderate protein-calorie malnutrition: Secondary | ICD-10-CM | POA: Diagnosis not present

## 2023-10-19 DIAGNOSIS — R112 Nausea with vomiting, unspecified: Secondary | ICD-10-CM | POA: Diagnosis not present

## 2023-10-19 DIAGNOSIS — C7A8 Other malignant neuroendocrine tumors: Secondary | ICD-10-CM | POA: Diagnosis not present

## 2023-10-19 DIAGNOSIS — E876 Hypokalemia: Secondary | ICD-10-CM | POA: Diagnosis not present

## 2023-10-19 LAB — POTASSIUM: Potassium: 2.7 mmol/L — CL (ref 3.5–5.1)

## 2023-10-19 LAB — BASIC METABOLIC PANEL
Anion gap: 9 (ref 5–15)
Anion gap: 7 (ref 5–15)
BUN: <5 mg/dL — ABNORMAL LOW (ref 8–23)
BUN: <5 mg/dL — ABNORMAL LOW (ref 8–23)
CO2: 27 mmol/L (ref 22–32)
CO2: 29 mmol/L (ref 22–32)
Calcium: 7.3 mg/dL — ABNORMAL LOW (ref 8.9–10.3)
Calcium: 7.5 mg/dL — ABNORMAL LOW (ref 8.9–10.3)
Chloride: 99 mmol/L (ref 98–111)
Chloride: 104 mmol/L (ref 98–111)
Creatinine, Ser: 0.73 mg/dL (ref 0.61–1.24)
Creatinine, Ser: 0.76 mg/dL (ref 0.61–1.24)
GFR, Estimated: >60 mL/min (ref >60)
GFR, Estimated: >60 mL/min (ref >60)
Glucose, Bld: 107 mg/dL — ABNORMAL HIGH (ref 70–99)
Glucose, Bld: 81 mg/dL (ref 70–99)
Potassium: <2 mmol/L — CL (ref 3.5–5.1)
Potassium: 2.8 mmol/L — ABNORMAL LOW (ref 3.5–5.1)
Sodium: 135 mmol/L (ref 135–145)
Sodium: 140 mmol/L (ref 135–145)

## 2023-10-19 LAB — COMPREHENSIVE METABOLIC PANEL
ALT: 11 U/L (ref 0–44)
AST: 16 U/L (ref 15–41)
Albumin: 2.5 g/dL — ABNORMAL LOW (ref 3.5–5.0)
Alkaline Phosphatase: 59 U/L (ref 38–126)
Anion gap: 10 (ref 5–15)
BUN: <5 mg/dL — ABNORMAL LOW (ref 8–23)
CO2: 30 mmol/L (ref 22–32)
Calcium: 7.4 mg/dL — ABNORMAL LOW (ref 8.9–10.3)
Chloride: 99 mmol/L (ref 98–111)
Creatinine, Ser: 0.66 mg/dL (ref 0.61–1.24)
GFR, Estimated: >60 mL/min (ref >60)
Glucose, Bld: 122 mg/dL — ABNORMAL HIGH (ref 70–99)
Potassium: 2.5 mmol/L — CL (ref 3.5–5.1)
Sodium: 139 mmol/L (ref 135–145)
Total Bilirubin: 1 mg/dL (ref <1.2)
Total Protein: 5.2 g/dL — ABNORMAL LOW (ref 6.5–8.1)

## 2023-10-19 LAB — GLUCOSE, CAPILLARY
Glucose-Capillary: 106 mg/dL — ABNORMAL HIGH (ref 70–99)
Glucose-Capillary: 124 mg/dL — ABNORMAL HIGH (ref 70–99)
Glucose-Capillary: 97 mg/dL (ref 70–99)
Glucose-Capillary: 95 mg/dL (ref 70–99)

## 2023-10-19 LAB — MAGNESIUM: Magnesium: 2 mg/dL (ref 1.7–2.4)

## 2023-10-19 LAB — CBC
HCT: 32.8 % — ABNORMAL LOW (ref 39.0–52.0)
Hemoglobin: 11.9 g/dL — ABNORMAL LOW (ref 13.0–17.0)
MCH: 29 pg (ref 26.0–34.0)
MCHC: 36.3 g/dL — ABNORMAL HIGH (ref 30.0–36.0)
MCV: 79.8 fL — ABNORMAL LOW (ref 80.0–100.0)
Platelets: 214 10*3/uL (ref 150–400)
RBC: 4.11 MIL/uL — ABNORMAL LOW (ref 4.22–5.81)
RDW: 13.5 % (ref 11.5–15.5)
WBC: 5 10*3/uL (ref 4.0–10.5)
nRBC: 0 % (ref 0.0–0.2)

## 2023-10-19 LAB — PHOSPHORUS: Phosphorus: 3.4 mg/dL (ref 2.5–4.6)

## 2023-10-19 MED ORDER — POTASSIUM CHLORIDE 10 MEQ/100ML IV SOLN
10.0000 meq | INTRAVENOUS | Status: AC
  Administered 2023-10-19 – 2023-10-20 (×2): 10 meq via INTRAVENOUS
  Filled 2023-10-19 (×2): qty 100

## 2023-10-19 MED ORDER — POTASSIUM CHLORIDE CRYS ER 20 MEQ PO TBCR
40.0000 meq | EXTENDED_RELEASE_TABLET | Freq: Once | ORAL | Status: AC
  Administered 2023-10-19: 40 meq via ORAL
  Filled 2023-10-19: qty 2

## 2023-10-19 MED ORDER — BOOST / RESOURCE BREEZE PO LIQD CUSTOM
1.0000 | Freq: Three times a day (TID) | ORAL | Status: DC
Start: 2023-10-19 — End: 2023-10-21
  Administered 2023-10-20 – 2023-10-21 (×2): 1 via ORAL

## 2023-10-19 MED ORDER — PEG 3350-KCL-NA BICARB-NACL 420 G PO SOLR
4000.0000 mL | Freq: Once | ORAL | Status: AC
  Administered 2023-10-19: 4000 mL via ORAL
  Filled 2023-10-19: qty 4000

## 2023-10-19 MED ORDER — POTASSIUM CHLORIDE CRYS ER 20 MEQ PO TBCR
40.0000 meq | EXTENDED_RELEASE_TABLET | ORAL | Status: AC
  Administered 2023-10-19 – 2023-10-20 (×2): 40 meq via ORAL
  Filled 2023-10-19 (×2): qty 2

## 2023-10-19 MED ORDER — POTASSIUM CHLORIDE 10 MEQ/100ML IV SOLN
10.0000 meq | INTRAVENOUS | Status: AC
  Administered 2023-10-19 (×4): 10 meq via INTRAVENOUS
  Filled 2023-10-19 (×4): qty 100

## 2023-10-19 MED ORDER — TAMSULOSIN HCL 0.4 MG PO CAPS
0.4000 mg | ORAL_CAPSULE | Freq: Two times a day (BID) | ORAL | Status: DC
  Administered 2023-10-19 – 2023-10-21 (×4): 0.4 mg via ORAL
  Filled 2023-10-19 (×4): qty 1

## 2023-10-19 NOTE — TOC CM/SW Note (Signed)
Transition of Care [facility]) - Inpatient Brief Assessment   Patient Details  Name: Kevin Esparza MRN: [id number] Date of Birth: Oct 15, 1961  Transition of Care [facility]) CM/SW Contact:    Chapman Fitch, RN Phone Number: 10/19/2023, 9:38 AM   Clinical Narrative:   Transition of Care [facility]) Screening Note   Patient Details  Name: Kevin Esparza Date of Birth: 28-Mar-1961   Transition of Care [facility]) CM/SW Contact:    Chapman Fitch, RN Phone Number: 10/19/2023, 9:38 AM    Transition of Care [facility]) has reviewed patient and no TOC needs have been identified at this time. . If new patient transition needs arise, please place a TOC consult.    Transition of Care Asessment: Insurance and Status: Insurance coverage has been reviewed Patient has primary care physician: Yes     Prior/Current Home Services: No current home services Social Drivers of Health Review: SDOH reviewed no interventions necessary Readmission risk has been reviewed: Yes Transition of care needs: no transition of care needs at this time

## 2023-10-19 NOTE — Progress Notes (Signed)
Initial Nutrition Assessment  DOCUMENTATION CODES:   Non-severe (moderate) malnutrition in context of chronic illness  INTERVENTION:   Boost Breeze po TID, each supplement provides 250 kcal and 9 grams of protein  Ensure Enlive po TID with diet advancement, each supplement provides 350 kcal and 20 grams of protein.  MVI po daily with diet advancement   Pt at high refeed risk; recommend monitor potassium, magnesium and phosphorus labs daily until stable  Daily weights   NUTRITION DIAGNOSIS:   Moderate Malnutrition related to chronic illness as evidenced by mild fat depletion, moderate fat depletion, mild muscle depletion.  GOAL:   Patient will meet greater than or equal to 90% of their needs  MONITOR:   PO intake, Supplement acceptance, Diet advancement, Labs, Weight trends, I & O's, Skin  REASON FOR ASSESSMENT:   Malnutrition Screening Tool    ASSESSMENT:   62 y/o male with h/o delayed gastric emptying (2008), Schatzki ring s/p dilatation (2022), hiatal hernia, stomach neuroendocrine carcinoma, marijuana use, HTN, HLD, DM,  dCHF, stroke, gout, BPH, A-fib on Eliquis and chronic diarrhea (after cholecystectomy) who is admitted with vomiting.  Met with pt in room today. Pt reports decreased oral intake for the past 7 weeks r/t vomiting. Pt reports chronic diarrhea that has been going on since having his gallbladder removed in 2005; pt reports his stools range from soft to pure liquid and have been worse over the past several days. Pt reports vomiting without nausea for the past 7 weeks. Pt reports that he has only been able to keep down thin liquids. Pt reports that he tried to drink Gatorade and Ensure but reports that he just regurgitated it back up. Pt has been able to drink some soda and water. Pt reports solid food getting stuck around his mid chest but also reports liquids getting stuck sometimes in his throat and coming back up. Pt does have a h/o delayed gastric emptying,  hiatal hernia and Schatzki ring that required dilation. Pt reports that his UBW is ~190lbs; pt reports a 40lb weight loss over the past two months. Per chart, pt appears to be down 12lbs(7%) since October. Pt does not appear to have weighed 190lbs since 2019. RD will add supplements to help pt meet his estimated needs. Pt is at high refeed risk. Plan is for EGD/colonoscopy tomorrow.    Medications reviewed and include: insulin, KCl  Labs reviewed: K 2.8(L), BUN <5(L), P 3.4 wnl, Mg 2.0 wnl Cbgs- 124, 106 x 24 hrs  AIC 6.0(H)- 5/24  NUTRITION - FOCUSED PHYSICAL EXAM:  Flowsheet Row Most Recent Value  Orbital Region No depletion  Upper Arm Region Moderate depletion  Thoracic and Lumbar Region Mild depletion  Buccal Region No depletion  Temple Region No depletion  Clavicle Bone Region Mild depletion  Clavicle and Acromion Bone Region Mild depletion  Scapular Bone Region No depletion  Dorsal Hand No depletion  Patellar Region Mild depletion  Anterior Thigh Region Mild depletion  Posterior Calf Region Mild depletion  Edema (RD Assessment) None  Hair Reviewed  Eyes Reviewed  Mouth Reviewed  Skin Reviewed  Nails Reviewed   Diet Order:   Diet Order             Diet NPO time specified  Diet effective midnight           Diet clear liquid Room service appropriate? No; Fluid consistency: Thin  Diet effective 0500 tomorrow  EDUCATION NEEDS:   Education needs have been addressed  Skin:  Skin Assessment: Reviewed RN Assessment  Last BM:  12/17  Height:   Ht Readings from Last 1 Encounters:  10/17/23 5\' 10"  (1.778 m)    Weight:   Wt Readings from Last 1 Encounters:  10/19/23 71.1 kg    Ideal Body Weight:  75.45 kg  BMI:  Body mass index is 22.49 kg/m.  Estimated Nutritional Needs:   Kcal:  2000-2300kcal/day  Protein:  100-115g/day  Fluid:  1.8-2.1L/day  Betsey Holiday MS, RD, LDN If unable to be reached, please send secure chat to "RD  inpatient" available from 8:00a-4:00p daily

## 2023-10-19 NOTE — Plan of Care (Signed)
  Problem: Metabolic: Goal: Ability to maintain appropriate glucose levels will improve Outcome: Progressing   Problem: Skin Integrity: Goal: Risk for impaired skin integrity will decrease Outcome: Progressing   Problem: Activity: Goal: Risk for activity intolerance will decrease Outcome: Progressing

## 2023-10-19 NOTE — Progress Notes (Signed)
PHARMACY CONSULT NOTE - FOLLOW UP  Pharmacy Consult for Electrolyte Monitoring and Replacement   Recent Labs: Potassium (mmol/L)  Date Value  10/19/2023 2.5 (LL)  04/20/2014 4.0   Magnesium (mg/dL)  Date Value  40/98/1191 2.0   Calcium (mg/dL)  Date Value  47/82/9562 7.4 (L)   Calcium, Total (mg/dL)  Date Value  13/06/6577 8.9   Albumin (g/dL)  Date Value  46/96/2952 2.5 (L)   Phosphorus (mg/dL)  Date Value  84/13/2440 3.4   Sodium (mmol/L)  Date Value  10/19/2023 139  04/20/2014 134 (L)     Assessment: 12/18:  K @ 0539 = 2.5             Ca = 7.4,  Alb = 2.5,  Corrected Ca = 8.6  Goal of Therapy:  Electrolytes WNL   Plan:  KCl 40 mEq PO X 1 and KCl 10 mEq IV X 4  - Will recheck electrolytes 12/18 @ 1300  Yaslin Kirtley D ,PharmD Clinical Pharmacist 10/19/2023 7:16 AM

## 2023-10-19 NOTE — Progress Notes (Signed)
1      PROGRESS NOTE    Kevin Esparza  YNW:[id number] DOB: 1960-12-20 DOA: 10/17/2023 PCP: Shane Crutch, PA    Brief Narrative:   62 y.o. male with medical history significant of stomach neuroendocrine carcinoma, HTN, HLD, DM,  dCHF, stroke, gout, BPH, A-fib on Eliquis, is admitted for nausea, vomiting, diarrhea   12/17: GI consult 12/18: aggressive K replacement, EGD/C-scope tomorrow, stop zosyn  Assessment & Plan:   Principal Problem:   Nausea vomiting and diarrhea Active Problems:   Colitis   HTN (hypertension)   Hyperlipidemia   Atrial fibrillation (HCC)   Diabetes mellitus without complication (HCC)   Chronic diastolic CHF (congestive heart failure) (HCC)   Hypokalemia   Hypophosphatemia   BPH (benign prostatic hyperplasia)   Gout   Neuroendocrine carcinoma of stomach (HCC)   Malnutrition of moderate degree   Nausea vomiting and diarrhea: Likely due to rectal colitis as evidenced by CT scan.  No fever or leukocytosis.  Clinically not septic.  Patient has history of stomach neuroendocrine carcinoma. - GI seen, EGD & c-scope tomorrow Patient reports on and off diarrhea for over 20 years with no conclusive diagnosis.  He has been following with GI and has had multiple endoscopies per him -As needed Zofran for nausea vomiting -neg C. difficile and GI pathogen panel -Procalcitonin less than 0.1 - stop zosyn   colitis: ruled out. -stop zosyn.  GI planning EGD & c-scope tomorrow   HTN (hypertension): Blood pressure 123/64 on admission -IV hydralazine as needed -Hold enalapril  -Continue amlodipine and metoprolol   Hyperlipidemia -Lipitor   Atrial fibrillation (HCC): Heart rate 80-90s -Metoprolol -hold Eliquis for now - EGD/C-scope tomorrow   Diabetes mellitus without complication [facility]): Recent A1c 6.0, well-controlled.   -Sliding scale insulin   Chronic diastolic CHF (congestive heart failure) (HCC): 2D echo 07/27/2022 showed EF of 60 to 65%.  Patient is  clinically dry. - BNP 23   Hypokalemia and Hypophosphatemia:  -Repleted aggressively by pharmacy team    BPH (benign prostatic hyperplasia) -Flomax bid (per patient home dose)   Gout -hold off as need colchicine if it's contributing to diarrhea   History of neuroendocrine carcinoma of stomach (HCC): Patient had EGD on 05/13/2022 which showed no gross lesion in esophagus, a nonobstructive Schatzki's ring and left gastric esophageal junction and hiatal hernia. - GI planning EGD & c-scope tomorrow   Moderate Malnutrition related to chronic illness as evidenced by mild fat depletion, moderate fat depletion, mild muscle depletion.  Dietitian c/s  DVT prophylaxis: Eliquis on hold       Code Status: Full code Family Communication: no one at bedside Disposition Plan: Possible discharge in next 2 to 3 days depending on clinical condition and GI workup   Consultants:  GI   Subjective:  Diarrhea remains, requesting to increase his Flomax to bid (per home dose). Hoping to get endoscopy tomorrow to get some answers  Objective: Vitals:   10/19/23 0339 10/19/23 0339 10/19/23 0726 10/19/23 1710  BP:  134/70 (!) 125/59 (!) 155/70  Pulse:  88 66 90  Resp:  18 16 18   Temp:  98.9 F (37.2 C) 98.6 F (37 C) 98.2 F (36.8 C)  TempSrc:  Oral Oral   SpO2:  97% 97% 99%  Weight: 71.1 kg     Height:        Intake/Output Summary (Last 24 hours) at 10/19/2023 2021 Last data filed at 10/19/2023 1929 Gross per 24 hour  Intake 1688.16 ml  Output --  Net 1688.16 ml   Filed Weights   10/17/23 2000 10/17/23 2014 10/19/23 0339  Weight: 68 kg 68 kg 71.1 kg    Examination:  General exam: Appears calm and comfortable , malnourished Respiratory system: Clear to auscultation. Respiratory effort normal. Cardiovascular system: S1 & S2 heard, RRR. No JVD, murmurs, rubs, gallops or clicks. No pedal edema. Gastrointestinal system: Abdomen is nondistended, soft and nontender. No organomegaly or  masses felt. Normal bowel sounds heard. Central nervous system: Alert and oriented. No focal neurological deficits. Extremities: Symmetric 5 x 5 power. Skin: No rashes, lesions or ulcers Psychiatry: Judgement and insight appear normal. Mood & affect appropriate.     Data Reviewed: I have personally reviewed following labs and imaging studies  CBC: Recent Labs  Lab 10/17/23 0942 10/18/23 0459 10/19/23 0539  WBC 6.5 5.0 5.0  NEUTROABS 3.6  --   --   HGB 14.2 11.3* 11.9*  HCT 38.7* 31.2* 32.8*  MCV 78.2* 79.2* 79.8*  PLT 257 211 214   Basic Metabolic Panel: Recent Labs  Lab 10/17/23 0942 10/17/23 1527 10/18/23 0459 10/18/23 1607 10/19/23 0539 10/19/23 1247  NA 136  --  135  --  139 140  K 2.1*  --  <2.0* 2.2* 2.5* 2.8*  CL 95*  --  99  --  99 104  CO2 31  --  27  --  30 29  GLUCOSE 150*  --  107*  --  122* 81  BUN <5*  --  <5*  --  <5* <5*  CREATININE 0.79  --  0.73  --  0.66 0.76  CALCIUM 8.5*  --  7.3*  --  7.4* 7.5*  MG 2.0 1.8 1.8  --  2.0  --   PHOS  --  2.0* 4.0  --  3.4  --    GFR: Estimated Creatinine Clearance: 95 mL/min (by C-G formula based on SCr of 0.76 mg/dL). Liver Function Tests: Recent Labs  Lab 10/17/23 0942 10/19/23 0539  AST 20 16  ALT 12 11  ALKPHOS 72 59  BILITOT 1.1 1.0  PROT 6.1* 5.2*  ALBUMIN 3.0* 2.5*   Recent Labs  Lab 10/17/23 0942  LIPASE 27   No results for input(s): "AMMONIA" in the last 168 hours. Coagulation Profile: Recent Labs  Lab 10/18/23 0845  INR 1.2   Cardiac Enzymes: No results for input(s): "CKTOTAL", "CKMB", "CKMBINDEX", "TROPONINI" in the last 168 hours. BNP (last 3 results) No results for input(s): "PROBNP" in the last 8760 hours. HbA1C: No results for input(s): "HGBA1C" in the last 72 hours. CBG: Recent Labs  Lab 10/18/23 1617 10/18/23 2131 10/19/23 0726 10/19/23 1129 10/19/23 1613  GLUCAP 118* 140* 106* 124* 97   Lipid Profile: No results for input(s): "CHOL", "HDL", "LDLCALC", "TRIG",  "CHOLHDL", "LDLDIRECT" in the last 72 hours. Thyroid Function Tests: No results for input(s): "TSH", "T4TOTAL", "FREET4", "T3FREE", "THYROIDAB" in the last 72 hours. Anemia Panel: No results for input(s): "VITAMINB12", "FOLATE", "FERRITIN", "TIBC", "IRON", "RETICCTPCT" in the last 72 hours. Sepsis Labs: Recent Labs  Lab 10/17/23 1527  PROCALCITON <0.10    Recent Results (from the past 240 hours)  C Difficile Quick Screen w PCR reflex     Status: None   Collection Time: 10/18/23  8:05 AM   Specimen: STOOL  Result Value Ref Range Status   C Diff antigen NEGATIVE NEGATIVE Final   C Diff toxin NEGATIVE NEGATIVE Final   C Diff interpretation No C. difficile detected.  Final  Comment: Performed at [facility], [address] [place], [place] [address]  Gastrointestinal Panel by PCR , Stool     Status: None   Collection Time: 10/18/23  8:45 AM   Specimen: STOOL  Result Value Ref Range Status   Campylobacter species NOT DETECTED NOT DETECTED Final   Plesimonas shigelloides NOT DETECTED NOT DETECTED Final   Salmonella species NOT DETECTED NOT DETECTED Final   Yersinia enterocolitica NOT DETECTED NOT DETECTED Final   Vibrio species NOT DETECTED NOT DETECTED Final   Vibrio cholerae NOT DETECTED NOT DETECTED Final   Enteroaggregative E coli (EAEC) NOT DETECTED NOT DETECTED Final   Enteropathogenic E coli (EPEC) NOT DETECTED NOT DETECTED Final   Enterotoxigenic E coli (ETEC) NOT DETECTED NOT DETECTED Final   Shiga like toxin producing E coli (STEC) NOT DETECTED NOT DETECTED Final   Shigella/Enteroinvasive E coli (EIEC) NOT DETECTED NOT DETECTED Final   Cryptosporidium NOT DETECTED NOT DETECTED Final   Cyclospora cayetanensis NOT DETECTED NOT DETECTED Final   Entamoeba histolytica NOT DETECTED NOT DETECTED Final   Giardia lamblia NOT DETECTED NOT DETECTED Final   Adenovirus F40/41 NOT DETECTED NOT DETECTED Final   Astrovirus NOT DETECTED NOT DETECTED Final   Norovirus GI/GII  NOT DETECTED NOT DETECTED Final   Rotavirus A NOT DETECTED NOT DETECTED Final   Sapovirus (I, II, IV, and V) NOT DETECTED NOT DETECTED Final    Comment: Performed at [facility], [address] [place], [place] [address]         Radiology Studies: No results found.       Scheduled Meds:  amLODipine  5 mg Oral Daily   feeding supplement  1 Container Oral TID BM   insulin aspart  0-5 Units Subcutaneous QHS   insulin aspart  0-9 Units Subcutaneous TID WC   metoprolol succinate  25 mg Oral QPM   tamsulosin  0.4 mg Oral BID   tiZANidine  4 mg Oral QHS   Continuous Infusions:     LOS: 1 day    Time spent: 35 minutes    Delfino Lovett, MD [facility] Pager [phone]  If 7PM-7AM, please contact night-coverage [email or web address] Password [email or web address] 10/19/2023, 8:21 PM

## 2023-10-19 NOTE — Consult Note (Signed)
PHARMACY CONSULT NOTE - ELECTROLYTES  Pharmacy Consult for Electrolyte Monitoring and Replacement   Recent Labs: Potassium (mmol/L)  Date Value  10/19/2023 2.7 (LL)  04/20/2014 4.0   Magnesium (mg/dL)  Date Value  46/96/2952 2.0   Calcium (mg/dL)  Date Value  84/13/2440 7.5 (L)   Calcium, Total (mg/dL)  Date Value  08/28/2535 8.9   Albumin (g/dL)  Date Value  64/40/3474 2.5 (L)   Phosphorus (mg/dL)  Date Value  25/95/6387 3.4   Sodium (mmol/L)  Date Value  10/19/2023 140  04/20/2014 134 (L)    Height: 5\' 10"  (177.8 cm) Weight: 71.1 kg (156 lb 12 oz) IBW/kg (Calculated) : 73 Estimated Creatinine Clearance: 95 mL/min (by C-G formula based on SCr of 0.76 mg/dL).  Assessment  Kevin Esparza is a 62 y.o. male presenting with N / V / diarrhea. PMH significant for neuroendocrine carcinoma. Pharmacy has been consulted to monitor and replace electrolytes.  Diet: NPO starting 10/20/23 at 0001 for procedure MIVF: N/A Pertinent medications: Patient has received 170 mEq K+ (IV & PO) replacement so far 10/19/23 and potassium has increased from 2.5 >> 2.7  Goal of Therapy: Electrolytes within normal limits  Plan:  K 2.7, has increased marginally from this AM despite large potassium replacement already received. Will order Kcl 40 mEq PO q2h x 2 doses and Kcl 10 mEq IV x 2 doses (100 mEq total) Follow-up electrolytes with labs tomorrow AM  Thank you for allowing pharmacy to be a part of this patient's care.  Tressie Ellis 10/19/2023 9:02 PM

## 2023-10-19 NOTE — Care Plan (Signed)
Plan for EGD/Colonoscopy tomorrow. Electrolytes will need to be replete before performing procedure. Orders entered for procedures and prep. Likely will be done in early afternoon. Further recs after procedures.  Merlyn Lot MD, MPH [facility] GI

## 2023-10-19 NOTE — Progress Notes (Signed)
PHARMACY CONSULT NOTE - FOLLOW UP  Pharmacy Consult for Electrolyte Monitoring and Replacement   Recent Labs: Potassium (mmol/L)  Date Value  10/19/2023 2.8 (L)  04/20/2014 4.0   Magnesium (mg/dL)  Date Value  09/81/1914 2.0   Calcium (mg/dL)  Date Value  78/29/5621 7.5 (L)   Calcium, Total (mg/dL)  Date Value  30/86/5784 8.9   Albumin (g/dL)  Date Value  69/62/9528 2.5 (L)   Phosphorus (mg/dL)  Date Value  41/32/4401 3.4   Sodium (mmol/L)  Date Value  10/19/2023 140  04/20/2014 134 (L)     Assessment: 12/18:  K @ 1247 = 2.8             Ca = 7.5,  Alb = 2.5,  Corrected Ca = 8.6  Goal of Therapy:  Electrolytes WNL   Plan:  12/18  1247   K= 2.8    Will order KCl 40 mEq PO X 1 and KCl 10 mEq IV X 4 again - Will recheck electrolytes 12/18 @ 2100 -f/u electrolytes in am  Angelique Blonder ,PharmD Clinical Pharmacist 10/19/2023 1:42 PM

## 2023-10-20 ENCOUNTER — Inpatient Hospital Stay: Payer: Medicare HMO | Admitting: Anesthesiology

## 2023-10-20 ENCOUNTER — Encounter: Payer: Self-pay | Admitting: Internal Medicine

## 2023-10-20 ENCOUNTER — Encounter
Admission: EM | Disposition: A | Discharge status: Home / Self Care | Payer: Self-pay | Source: Home / Self Care | Attending: Internal Medicine

## 2023-10-20 DIAGNOSIS — R112 Nausea with vomiting, unspecified: Secondary | ICD-10-CM | POA: Diagnosis not present

## 2023-10-20 DIAGNOSIS — E440 Moderate protein-calorie malnutrition: Secondary | ICD-10-CM | POA: Diagnosis not present

## 2023-10-20 DIAGNOSIS — E876 Hypokalemia: Secondary | ICD-10-CM | POA: Diagnosis not present

## 2023-10-20 HISTORY — PX: COLONOSCOPY WITH PROPOFOL: SHX5780

## 2023-10-20 HISTORY — PX: ESOPHAGOGASTRODUODENOSCOPY (EGD) WITH PROPOFOL: SHX5813

## 2023-10-20 HISTORY — PX: POLYPECTOMY: SHX5525

## 2023-10-20 HISTORY — PX: BIOPSY: SHX5522

## 2023-10-20 LAB — CBC
HCT: 31.9 % — ABNORMAL LOW (ref 39.0–52.0)
Hemoglobin: 11.3 g/dL — ABNORMAL LOW (ref 13.0–17.0)
MCH: 28.8 pg (ref 26.0–34.0)
MCHC: 35.4 g/dL (ref 30.0–36.0)
MCV: 81.2 fL (ref 80.0–100.0)
Platelets: 198 10*3/uL (ref 150–400)
RBC: 3.93 MIL/uL — ABNORMAL LOW (ref 4.22–5.81)
RDW: 13.8 % (ref 11.5–15.5)
WBC: 4.5 10*3/uL (ref 4.0–10.5)
nRBC: 0 % (ref 0.0–0.2)

## 2023-10-20 LAB — GLUCOSE, CAPILLARY
Glucose-Capillary: 98 mg/dL (ref 70–99)
Glucose-Capillary: 98 mg/dL (ref 70–99)
Glucose-Capillary: 148 mg/dL — ABNORMAL HIGH (ref 70–99)
Glucose-Capillary: 132 mg/dL — ABNORMAL HIGH (ref 70–99)

## 2023-10-20 LAB — BASIC METABOLIC PANEL
Anion gap: 7 (ref 5–15)
BUN: <5 mg/dL — ABNORMAL LOW (ref 8–23)
CO2: 26 mmol/L (ref 22–32)
Calcium: 7.5 mg/dL — ABNORMAL LOW (ref 8.9–10.3)
Chloride: 106 mmol/L (ref 98–111)
Creatinine, Ser: 0.69 mg/dL (ref 0.61–1.24)
GFR, Estimated: >60 mL/min (ref >60)
Glucose, Bld: 104 mg/dL — ABNORMAL HIGH (ref 70–99)
Potassium: 3.1 mmol/L — ABNORMAL LOW (ref 3.5–5.1)
Sodium: 139 mmol/L (ref 135–145)

## 2023-10-20 LAB — PHOSPHORUS: Phosphorus: 2.8 mg/dL (ref 2.5–4.6)

## 2023-10-20 LAB — MAGNESIUM: Magnesium: 1.9 mg/dL (ref 1.7–2.4)

## 2023-10-20 LAB — POTASSIUM: Potassium: 3.3 mmol/L — ABNORMAL LOW (ref 3.5–5.1)

## 2023-10-20 SURGERY — ESOPHAGOGASTRODUODENOSCOPY (EGD) WITH PROPOFOL
Anesthesia: General

## 2023-10-20 MED ORDER — STERILE WATER FOR IRRIGATION IR SOLN
Status: DC | PRN
  Administered 2023-10-20: 180 mL

## 2023-10-20 MED ORDER — POTASSIUM CHLORIDE CRYS ER 20 MEQ PO TBCR
20.0000 meq | EXTENDED_RELEASE_TABLET | Freq: Once | ORAL | Status: AC
  Administered 2023-10-20: 20 meq via ORAL
  Filled 2023-10-20: qty 1

## 2023-10-20 MED ORDER — LIDOCAINE HCL (PF) 2 % IJ SOLN
INTRAMUSCULAR | Status: AC
  Filled 2023-10-20: qty 5

## 2023-10-20 MED ORDER — EPHEDRINE SULFATE-NACL 50-0.9 MG/10ML-% IV SOSY
PREFILLED_SYRINGE | INTRAVENOUS | Status: DC | PRN
  Administered 2023-10-20: 10 mg via INTRAVENOUS
  Administered 2023-10-20: 5 mg via INTRAVENOUS

## 2023-10-20 MED ORDER — POTASSIUM CHLORIDE CRYS ER 20 MEQ PO TBCR
40.0000 meq | EXTENDED_RELEASE_TABLET | Freq: Once | ORAL | Status: AC
  Administered 2023-10-20: 40 meq via ORAL
  Filled 2023-10-20: qty 2

## 2023-10-20 MED ORDER — PANTOPRAZOLE SODIUM 40 MG IV SOLR
40.0000 mg | Freq: Two times a day (BID) | INTRAVENOUS | Status: DC
  Administered 2023-10-20 – 2023-10-21 (×3): 40 mg via INTRAVENOUS
  Filled 2023-10-20 (×3): qty 10

## 2023-10-20 MED ORDER — LIDOCAINE HCL (CARDIAC) PF 100 MG/5ML IV SOSY
PREFILLED_SYRINGE | INTRAVENOUS | Status: DC | PRN
  Administered 2023-10-20: 100 mg via INTRAVENOUS

## 2023-10-20 MED ORDER — POTASSIUM CHLORIDE 10 MEQ/100ML IV SOLN
10.0000 meq | INTRAVENOUS | Status: AC
  Administered 2023-10-20 (×3): 10 meq via INTRAVENOUS
  Filled 2023-10-20: qty 100

## 2023-10-20 MED ORDER — PROPOFOL 1000 MG/100ML IV EMUL
INTRAVENOUS | Status: AC
  Filled 2023-10-20: qty 100

## 2023-10-20 MED ORDER — SODIUM CHLORIDE 0.9 % IV SOLN: INTRAVENOUS | Status: DC | PRN

## 2023-10-20 MED ORDER — PROPOFOL 10 MG/ML IV BOLUS
INTRAVENOUS | Status: DC | PRN
  Administered 2023-10-20: 100 mg via INTRAVENOUS
  Administered 2023-10-20: 160 ug/kg/min via INTRAVENOUS

## 2023-10-20 MED ORDER — PHENYLEPHRINE 80 MCG/ML (10ML) SYRINGE FOR IV PUSH (FOR BLOOD PRESSURE SUPPORT)
PREFILLED_SYRINGE | INTRAVENOUS | Status: DC | PRN
  Administered 2023-10-20 (×4): 80 ug via INTRAVENOUS
  Administered 2023-10-20: 160 ug via INTRAVENOUS

## 2023-10-20 MED ORDER — PANTOPRAZOLE SODIUM 40 MG PO TBEC: 40.0000 mg | DELAYED_RELEASE_TABLET | Freq: Every day | ORAL | Status: DC

## 2023-10-20 NOTE — Consult Note (Signed)
PHARMACY CONSULT NOTE - ELECTROLYTES  Pharmacy Consult for Electrolyte Monitoring and Replacement   Recent Labs: Potassium (mmol/L)  Date Value  10/20/2023 3.3 (L)  04/20/2014 4.0   Magnesium (mg/dL)  Date Value  60/45/4098 1.9   Calcium (mg/dL)  Date Value  11/91/4782 7.5 (L)   Calcium, Total (mg/dL)  Date Value  95/62/1308 8.9   Albumin (g/dL)  Date Value  65/78/4696 2.5 (L)   Phosphorus (mg/dL)  Date Value  29/52/8413 2.8   Sodium (mmol/L)  Date Value  10/20/2023 139  04/20/2014 134 (L)    Height: 5\' 10"  (177.8 cm) Weight: 70.8 kg (156 lb) IBW/kg (Calculated) : 73 Estimated Creatinine Clearance: 94.6 mL/min (by C-G formula based on SCr of 0.69 mg/dL).  Assessment  Kevin Esparza is a 62 y.o. male presenting with N / V / diarrhea. PMH significant for neuroendocrine carcinoma. Pharmacy has been consulted to monitor and replace electrolytes.  Diet: NPO starting 10/20/23 at 0001 for procedure MIVF: N/A Pertinent medications: Patient has received 170 mEq K+ (IV & PO) replacement so far 10/19/23 and potassium has increased from 2.5 >> 2.7  Goal of Therapy: Electrolytes within normal limits  Plan:  K 3.3 Will order Kcl 20 mEq x 1 Follow-up electrolytes with labs tomorrow AM  Thank you for allowing pharmacy to be a part of this patient's care.  Merryl Hacker, PharmD Clinical Pharmacist 10/20/2023 7:18 PM

## 2023-10-20 NOTE — Transfer of Care (Signed)
Immediate Anesthesia Transfer of Care Note  Patient: Kevin Esparza  Procedure(s) Performed: ESOPHAGOGASTRODUODENOSCOPY (EGD) WITH PROPOFOL COLONOSCOPY WITH PROPOFOL BIOPSY POLYPECTOMY  Patient Location: Endoscopy Unit  Anesthesia Type:General  Level of Consciousness: awake, alert , and oriented  Airway & Oxygen Therapy: Patient Spontanous Breathing  Post-op Assessment: Report given to RN and Post -op Vital signs reviewed and stable  Post vital signs: Reviewed and stable  Last Vitals:  Vitals Value Taken Time  BP 110/55 10/20/23 1435  Temp 35.8 1436  Pulse 97 10/20/23 1436  Resp 12 10/20/23 1436  SpO2 98 % 10/20/23 1436  Vitals shown include unfiled device data.  Last Pain:  Vitals:   10/20/23 1434  TempSrc:   PainSc: (P) 0-No pain         Complications: No notable events documented.

## 2023-10-20 NOTE — Anesthesia Preprocedure Evaluation (Signed)
Anesthesia Evaluation  Patient identified by MRN, date of birth, ID band Patient awake    Reviewed: Allergy & Precautions, NPO status , Patient's Chart, lab work & pertinent test results  Airway Mallampati: III  TM Distance: <3 FB Neck ROM: full    Dental  (+) Missing   Pulmonary neg pulmonary ROS, neg shortness of breath, former smoker   Pulmonary exam normal        Cardiovascular hypertension, + CAD  Normal cardiovascular exam     Neuro/Psych   Anxiety     CVA  negative psych ROS   GI/Hepatic Neg liver ROS,GERD  Controlled,,  Endo/Other  negative endocrine ROSdiabetes, Type 2    Renal/GU negative Renal ROS  negative genitourinary   Musculoskeletal   Abdominal   Peds  Hematology negative hematology ROS (+)   Anesthesia Other Findings Patient reports that they do not think that any food or pills are stuck in their throat at this time. Patient is NPO appropriate and reports no nausea or vomiting today.   Past Medical History: No date: Anxiety No date: Arthritis     Comment:  knees, hands No date: CHF (congestive heart failure) (HCC) No date: COVID No date: Diabetes mellitus without complication (HCC)     Comment:  type 2 No date: ED (erectile dysfunction) No date: Elevated PSA No date: GERD (gastroesophageal reflux disease) No date: Heartburn No date: History of BPH No date: HLD (hyperlipidemia) No date: HTN (hypertension) 10/17/2023: Nausea & vomiting 02/07/2021: Neuroendocrine carcinoma of stomach (HCC) No date: Pneumonia     Comment:  several times No date: Stomach cancer (HCC) No date: Stroke [facility])  Past Surgical History: 2005: APPENDECTOMY 03/19/2021: BIOPSY     Comment:  Procedure: BIOPSY;  Surgeon: Lemar Lofty.,               MD;  Location: [facility] ENDOSCOPY;  Service: Gastroenterology;; 05/13/2022: BIOPSY     Comment:  Procedure: BIOPSY;  Surgeon: Lemar Lofty.,                MD;  Location: [facility] ENDOSCOPY;  Service: Gastroenterology;; 1997: CERVICAL SPINE SURGERY     Comment:  c4-c7 2005: CHOLECYSTECTOMY 12/31/2020: COLONOSCOPY WITH PROPOFOL; N/A     Comment:  Procedure: COLONOSCOPY WITH PROPOFOL;  Surgeon:               Pasty Spillers, MD;  Location: [facility] ENDOSCOPY;                Service: Endoscopy;  Laterality: N/A; 03/19/2021: COLONOSCOPY WITH PROPOFOL; N/A     Comment:  Procedure: COLONOSCOPY WITH PROPOFOL;  Surgeon:               Meridee Score Netty Starring., MD;  Location: [facility] ENDOSCOPY;                Service: Gastroenterology;  Laterality: N/A; 05/13/2022: COLONOSCOPY WITH PROPOFOL; N/A     Comment:  Procedure: COLONOSCOPY WITH PROPOFOL;  Surgeon:               Meridee Score Netty Starring., MD;  Location: [facility] ENDOSCOPY;                Service: Gastroenterology;  Laterality: N/A; 03/19/2021: ENDOSCOPIC MUCOSAL RESECTION     Comment:  Procedure: ENDOSCOPIC MUCOSAL RESECTION;  Surgeon:               Meridee Score Netty Starring., MD;  Location: [facility] ENDOSCOPY;  Service: Gastroenterology;; 05/13/2022: ENDOSCOPIC MUCOSAL RESECTION; N/A     Comment:  Procedure: ENDOSCOPIC MUCOSAL RESECTION;  Surgeon:               Meridee Score Netty Starring., MD;  Location: [facility] ENDOSCOPY;                Service: Gastroenterology;  Laterality: N/A; 12/31/2020: ESOPHAGOGASTRODUODENOSCOPY (EGD) WITH PROPOFOL; N/A     Comment:  Procedure: ESOPHAGOGASTRODUODENOSCOPY (EGD) WITH               PROPOFOL;  Surgeon: Pasty Spillers, MD;  Location:               [facility] ENDOSCOPY;  Service: Endoscopy;  Laterality: N/A; 03/19/2021: ESOPHAGOGASTRODUODENOSCOPY (EGD) WITH PROPOFOL; N/A     Comment:  Procedure: ESOPHAGOGASTRODUODENOSCOPY (EGD) WITH               PROPOFOL;  Surgeon: Meridee Score Netty Starring., MD;                Location: [facility] ENDOSCOPY;  Service: Gastroenterology;                Laterality: N/A; 05/13/2022: ESOPHAGOGASTRODUODENOSCOPY (EGD) WITH PROPOFOL; N/A     Comment:  Procedure:  ESOPHAGOGASTRODUODENOSCOPY (EGD) WITH               PROPOFOL;  Surgeon: Meridee Score Netty Starring., MD;                Location: [facility] ENDOSCOPY;  Service: Gastroenterology;                Laterality: N/A; 03/19/2021: HEMOSTASIS CLIP PLACEMENT     Comment:  Procedure: HEMOSTASIS CLIP PLACEMENT;  Surgeon:               Lemar Lofty., MD;  Location: [facility] ENDOSCOPY;                Service: Gastroenterology;; 05/13/2022: HEMOSTASIS CLIP PLACEMENT     Comment:  Procedure: HEMOSTASIS CLIP PLACEMENT;  Surgeon:               Lemar Lofty., MD;  Location: [facility] ENDOSCOPY;                Service: Gastroenterology;; No date: MULTIPLE TOOTH EXTRACTIONS     Comment:  dentures 03/19/2021: POLYPECTOMY     Comment:  Procedure: POLYPECTOMY;  Surgeon: Lemar Lofty., MD;  Location: [facility] ENDOSCOPY;  Service:               Gastroenterology;; 05/13/2022: POLYPECTOMY     Comment:  Procedure: POLYPECTOMY;  Surgeon: Lemar Lofty., MD;  Location: [facility] ENDOSCOPY;  Service:               Gastroenterology;;  EGD and colon 03/19/2021: SUBMUCOSAL LIFTING INJECTION     Comment:  Procedure: SUBMUCOSAL LIFTING INJECTION;  Surgeon:               Lemar Lofty., MD;  Location: [facility] ENDOSCOPY;                Service: Gastroenterology;; 05/13/2022: SUBMUCOSAL LIFTING INJECTION     Comment:  Procedure: SUBMUCOSAL LIFTING INJECTION;  Surgeon:               Lemar Lofty., MD;  Location: [facility] ENDOSCOPY;  Service: Gastroenterology;; 05/13/2022: SUBMUCOSAL TATTOO INJECTION     Comment:  Procedure: SUBMUCOSAL TATTOO INJECTION;  Surgeon:               Lemar Lofty., MD;  Location: [facility] ENDOSCOPY;                Service: Gastroenterology;; No date: TONSILLECTOMY 03/19/2021: UPPER ESOPHAGEAL ENDOSCOPIC ULTRASOUND (EUS); N/A     Comment:  Procedure: UPPER ESOPHAGEAL ENDOSCOPIC ULTRASOUND (EUS);              Surgeon: Lemar Lofty., MD;   Location: [facility]               ENDOSCOPY;  Service: Gastroenterology;  Laterality: N/A;  BMI    Body Mass Index: 22.38 kg/m      Reproductive/Obstetrics negative OB ROS                             Anesthesia Physical Anesthesia Plan  ASA: 3  Anesthesia Plan: General   Post-op Pain Management:    Induction: Intravenous  PONV Risk Score and Plan: Propofol infusion and TIVA  Airway Management Planned: Natural Airway and Nasal Cannula  Additional Equipment:   Intra-op Plan:   Post-operative Plan:   Informed Consent: I have reviewed the patients History and Physical, chart, labs and discussed the procedure including the risks, benefits and alternatives for the proposed anesthesia with the patient or authorized representative who has indicated his/her understanding and acceptance.     Dental Advisory Given  Plan Discussed with: Anesthesiologist, CRNA and Surgeon  Anesthesia Plan Comments: (Patient consented for risks of anesthesia including but not limited to:  - adverse reactions to medications - risk of airway placement if required - damage to eyes, teeth, lips or other oral mucosa - nerve damage due to positioning  - sore throat or hoarseness - Damage to heart, brain, nerves, lungs, other parts of body or loss of life  Patient voiced understanding and assent.)       Anesthesia Quick Evaluation

## 2023-10-20 NOTE — Consult Note (Addendum)
PHARMACY CONSULT NOTE - ELECTROLYTES  Pharmacy Consult for Electrolyte Monitoring and Replacement   Recent Labs: Potassium (mmol/L)  Date Value  10/20/2023 3.1 (L)  04/20/2014 4.0   Magnesium (mg/dL)  Date Value  91/47/8295 1.9   Calcium (mg/dL)  Date Value  62/13/0865 7.5 (L)   Calcium, Total (mg/dL)  Date Value  78/46/9629 8.9   Albumin (g/dL)  Date Value  52/84/1324 2.5 (L)   Phosphorus (mg/dL)  Date Value  40/08/2724 2.8   Sodium (mmol/L)  Date Value  10/20/2023 139  04/20/2014 134 (L)    Height: 5\' 10"  (177.8 cm) Weight: 71 kg (156 lb 8.4 oz) IBW/kg (Calculated) : 73 Estimated Creatinine Clearance: 94.9 mL/min (by C-G formula based on SCr of 0.69 mg/dL).  Assessment  Kevin Esparza is a 62 y.o. male presenting with N / V / diarrhea. PMH significant for neuroendocrine carcinoma. Pharmacy has been consulted to monitor and replace electrolytes.  Diet: NPO starting 10/20/23 at 0001 for procedure MIVF: N/A Pertinent medications: Patient has received 170 mEq K+ (IV & PO) replacement so far 10/19/23 and potassium has increased from 2.5 >> 2.7  Goal of Therapy: Electrolytes within normal limits  Plan:  K 3.1 Will order Kcl 40 mEq PO x1 and Kcl 10 mEq IV x 3 doses Will recheck K this evening Follow-up electrolytes with labs tomorrow AM  Thank you for allowing pharmacy to be a part of this patient's care.  Pearl Berlinger A 10/20/2023 7:30 AM

## 2023-10-20 NOTE — Op Note (Signed)
[facility] Gastroenterology Patient Name: Kevin Esparza Procedure Date: 10/20/2023 1:22 PM MRN: [id number] Account #: [id number] Date of Birth: [birth year] Admit Type: Inpatient Age: 62 Room: [facility] ENDO ROOM 2 Gender: Male Note Status: Finalized Instrument Name: Upper Endoscope [id number] Procedure:             Upper GI endoscopy Indications:           Dysphagia, Vomiting Providers:             Eather Colas MD, MD Referring MD:          Shane Crutch, PA Medicines:             Monitored Anesthesia Care Complications:         No immediate complications. Estimated blood loss:                         Minimal. Procedure:             Pre-Anesthesia Assessment:                        - Prior to the procedure, a History and Physical was                         performed, and patient medications and allergies were                         reviewed. The patient is competent. The risks and                         benefits of the procedure and the sedation options and                         risks were discussed with the patient. All questions                         were answered and informed consent was obtained.                         Patient identification and proposed procedure were                         verified by the physician, the nurse, the                         anesthesiologist, the anesthetist and the technician                         in the endoscopy suite. Mental Status Examination:                         alert and oriented. Airway Examination: normal                         oropharyngeal airway and neck mobility. Respiratory                         Examination: clear to auscultation. CV Examination:  normal. Prophylactic Antibiotics: The patient does not                         require prophylactic antibiotics. Prior                         Anticoagulants: The patient has taken no anticoagulant                         or  antiplatelet agents. ASA Grade Assessment: III - A                         patient with severe systemic disease. After reviewing                         the risks and benefits, the patient was deemed in                         satisfactory condition to undergo the procedure. The                         anesthesia plan was to use monitored anesthesia care                         (MAC). Immediately prior to administration of                         medications, the patient was re-assessed for adequacy                         to receive sedatives. The heart rate, respiratory                         rate, oxygen saturations, blood pressure, adequacy of                         pulmonary ventilation, and response to care were                         monitored throughout the procedure. The physical                         status of the patient was re-assessed after the                         procedure.                        After obtaining informed consent, the endoscope was                         passed under direct vision. Throughout the procedure,                         the patient's blood pressure, pulse, and oxygen                         saturations were monitored continuously. The Endoscope  was introduced through the mouth, and advanced to the                         second part of duodenum. The upper GI endoscopy was                         accomplished without difficulty. The patient tolerated                         the procedure well. Findings:      A widely patent Schatzki ring was found in the lower third of the       esophagus.      Normal mucosa was found in the entire esophagus. Biopsies were taken       with a cold forceps for histology. Estimated blood loss was minimal.      An endoclip was found in the gastric body.      The exam of the stomach was otherwise normal.      The examined duodenum was normal. Impression:            - Widely patent  Schatzki ring.                        - Normal mucosa was found in the entire esophagus.                         Biopsied.                        - An endoclip was found in the stomach.                        - Normal examined duodenum. Recommendation:        - Await pathology results.                        - Perform a colonoscopy today. Procedure Code(s):     --- Professional ---                        [phone], Esophagogastroduodenoscopy, flexible,                         transoral; with biopsy, single or multiple Diagnosis Code(s):     --- Professional ---                        K22.2, Esophageal obstruction                        T18.2XXA, Foreign body in stomach, initial encounter                        R13.10, Dysphagia, unspecified                        R11.10, Vomiting, unspecified CPT copyright 2022 American Medical Association. All rights reserved. The codes documented in this report are preliminary and upon coder review may  be revised to meet current compliance requirements. Eather Colas MD, MD 10/20/2023 2:37:38 PM Number of Addenda: 0 Note Initiated On: 10/20/2023 1:22 PM Estimated Blood Loss:  Estimated blood loss was minimal.      [facility]

## 2023-10-20 NOTE — Plan of Care (Signed)
  Problem: Coping: Goal: Ability to adjust to condition or change in health will improve Outcome: Progressing   Problem: Metabolic: Goal: Ability to maintain appropriate glucose levels will improve Outcome: Progressing   Problem: Nutritional: Goal: Maintenance of adequate nutrition will improve Outcome: Progressing   Problem: Nutrition: Goal: Adequate nutrition will be maintained Outcome: Progressing   Problem: Elimination: Goal: Will not experience complications related to bowel motility Outcome: Progressing

## 2023-10-20 NOTE — Op Note (Signed)
[facility] Gastroenterology Patient Name: Kevin Esparza Procedure Date: 10/20/2023 1:20 PM MRN: [id number] Account #: [id number] Date of Birth: 1960-12-23 Admit Type: Inpatient Age: 62 Room: [facility] ENDO ROOM 2 Gender: Male Note Status: Finalized Instrument Name: Prentice Docker [id number] Procedure:             Colonoscopy Indications:           Abnormal CT of the GI tract, Chronic diarrhea Providers:             Eather Colas MD, MD Referring MD:          Shane Crutch, PA Medicines:             Monitored Anesthesia Care Complications:         No immediate complications. Estimated blood loss:                         Minimal. Procedure:             Pre-Anesthesia Assessment:                        - Prior to the procedure, a History and Physical was                         performed, and patient medications and allergies were                         reviewed. The patient is competent. The risks and                         benefits of the procedure and the sedation options and                         risks were discussed with the patient. All questions                         were answered and informed consent was obtained.                         Patient identification and proposed procedure were                         verified by the physician, the nurse, the                         anesthesiologist, the anesthetist and the technician                         in the endoscopy suite. Mental Status Examination:                         alert and oriented. Airway Examination: normal                         oropharyngeal airway and neck mobility. Respiratory                         Examination: clear to auscultation. CV Examination:  normal. Prophylactic Antibiotics: The patient does not                         require prophylactic antibiotics. Prior                         Anticoagulants: The patient has taken Eliquis                          (apixaban), last dose was 2 days prior to procedure.                         ASA Grade Assessment: III - A patient with severe                         systemic disease. After reviewing the risks and                         benefits, the patient was deemed in satisfactory                         condition to undergo the procedure. The anesthesia                         plan was to use monitored anesthesia care (MAC).                         Immediately prior to administration of medications,                         the patient was re-assessed for adequacy to receive                         sedatives. The heart rate, respiratory rate, oxygen                         saturations, blood pressure, adequacy of pulmonary                         ventilation, and response to care were monitored                         throughout the procedure. The physical status of the                         patient was re-assessed after the procedure.                        After obtaining informed consent, the colonoscope was                         passed under direct vision. Throughout the procedure,                         the patient's blood pressure, pulse, and oxygen                         saturations were monitored continuously. The  Colonoscope was introduced through the anus and                         advanced to the the terminal ileum, with                         identification of the appendiceal orifice and IC                         valve. The colonoscopy was performed without                         difficulty. The patient tolerated the procedure well.                         The quality of the bowel preparation was inadequate.                         The terminal ileum, ileocecal valve, appendiceal                         orifice, and rectum were photographed. Findings:      The perianal and digital rectal examinations were normal.      The terminal ileum appeared  normal.      Three sessile polyps were found in the cecum. The polyps were 2 to 7 mm       in size. These polyps were removed with a cold snare. Resection and       retrieval were complete. Estimated blood loss was minimal.      Three sessile polyps were found in the ascending colon. The polyps were       4 to 5 mm in size. These polyps were removed with a cold snare.       Resection and retrieval were complete. Estimated blood loss was minimal.      Two sessile polyps were found in the hepatic flexure. The polyps were 4       to 10 mm in size. These polyps were removed with a cold snare. Resection       and retrieval were complete. Estimated blood loss was minimal.      Two sessile polyps were found in the transverse colon. The polyps were 4       to 5 mm in size. These polyps were removed with a cold snare. Resection       and retrieval were complete. Estimated blood loss was minimal.      Normal mucosa was found in the entire colon. Biopsies for histology were       taken with a cold forceps from the entire colon for evaluation of       microscopic colitis. Estimated blood loss was minimal.      Retroflexion in the rectum was not performed due to poor prep. Impression:            - Preparation of the colon was inadequate.                        - The examined portion of the ileum was normal.                        - Three 2 to 7  mm polyps in the cecum, removed with a                         cold snare. Resected and retrieved.                        - Three 4 to 5 mm polyps in the ascending colon,                         removed with a cold snare. Resected and retrieved.                        - Two 4 to 10 mm polyps at the hepatic flexure,                         removed with a cold snare. Resected and retrieved.                        - Two 4 to 5 mm polyps in the transverse colon,                         removed with a cold snare. Resected and retrieved.                        -  Normal mucosa in the entire examined colon. Biopsied. Recommendation:        - Return patient to hospital ward for ongoing care.                        - Advance diet as tolerated.                        - Continue present medications.                        - Resume Eliquis (apixaban) at prior dose in 2 days.                        - Await pathology results.                        - Repeat colonoscopy in 6 months because the bowel                         preparation was suboptimal.                        - Would consider CTE to evaluate for any small bowel                         lesions. He will need outpatient manometry for trouble                         swallowing. Procedure Code(s):     --- Professional ---                        [phone], Colonoscopy, flexible; with removal of  tumor(s), polyp(s), or other lesion(s) by snare                         technique                        [id number], 59, Colonoscopy, flexible; with biopsy, single                         or multiple Diagnosis Code(s):     --- Professional ---                        D12.0, Benign neoplasm of cecum                        D12.2, Benign neoplasm of ascending colon                        D12.3, Benign neoplasm of transverse colon (hepatic                         flexure or splenic flexure)                        K52.9, Noninfective gastroenteritis and colitis,                         unspecified                        R93.3, Abnormal findings on diagnostic imaging of                         other parts of digestive tract CPT copyright 2022 American Medical Association. All rights reserved. The codes documented in this report are preliminary and upon coder review may  be revised to meet current compliance requirements. Eather Colas MD, MD 10/20/2023 2:44:41 PM Number of Addenda: 0 Note Initiated On: 10/20/2023 1:20 PM Scope Withdrawal Time: 0 hours 21 minutes 10 seconds  Total Procedure  Duration: 0 hours 26 minutes 48 seconds  Estimated Blood Loss:  Estimated blood loss was minimal.      [facility]

## 2023-10-20 NOTE — Anesthesia Postprocedure Evaluation (Signed)
Anesthesia Post Note  Patient: Kevin Esparza  Procedure(s) Performed: ESOPHAGOGASTRODUODENOSCOPY (EGD) WITH PROPOFOL COLONOSCOPY WITH PROPOFOL BIOPSY POLYPECTOMY  Patient location during evaluation: Endoscopy Anesthesia Type: General Level of consciousness: awake and alert Pain management: pain level controlled Vital Signs Assessment: post-procedure vital signs reviewed and stable Respiratory status: spontaneous breathing, nonlabored ventilation, respiratory function stable and patient connected to nasal cannula oxygen Cardiovascular status: blood pressure returned to baseline and stable Postop Assessment: no apparent nausea or vomiting Anesthetic complications: no   No notable events documented.   Last Vitals:  Vitals:   10/20/23 1444 10/20/23 1454  BP: 128/67 (!) 127/97  Pulse: 98 90  Resp: 14 (!) 22  Temp:    SpO2: 97% 99%    Last Pain:  Vitals:   10/20/23 1454  TempSrc:   PainSc: 0-No pain                 Cleda Mccreedy Zaedyn Covin

## 2023-10-20 NOTE — Progress Notes (Signed)
1      PROGRESS NOTE    Kevin Esparza  ZOX:[id number] DOB: Nov 15, 1960 DOA: 10/17/2023 PCP: Shane Crutch, PA    Brief Narrative:   62 y.o. male with medical history significant of stomach neuroendocrine carcinoma, HTN, HLD, DM,  dCHF, stroke, gout, BPH, A-fib on Eliquis, is admitted for nausea, vomiting, diarrhea   12/17: GI consult 12/18: aggressive K replacement,  stop zosyn 12/19: EGD/C-scope today, added Protonix IV  Assessment & Plan:   Principal Problem:   Nausea vomiting and diarrhea Active Problems:   Colitis   HTN (hypertension)   Hyperlipidemia   Atrial fibrillation (HCC)   Diabetes mellitus without complication (HCC)   Chronic diastolic CHF (congestive heart failure) (HCC)   Hypokalemia   Hypophosphatemia   BPH (benign prostatic hyperplasia)   Gout   Neuroendocrine carcinoma of stomach (HCC)   Malnutrition of moderate degree   Nausea vomiting and diarrhea: Likely due to rectal colitis as evidenced by CT scan.  No fever or leukocytosis.  Clinically not septic.  Patient has history of stomach neuroendocrine carcinoma.  Colitis ruled out - GI seen, EGD & c-scope today Patient reports on and off diarrhea for over 20 years with no conclusive diagnosis.  He has been following with GI and has had multiple endoscopies per him -As needed Zofran for nausea vomiting,*Protonix IV as he is complaining of acid reflux -neg C. difficile and GI pathogen panel -Procalcitonin less than 0.1 - stop zosyn   HTN (hypertension): Blood pressure 123/64 on admission -IV hydralazine as needed -Hold enalapril  -Continue amlodipine and metoprolol   Hyperlipidemia -Lipitor   Atrial fibrillation (HCC): Heart rate 80-90s -Metoprolol -hold Eliquis for now - EGD/C-scope tomorrow   Diabetes mellitus without complication [facility]): Recent A1c 6.0, well-controlled.   -Sliding scale insulin   Chronic diastolic CHF (congestive heart failure) (HCC): 2D echo 07/27/2022 showed EF of 60 to 65%.   Patient is clinically dry. - BNP 23   Hypokalemia and Hypophosphatemia:  -Repleted aggressively by pharmacy team    BPH (benign prostatic hyperplasia) -Flomax bid (per patient home dose)   Gout -hold off as need colchicine if it's contributing to diarrhea   History of neuroendocrine carcinoma of stomach (HCC): Patient had EGD on 05/13/2022 which showed no gross lesion in esophagus, a nonobstructive Schatzki's ring and left gastric esophageal junction and hiatal hernia. - GI planning EGD & c-scope today  Moderate Malnutrition related to chronic illness as evidenced by mild fat depletion, moderate fat depletion, mild muscle depletion.  Dietitian seen  DVT prophylaxis: Eliquis on hold       Code Status: Full code Family Communication: no one at bedside Disposition Plan: Possible discharge in next 1-2 days depending on clinical condition and GI workup   Consultants:  GI   Subjective:  Feeling better.  Appreciative of his care here.  Looking forward for endoscopy.  Does report some right knee pain which has been chronic and has been following with Ortho and getting knee injections at times.  Objective: Vitals:   10/19/23 2032 10/20/23 0407 10/20/23 0411 10/20/23 0728  BP: (!) 151/66 121/67  133/81  Pulse: 72 68  75  Resp: 19 17  18   Temp: 98.3 F (36.8 C) 98.5 F (36.9 C)  98.4 F (36.9 C)  TempSrc: Oral Oral  Oral  SpO2: 100% 98%  98%  Weight:   71 kg   Height:        Intake/Output Summary (Last 24 hours) at 10/20/2023 1209 Last data  filed at 10/20/2023 1200 Gross per 24 hour  Intake 1255.34 ml  Output 200 ml  Net 1055.34 ml   Filed Weights   10/17/23 2014 10/19/23 0339 10/20/23 0411  Weight: 68 kg 71.1 kg 71 kg    Examination:  General exam: Appears calm and comfortable , malnourished Respiratory system: Clear to auscultation. Respiratory effort normal. Cardiovascular system: S1 & S2 heard, RRR. No JVD, murmurs, rubs, gallops or clicks. No pedal  edema. Gastrointestinal system: Abdomen is nondistended, soft and nontender. No organomegaly or masses felt. Normal bowel sounds heard. Central nervous system: Alert and oriented. No focal neurological deficits. Extremities: Symmetric 5 x 5 power.  Has some right knee tenderness which has been chronic issue Skin: No rashes, lesions or ulcers Psychiatry: Judgement and insight appear normal. Mood & affect appropriate.     Data Reviewed: I have personally reviewed following labs and imaging studies  CBC: Recent Labs  Lab 10/17/23 0942 10/18/23 0459 10/19/23 0539 10/20/23 0458  WBC 6.5 5.0 5.0 4.5  NEUTROABS 3.6  --   --   --   HGB 14.2 11.3* 11.9* 11.3*  HCT 38.7* 31.2* 32.8* 31.9*  MCV 78.2* 79.2* 79.8* 81.2  PLT 257 211 214 198   Basic Metabolic Panel: Recent Labs  Lab 10/17/23 0942 10/17/23 1527 10/18/23 0459 10/18/23 1607 10/19/23 0539 10/19/23 1247 10/19/23 2027 10/20/23 0458  NA 136  --  135  --  139 140  --  139  K 2.1*  --  <2.0* 2.2* 2.5* 2.8* 2.7* 3.1*  CL 95*  --  99  --  99 104  --  106  CO2 31  --  27  --  30 29  --  26  GLUCOSE 150*  --  107*  --  122* 81  --  104*  BUN <5*  --  <5*  --  <5* <5*  --  <5*  CREATININE 0.79  --  0.73  --  0.66 0.76  --  0.69  CALCIUM 8.5*  --  7.3*  --  7.4* 7.5*  --  7.5*  MG 2.0 1.8 1.8  --  2.0  --   --  1.9  PHOS  --  2.0* 4.0  --  3.4  --   --  2.8   GFR: Estimated Creatinine Clearance: 94.9 mL/min (by C-G formula based on SCr of 0.69 mg/dL). Liver Function Tests: Recent Labs  Lab 10/17/23 0942 10/19/23 0539  AST 20 16  ALT 12 11  ALKPHOS 72 59  BILITOT 1.1 1.0  PROT 6.1* 5.2*  ALBUMIN 3.0* 2.5*   Recent Labs  Lab 10/17/23 0942  LIPASE 27   No results for input(s): "AMMONIA" in the last 168 hours. Coagulation Profile: Recent Labs  Lab 10/18/23 0845  INR 1.2   Cardiac Enzymes: No results for input(s): "CKTOTAL", "CKMB", "CKMBINDEX", "TROPONINI" in the last 168 hours. BNP (last 3 results) No  results for input(s): "PROBNP" in the last 8760 hours. HbA1C: No results for input(s): "HGBA1C" in the last 72 hours. CBG: Recent Labs  Lab 10/19/23 1129 10/19/23 1613 10/19/23 2100 10/20/23 0729 10/20/23 1102  GLUCAP 124* 97 95 98 98   Lipid Profile: No results for input(s): "CHOL", "HDL", "LDLCALC", "TRIG", "CHOLHDL", "LDLDIRECT" in the last 72 hours. Thyroid Function Tests: No results for input(s): "TSH", "T4TOTAL", "FREET4", "T3FREE", "THYROIDAB" in the last 72 hours. Anemia Panel: No results for input(s): "VITAMINB12", "FOLATE", "FERRITIN", "TIBC", "IRON", "RETICCTPCT" in the last 72 hours. Sepsis Labs: Recent  Labs  Lab 10/17/23 1527  PROCALCITON <0.10    Recent Results (from the past 240 hours)  C Difficile Quick Screen w PCR reflex     Status: None   Collection Time: 10/18/23  8:05 AM   Specimen: STOOL  Result Value Ref Range Status   C Diff antigen NEGATIVE NEGATIVE Final   C Diff toxin NEGATIVE NEGATIVE Final   C Diff interpretation No C. difficile detected.  Final    Comment: Performed at [facility], [address] [place], [place] [address]  Gastrointestinal Panel by PCR , Stool     Status: None   Collection Time: 10/18/23  8:45 AM   Specimen: STOOL  Result Value Ref Range Status   Campylobacter species NOT DETECTED NOT DETECTED Final   Plesimonas shigelloides NOT DETECTED NOT DETECTED Final   Salmonella species NOT DETECTED NOT DETECTED Final   Yersinia enterocolitica NOT DETECTED NOT DETECTED Final   Vibrio species NOT DETECTED NOT DETECTED Final   Vibrio cholerae NOT DETECTED NOT DETECTED Final   Enteroaggregative E coli (EAEC) NOT DETECTED NOT DETECTED Final   Enteropathogenic E coli (EPEC) NOT DETECTED NOT DETECTED Final   Enterotoxigenic E coli (ETEC) NOT DETECTED NOT DETECTED Final   Shiga like toxin producing E coli (STEC) NOT DETECTED NOT DETECTED Final   Shigella/Enteroinvasive E coli (EIEC) NOT DETECTED NOT DETECTED Final    Cryptosporidium NOT DETECTED NOT DETECTED Final   Cyclospora cayetanensis NOT DETECTED NOT DETECTED Final   Entamoeba histolytica NOT DETECTED NOT DETECTED Final   Giardia lamblia NOT DETECTED NOT DETECTED Final   Adenovirus F40/41 NOT DETECTED NOT DETECTED Final   Astrovirus NOT DETECTED NOT DETECTED Final   Norovirus GI/GII NOT DETECTED NOT DETECTED Final   Rotavirus A NOT DETECTED NOT DETECTED Final   Sapovirus (I, II, IV, and V) NOT DETECTED NOT DETECTED Final    Comment: Performed at [facility], [address] [place], [place] [address]         Radiology Studies: No results found.       Scheduled Meds:  amLODipine  5 mg Oral Daily   feeding supplement  1 Container Oral TID BM   insulin aspart  0-5 Units Subcutaneous QHS   insulin aspart  0-9 Units Subcutaneous TID WC   metoprolol succinate  25 mg Oral QPM   pantoprazole (PROTONIX) IV  40 mg Intravenous Q12H   tamsulosin  0.4 mg Oral BID   tiZANidine  4 mg Oral QHS   Continuous Infusions:  potassium chloride 10 mEq (10/20/23 1110)      LOS: 2 days    Time spent: 35 minutes    Malana Eberwein Sherryll Burger, MD [facility] Pager [phone]  If 7PM-7AM, please contact night-coverage [email or web address] Password [email or web address] 10/20/2023, 12:09 PM

## 2023-10-21 ENCOUNTER — Inpatient Hospital Stay: Payer: Medicare HMO

## 2023-10-21 ENCOUNTER — Encounter: Payer: Self-pay | Admitting: Gastroenterology

## 2023-10-21 DIAGNOSIS — R197 Diarrhea, unspecified: Secondary | ICD-10-CM | POA: Diagnosis not present

## 2023-10-21 DIAGNOSIS — I10 Essential (primary) hypertension: Secondary | ICD-10-CM | POA: Diagnosis not present

## 2023-10-21 DIAGNOSIS — E876 Hypokalemia: Secondary | ICD-10-CM | POA: Diagnosis not present

## 2023-10-21 DIAGNOSIS — R112 Nausea with vomiting, unspecified: Secondary | ICD-10-CM | POA: Diagnosis not present

## 2023-10-21 LAB — BASIC METABOLIC PANEL
Anion gap: 10 (ref 5–15)
BUN: <5 mg/dL — ABNORMAL LOW (ref 8–23)
CO2: 27 mmol/L (ref 22–32)
Calcium: 7.7 mg/dL — ABNORMAL LOW (ref 8.9–10.3)
Chloride: 102 mmol/L (ref 98–111)
Creatinine, Ser: 0.66 mg/dL (ref 0.61–1.24)
GFR, Estimated: >60 mL/min (ref >60)
Glucose, Bld: 114 mg/dL — ABNORMAL HIGH (ref 70–99)
Potassium: 3.3 mmol/L — ABNORMAL LOW (ref 3.5–5.1)
Sodium: 139 mmol/L (ref 135–145)

## 2023-10-21 LAB — GLUCOSE, CAPILLARY
Glucose-Capillary: 122 mg/dL — ABNORMAL HIGH (ref 70–99)
Glucose-Capillary: 142 mg/dL — ABNORMAL HIGH (ref 70–99)

## 2023-10-21 LAB — MAGNESIUM: Magnesium: 1.8 mg/dL (ref 1.7–2.4)

## 2023-10-21 LAB — CBC
HCT: 30.7 % — ABNORMAL LOW (ref 39.0–52.0)
Hemoglobin: 10.8 g/dL — ABNORMAL LOW (ref 13.0–17.0)
MCH: 28.9 pg (ref 26.0–34.0)
MCHC: 35.2 g/dL (ref 30.0–36.0)
MCV: 82.1 fL (ref 80.0–100.0)
Platelets: 190 10*3/uL (ref 150–400)
RBC: 3.74 MIL/uL — ABNORMAL LOW (ref 4.22–5.81)
RDW: 13.6 % (ref 11.5–15.5)
WBC: 4.9 10*3/uL (ref 4.0–10.5)
nRBC: 0 % (ref 0.0–0.2)

## 2023-10-21 LAB — PHOSPHORUS: Phosphorus: 3.6 mg/dL (ref 2.5–4.6)

## 2023-10-21 LAB — SURGICAL PATHOLOGY

## 2023-10-21 MED ORDER — ENSURE ENLIVE PO LIQD
237.0000 mL | Freq: Three times a day (TID) | ORAL | Status: DC
  Administered 2023-10-21: 237 mL via ORAL

## 2023-10-21 MED ORDER — IOHEXOL 300 MG/ML  SOLN
100.0000 mL | Freq: Once | INTRAMUSCULAR | Status: AC | PRN
  Administered 2023-10-21: 100 mL via INTRAVENOUS

## 2023-10-21 MED ORDER — ADULT MULTIVITAMIN W/MINERALS CH: 1.0000 | ORAL_TABLET | Freq: Every day | ORAL | Status: DC

## 2023-10-21 MED ORDER — MAGNESIUM SULFATE 2 GM/50ML IV SOLN
2.0000 g | Freq: Once | INTRAVENOUS | Status: AC
  Administered 2023-10-21: 2 g via INTRAVENOUS
  Filled 2023-10-21: qty 50

## 2023-10-21 MED ORDER — APIXABAN 5 MG PO TABS: 5.0000 mg | ORAL_TABLET | Freq: Two times a day (BID) | ORAL | Status: DC

## 2023-10-21 MED ORDER — POTASSIUM CHLORIDE CRYS ER 20 MEQ PO TBCR
40.0000 meq | EXTENDED_RELEASE_TABLET | ORAL | Status: DC
  Administered 2023-10-21: 40 meq via ORAL
  Filled 2023-10-21: qty 2

## 2023-10-21 NOTE — Care Management Important Message (Signed)
Important Message  Patient Details  Name: Kevin Esparza MRN: [id number] Date of Birth: 04-21-1961   Important Message Given:  Yes - Medicare IM     Aline Wesche, Stephan Minister 10/21/2023, 10:10 AM

## 2023-10-21 NOTE — Consult Note (Signed)
PHARMACY CONSULT NOTE - ELECTROLYTES  Pharmacy Consult for Electrolyte Monitoring and Replacement   Recent Labs: Potassium (mmol/L)  Date Value  10/21/2023 3.3 (L)  04/20/2014 4.0   Magnesium (mg/dL)  Date Value  29/56/2130 1.8   Calcium (mg/dL)  Date Value  86/57/8469 7.7 (L)   Calcium, Total (mg/dL)  Date Value  62/95/2841 8.9   Albumin (g/dL)  Date Value  32/44/0102 2.5 (L)   Phosphorus (mg/dL)  Date Value  72/53/6644 3.6   Sodium (mmol/L)  Date Value  10/21/2023 139  04/20/2014 134 (L)    Height: 5\' 10"  (177.8 cm) Weight: 70 kg (154 lb 5.2 oz) IBW/kg (Calculated) : 73 Estimated Creatinine Clearance: 93.6 mL/min (by C-G formula based on SCr of 0.66 mg/dL).  Assessment  Kevin Esparza is a 62 y.o. male presenting with N / V / diarrhea. PMH significant for neuroendocrine carcinoma. Pharmacy has been consulted to monitor and replace electrolytes.  Diet: reg MIVF: N/A Pertinent medications: Patient has received multiple doses of IV and PO KCL to try to get to WNL  Goal of Therapy: Electrolytes within normal limits  Plan:  K 3.3     Will order Kcl 40 meq po x 2 doses F/u K @ 2000 Mag 1.8   Will order Magnesium sulfate 2 gm IV x1 Follow-up electrolytes with labs tomorrow AM  Thank you for allowing pharmacy to be a part of this patient's care.  Angelique Blonder, PharmD Clinical Pharmacist 10/21/2023 10:52 AM

## 2023-10-21 NOTE — Discharge Instructions (Signed)
Follow up with PCP and pick up medications from pharmacy. Make sure to take all antibiotics. Do not double up on narcotic/pain medication or drink alcohol during this time. Do not drive while taking narcotic medication. Call 911 or return to ER for life threatening issues or other concerns.

## 2023-10-21 NOTE — Progress Notes (Signed)
Nsg Discharge Note  Admit Date:  10/17/2023 Discharge date: 10/21/2023   Eluzer Wares to be D/C'd Home per MD order.  AVS completed.  Copy for chart, and copy for patient signed, and dated. Patient/caregiver able to verbalize understanding.  Discharge Medication: Allergies as of 10/21/2023   No Known Allergies      Medication List     STOP taking these medications    atorvastatin 40 MG tablet Commonly known as: LIPITOR   cyclobenzaprine 10 MG tablet Commonly known as: FLEXERIL   famotidine 20 MG tablet Commonly known as: PEPCID   finasteride 5 MG tablet Commonly known as: PROSCAR   sucralfate 1 g tablet Commonly known as: Carafate       TAKE these medications    acetaminophen 650 MG CR tablet Commonly known as: TYLENOL Take 1,300 mg by mouth See admin instructions. Take 1300 mg in the morning, may take a second 1300 mg dose during the day as needed for pain   albuterol 108 (90 Base) MCG/ACT inhaler Commonly known as: VENTOLIN HFA Inhale 2 puffs into the lungs every 6 (six) hours as needed for wheezing or shortness of breath.   amLODipine 5 MG tablet Commonly known as: NORVASC Take 1 tablet (5 mg total) by mouth daily.   apixaban 5 MG Tabs tablet Commonly known as: Eliquis Take 1 tablet (5 mg total) by mouth 2 (two) times daily. Start taking on: October 22, 2023   colchicine 0.6 MG tablet Take 0.6 mg by mouth 2 (two) times daily as needed (gout).   enalapril 10 MG tablet Commonly known as: VASOTEC Take 1 tablet (10 mg total) by mouth daily.   gabapentin 300 MG capsule Commonly known as: NEURONTIN Take 300 mg p.o. every morning, take 600 mg p.o. nightly   meclizine 25 MG tablet Commonly known as: ANTIVERT Take 25 mg by mouth every 6 (six) hours as needed.   metFORMIN 1000 MG tablet Commonly known as: GLUCOPHAGE Take 1,000 mg by mouth daily with breakfast.   metoCLOPramide 10 MG tablet Commonly known as: REGLAN Take 1 tablet (10 mg total)  by mouth every 6 (six) hours as needed.   metoprolol succinate 25 MG 24 hr tablet Commonly known as: Toprol XL Take 1 tablet (25 mg total) by mouth every evening.   omeprazole 20 MG capsule Commonly known as: PRILOSEC Take 20 mg by mouth 2 (two) times daily.   ondansetron 8 MG tablet Commonly known as: ZOFRAN Take 8 mg by mouth 3 (three) times daily.   tamsulosin 0.4 MG Caps capsule Commonly known as: FLOMAX Take 1 capsule (0.4 mg total) by mouth daily.   tiZANidine 2 MG tablet Commonly known as: ZANAFLEX Take 2 mg by mouth 3 (three) times daily.        Discharge Assessment: Vitals:   10/21/23 0816 10/21/23 1111  BP: 129/69 125/60  Pulse: 68 73  Resp: 18 16  Temp: 98.5 F (36.9 C) 97.8 F (36.6 C)  SpO2: 98% 98%   Skin clean, dry and intact without evidence of skin break down, no evidence of skin tears noted. IV catheter discontinued intact. Site without signs and symptoms of complications - no redness or edema noted at insertion site, patient denies c/o pain - only slight tenderness at site.  Dressing with slight pressure applied.  D/c Instructions-Education: Discharge instructions given to patient/family with verbalized understanding. D/c education completed with patient/family including follow up instructions, medication list, d/c activities limitations if indicated, with other d/c instructions as indicated  by MD - patient able to verbalize understanding, all questions fully answered. Patient instructed to return to ED, call 911, or call MD for any changes in condition.  Patient escorted via WC, and D/C home via private auto.  Adair Laundry, RN 10/21/2023 12:32 PM

## 2023-10-22 NOTE — Discharge Summary (Signed)
Physician Discharge Summary   Patient: Kevin Esparza MRN: [id number] DOB: 1961/03/28  Admit date:     10/17/2023  Discharge date: 10/21/2023  Discharge Physician: Delfino Lovett   PCP: Shane Crutch, PA   Recommendations at discharge:    F/up with outpt providers as requested  Discharge Diagnoses: Principal Problem:   Nausea vomiting and diarrhea Active Problems:   Colitis   HTN (hypertension)   Hyperlipidemia   Atrial fibrillation (HCC)   Diabetes mellitus without complication (HCC)   Chronic diastolic CHF (congestive heart failure) (HCC)   Hypokalemia   Hypophosphatemia   BPH (benign prostatic hyperplasia)   Gout   Neuroendocrine carcinoma of stomach (HCC)   Malnutrition of moderate degree  Hospital Course: Assessment and Plan:  62 y.o. male with medical history significant of stomach neuroendocrine carcinoma, HTN, HLD, DM,  dCHF, stroke, gout, BPH, A-fib on Eliquis, is admitted for nausea, vomiting, diarrhea    12/17: GI consult 12/18: aggressive K replacement,  stop zosyn 12/19: EGD/C-scope today, added Protonix IV   Nausea vomiting and diarrhea: Likely due to rectal colitis as evidenced by CT scan.  No fever or leukocytosis.  Clinically not septic.  Patient has history of stomach neuroendocrine carcinoma.  Colitis ruled out - GI seen, on 12/19 EGD - Widely patent Schatzki ring, c-scope with multiple polyps, removed few Patient reports on and off diarrhea for over 20 years with no conclusive diagnosis.  Outpt GI f/up - symptoms improved -neg C. difficile and GI pathogen panel -Procalcitonin less than 0.1 - stop zosyn   HTN (hypertension): Blood pressure 123/64 on admission   Hyperlipidemia -Lipitor   Atrial fibrillation (HCC): Heart rate 80-90s -Metoprolol -hold Eliquis for 2 days post-EGD,c-scope. Resume after that   Diabetes mellitus without complication [facility]): Recent A1c 6.0, well-controlled.     Chronic diastolic CHF (congestive heart failure) (HCC): 2D  echo 07/27/2022 showed EF of 60 to 65%.  Patient is clinically dry. - BNP 23   Hypokalemia and Hypophosphatemia:  -Repleted aggressively by pharmacy team    BPH (benign prostatic hyperplasia) Gout    History of neuroendocrine carcinoma of stomach (HCC): Patient had EGD on 05/13/2022 which showed no gross lesion in esophagus, a nonobstructive Schatzki's ring and left gastric esophageal junction and hiatal hernia. - GI seen - on 12/19 EGD - Widely patent Schatzki ring, c-scope with multiple polyps, removed few   Moderate Malnutrition related to chronic illness as evidenced by mild fat depletion, moderate fat depletion, mild muscle depletion.  Dietitian seen        Consultants: GI Procedures performed: EGD & C-scope  Disposition: Home Diet recommendation:  Discharge Diet Orders (From admission, onward)     Start     Ordered   10/21/23 0000  Diet - low sodium heart healthy        10/21/23 1203           Carb modified diet DISCHARGE MEDICATION: Allergies as of 10/21/2023   No Known Allergies      Medication List     STOP taking these medications    atorvastatin 40 MG tablet Commonly known as: LIPITOR   cyclobenzaprine 10 MG tablet Commonly known as: FLEXERIL   famotidine 20 MG tablet Commonly known as: PEPCID   finasteride 5 MG tablet Commonly known as: PROSCAR   sucralfate 1 g tablet Commonly known as: Carafate       TAKE these medications    acetaminophen 650 MG CR tablet Commonly known as: TYLENOL Take 1,300 mg by  mouth See admin instructions. Take 1300 mg in the morning, may take a second 1300 mg dose during the day as needed for pain   albuterol 108 (90 Base) MCG/ACT inhaler Commonly known as: VENTOLIN HFA Inhale 2 puffs into the lungs every 6 (six) hours as needed for wheezing or shortness of breath.   amLODipine 5 MG tablet Commonly known as: NORVASC Take 1 tablet (5 mg total) by mouth daily.   apixaban 5 MG Tabs tablet Commonly known  as: Eliquis Take 1 tablet (5 mg total) by mouth 2 (two) times daily.   colchicine 0.6 MG tablet Take 0.6 mg by mouth 2 (two) times daily as needed (gout).   enalapril 10 MG tablet Commonly known as: VASOTEC Take 1 tablet (10 mg total) by mouth daily.   gabapentin 300 MG capsule Commonly known as: NEURONTIN Take 300 mg p.o. every morning, take 600 mg p.o. nightly   meclizine 25 MG tablet Commonly known as: ANTIVERT Take 25 mg by mouth every 6 (six) hours as needed.   metFORMIN 1000 MG tablet Commonly known as: GLUCOPHAGE Take 1,000 mg by mouth daily with breakfast.   metoCLOPramide 10 MG tablet Commonly known as: REGLAN Take 1 tablet (10 mg total) by mouth every 6 (six) hours as needed.   metoprolol succinate 25 MG 24 hr tablet Commonly known as: Toprol XL Take 1 tablet (25 mg total) by mouth every evening.   omeprazole 20 MG capsule Commonly known as: PRILOSEC Take 20 mg by mouth 2 (two) times daily.   ondansetron 8 MG tablet Commonly known as: ZOFRAN Take 8 mg by mouth 3 (three) times daily.   tamsulosin 0.4 MG Caps capsule Commonly known as: FLOMAX Take 1 capsule (0.4 mg total) by mouth daily.   tiZANidine 2 MG tablet Commonly known as: ZANAFLEX Take 2 mg by mouth 3 (three) times daily.        Follow-up Information     Shane Crutch, [place] Follow up on 11/03/2023.   Specialty: Family Medicine Why: [facility] Discharge F/UP. Go at 10:20am. Contact information: [address] [place] [address] [phone]         Regis Bill, MD. Schedule an appointment as soon as possible for a visit in 2 week(s).   Specialty: Gastroenterology Why: [facility] Discharge F/UP. Please call your office for your follow up. Contact information: [address] [place] [address] [phone]                Discharge Exam: Ceasar Mons Weights   10/20/23 0411 10/20/23 1305 10/21/23 0425  Weight: 71 kg 70.8 kg 70 kg   General exam:  Appears calm and comfortable , malnourished Respiratory system: Clear to auscultation. Respiratory effort normal. Cardiovascular system: S1 & S2 heard, RRR. No JVD, murmurs, rubs, gallops or clicks. No pedal edema. Gastrointestinal system: Abdomen is nondistended, soft and nontender. No organomegaly or masses felt. Normal bowel sounds heard. Central nervous system: Alert and oriented. No focal neurological deficits. Extremities: Symmetric 5 x 5 power.  Has some right knee tenderness which has been chronic issue Skin: No rashes, lesions or ulcers Psychiatry: Judgement and insight appear normal. Mood & affect appropriate.   Condition at discharge: good  The results of significant diagnostics from this hospitalization (including imaging, microbiology, ancillary and laboratory) are listed below for reference.   Imaging Studies: CT ENTERO ABD/PELVIS W CONTAST Result Date: 10/21/2023 CLINICAL DATA:  Nausea, vomiting and diarrhea. Evaluate for submucosal lesions in the small bowel. EXAM:  CT ABDOMEN AND PELVIS WITH CONTRAST (ENTEROGRAPHY) TECHNIQUE: Multidetector CT of the abdomen and pelvis during bolus administration of intravenous contrast. Negative oral contrast was given. RADIATION DOSE REDUCTION: This exam was performed according to the departmental dose-optimization program which includes automated exposure control, adjustment of the mA and/or kV according to patient size and/or use of iterative reconstruction technique. CONTRAST:  OMNIPAQUE IOHEXOL 300 MG/ML  SOLN COMPARISON:  Abdominopelvic CT 10/17/2023 and 09/17/2023. FINDINGS: Lower chest: Clear lung bases. No significant pleural or pericardial effusion. Hepatobiliary: The liver is normal in density without suspicious focal abnormality. No biliary dilatation status post cholecystectomy. Pancreas: Unremarkable. No pancreatic ductal dilatation or surrounding inflammatory changes. Spleen: The spleen measures 15.3 x 12.9 x 4.9 cm (volume = 510  cm^3) consistent with mild splenomegaly, unchanged. No focal abnormality identified. Adrenals/Urinary Tract: Stable 1 cm left adrenal nodule consistent with a benign finding as discussed on recent prior examination. The right adrenal gland appears normal. No evidence of urinary tract calculus, suspicious renal lesion or hydronephrosis. Unchanged small renal cysts bilaterally for which no specific follow-up imaging is recommended. The bladder appears unremarkable for its degree of distention. Stomach/Bowel: There is improved distension of the small and large bowel compared with the recent prior study. There is a stable metallic foreign body along the anterior wall of the stomach, presumably a surgical/endoscopic clip. The stomach otherwise appears unremarkable for its degree of distention. No evidence of bowel wall thickening, mucosal hyperenhancement, submucosal lesion or surrounding inflammation. Retrocecal surgical clips consistent with prior appendectomy. Vascular/Lymphatic: There are no enlarged abdominal or pelvic lymph nodes. Age advanced aortic and branch vessel atherosclerosis without evidence of aneurysm or large vessel occlusion. The portal, superior mesenteric and splenic veins are patent. Reproductive: The prostate gland is mildly enlarged with central heterogeneity. Other: No evidence of abdominal wall mass or hernia. No ascites or pneumoperitoneum. Musculoskeletal: No acute or significant osseous findings. No evidence of sacroiliitis. IMPRESSION: 1. No evidence of bowel wall thickening, mucosal hyperenhancement, submucosal lesion or surrounding inflammation. Previous appendectomy. 2. Stable mild chronic splenomegaly and prostatomegaly. 3. No acute findings. 4.  Aortic Atherosclerosis (ICD10-I70.0). Electronically Signed   By: Carey Bullocks M.D.   On: 10/21/2023 11:48   CT ABDOMEN PELVIS W CONTRAST Result Date: 10/17/2023 CLINICAL DATA:  Vomiting for 1 month. Clinical concern for bowel  obstruction. EXAM: CT ABDOMEN AND PELVIS WITH CONTRAST TECHNIQUE: Multidetector CT imaging of the abdomen and pelvis was performed using the standard protocol following bolus administration of intravenous contrast. RADIATION DOSE REDUCTION: This exam was performed according to the departmental dose-optimization program which includes automated exposure control, adjustment of the mA and/or kV according to patient size and/or use of iterative reconstruction technique. CONTRAST:  OMNIPAQUE IOHEXOL 300 MG/ML  SOLN COMPARISON:  09/17/2023 FINDINGS: Lower chest: Unremarkable. Hepatobiliary: No suspicious focal abnormality within the liver parenchyma. Cholecystectomy. No intrahepatic or extrahepatic biliary dilation. Pancreas: No focal mass lesion. No dilatation of the main duct. No intraparenchymal cyst. No peripancreatic edema. Spleen: No splenomegaly. No suspicious focal mass lesion. Adrenals/Urinary Tract: Right adrenal gland unremarkable. Stable 10 mm left adrenal nodule also unchanged when comparing back to a study from 02/07/2007, consistent with benign etiology such as adenoma. No followup imaging is recommended. Tiny well-defined homogeneous low-density lesions in both kidneys are too small to characterize but are statistically most likely benign and probably cysts. No followup imaging is recommended. No evidence for hydroureter. The urinary bladder appears normal for the degree of distention. Stomach/Bowel: Stomach is unremarkable. No gastric  wall thickening. No evidence of outlet obstruction. Duodenum is normally positioned as is the ligament of Treitz. No small bowel wall thickening. No small bowel dilatation. The terminal ileum is normal. The appendix is not well visualized, but there is no edema or inflammation in the region of the cecal tip to suggest appendicitis. Colon is decompressed which accentuates wall thickness, but the wall of the cecum and ascending colon is ill-defined raising the question  of mural edema. Vascular/Lymphatic: There is moderate atherosclerotic calcification of the abdominal aorta without aneurysm. There is no gastrohepatic or hepatoduodenal ligament lymphadenopathy. No retroperitoneal or mesenteric lymphadenopathy. No pelvic sidewall lymphadenopathy. Reproductive: Prostate gland is enlarged. Other: No intraperitoneal free fluid. Musculoskeletal: Stable sclerotic focus right iliac crest, likely a bone island IMPRESSION: 1. Colon is decompressed which accentuates wall thickness, but the wall of the cecum and ascending colon is ill-defined raising the question of mural edema. While not definite, imaging features could be related to right-sided colitis. 2. No evidence for small bowel obstruction. 3. Prostatomegaly. 4.  Aortic Atherosclerosis (ICD10-I70.0). Electronically Signed   By: Kennith Center M.D.   On: 10/17/2023 18:18    Microbiology: Results for orders placed or performed during the hospital encounter of 10/17/23  C Difficile Quick Screen w PCR reflex     Status: None   Collection Time: 10/18/23  8:05 AM   Specimen: STOOL  Result Value Ref Range Status   C Diff antigen NEGATIVE NEGATIVE Final   C Diff toxin NEGATIVE NEGATIVE Final   C Diff interpretation No C. difficile detected.  Final    Comment: Performed at [facility], [address] [place], [place] [address]  Gastrointestinal Panel by PCR , Stool     Status: None   Collection Time: 10/18/23  8:45 AM   Specimen: STOOL  Result Value Ref Range Status   Campylobacter species NOT DETECTED NOT DETECTED Final   Plesimonas shigelloides NOT DETECTED NOT DETECTED Final   Salmonella species NOT DETECTED NOT DETECTED Final   Yersinia enterocolitica NOT DETECTED NOT DETECTED Final   Vibrio species NOT DETECTED NOT DETECTED Final   Vibrio cholerae NOT DETECTED NOT DETECTED Final   Enteroaggregative E coli (EAEC) NOT DETECTED NOT DETECTED Final   Enteropathogenic E coli (EPEC) NOT DETECTED NOT DETECTED  Final   Enterotoxigenic E coli (ETEC) NOT DETECTED NOT DETECTED Final   Shiga like toxin producing E coli (STEC) NOT DETECTED NOT DETECTED Final   Shigella/Enteroinvasive E coli (EIEC) NOT DETECTED NOT DETECTED Final   Cryptosporidium NOT DETECTED NOT DETECTED Final   Cyclospora cayetanensis NOT DETECTED NOT DETECTED Final   Entamoeba histolytica NOT DETECTED NOT DETECTED Final   Giardia lamblia NOT DETECTED NOT DETECTED Final   Adenovirus F40/41 NOT DETECTED NOT DETECTED Final   Astrovirus NOT DETECTED NOT DETECTED Final   Norovirus GI/GII NOT DETECTED NOT DETECTED Final   Rotavirus A NOT DETECTED NOT DETECTED Final   Sapovirus (I, II, IV, and V) NOT DETECTED NOT DETECTED Final    Comment: Performed at [facility], [address] [place], [place] [address]    Labs: CBC: Recent Labs  Lab 10/17/23 [id number] 10/18/23 0459 10/19/23 0539 10/20/23 0458 10/21/23 0433  WBC 6.5 5.0 5.0 4.5 4.9  NEUTROABS 3.6  --   --   --   --   HGB 14.2 11.3* 11.9* 11.3* 10.8*  HCT 38.7* 31.2* 32.8* 31.9* 30.7*  MCV 78.2* 79.2* 79.8* 81.2 82.1  PLT 257 211 214 198 190   Basic  Metabolic Panel: Recent Labs  Lab 10/17/23 1527 10/18/23 0459 10/18/23 1607 10/19/23 0539 10/19/23 1247 10/19/23 2027 10/20/23 0458 10/20/23 1839 10/21/23 0433  NA  --  135  --  139 140  --  139  --  139  K  --  <2.0*   < > 2.5* 2.8* 2.7* 3.1* 3.3* 3.3*  CL  --  99  --  99 104  --  106  --  102  CO2  --  27  --  30 29  --  26  --  27  GLUCOSE  --  107*  --  122* 81  --  104*  --  114*  BUN  --  <5*  --  <5* <5*  --  <5*  --  <5*  CREATININE  --  0.73  --  0.66 0.76  --  0.69  --  0.66  CALCIUM  --  7.3*  --  7.4* 7.5*  --  7.5*  --  7.7*  MG 1.8 1.8  --  2.0  --   --  1.9  --  1.8  PHOS 2.0* 4.0  --  3.4  --   --  2.8  --  3.6   < > = values in this interval not displayed.   Liver Function Tests: Recent Labs  Lab 10/17/23 0942 10/19/23 0539  AST 20 16  ALT 12 11  ALKPHOS 72 59  BILITOT 1.1 1.0   PROT 6.1* 5.2*  ALBUMIN 3.0* 2.5*   CBG: Recent Labs  Lab 10/20/23 1102 10/20/23 1536 10/20/23 2041 10/21/23 0800 10/21/23 1112  GLUCAP 98 148* 132* 122* 142*    Discharge time spent: greater than 30 minutes.  Signed: Delfino Lovett, MD [facility] 10/22/2023

## 2023-11-01 ENCOUNTER — Other Ambulatory Visit: Payer: Self-pay

## 2023-11-01 ENCOUNTER — Telehealth: Payer: Self-pay

## 2023-11-01 NOTE — Telephone Encounter (Signed)
-----   Message from Elida CHRISTELLA Shawl sent at 11/01/2023  5:54 AM EST ----- Regarding: RE: F/U Hi Dr.  Ole, Thanks for letting me know.  Kevin Esparza, pls contact patient and schedule him for a follow up regarding recent hospitalization for N/V/D, s/p EGD/colonoscopy. THX. ----- Message ----- From: Maryruth Ole DASEN, MD Sent: 10/28/2023   3:09 PM EST To: Elida CHRISTELLA Shawl, NP Subject: F/U                                            Kevin Esparza Elida, this guy was in the hospital and we did an EGD/Colon. Had a lot of polyps. I know he was seen by you guys so he probably needs f/u again at some point. Let me know if I can be of help in any way.  Thanks,  Dr. Maryruth

## 2023-11-01 NOTE — Telephone Encounter (Signed)
 Pt was made aware of Kevin Evener NP recommendations: Pt was scheduled to see Kevin Evener NP on 01/02/2024 at 9:00 AM. Pt made aware. Pt verbalized understanding with all questions answered.

## 2023-11-11 ENCOUNTER — Other Ambulatory Visit: Payer: Medicare HMO

## 2023-11-11 DIAGNOSIS — Z87898 Personal history of other specified conditions: Secondary | ICD-10-CM

## 2023-11-12 LAB — PSA: Prostate Specific Ag, Serum: 2.8 ng/mL (ref 0.0–4.0)

## 2023-11-15 ENCOUNTER — Encounter: Payer: Self-pay | Admitting: Urology

## 2023-11-15 ENCOUNTER — Ambulatory Visit: Payer: Medicare HMO | Admitting: Urology

## 2023-11-15 VITALS — BP 149/83 | HR 94 | Ht 70.0 in | Wt 154.0 lb

## 2023-11-15 DIAGNOSIS — Z87898 Personal history of other specified conditions: Secondary | ICD-10-CM | POA: Diagnosis not present

## 2023-11-15 DIAGNOSIS — N401 Enlarged prostate with lower urinary tract symptoms: Secondary | ICD-10-CM | POA: Diagnosis not present

## 2023-11-15 DIAGNOSIS — N138 Other obstructive and reflux uropathy: Secondary | ICD-10-CM | POA: Diagnosis not present

## 2023-11-15 LAB — BLADDER SCAN AMB NON-IMAGING: Scan Result: 22

## 2023-11-15 MED ORDER — FINASTERIDE 5 MG PO TABS: 5.0000 mg | ORAL_TABLET | Freq: Every day | ORAL | 11 refills | Status: DC

## 2023-11-15 NOTE — Progress Notes (Signed)
 I,Amy L Pierron,acting as a scribe for Rosina Riis, MD.,have documented all relevant documentation on the behalf of Rosina Riis, MD,as directed by  Rosina Riis, MD while in the presence of Rosina Riis, MD.  11/15/2023 2:22 PM   Ryan Charleston [facility] [birth year] [id number]  Referring provider: Donnie Handing, PA [address] [place],  [place] [address]  Chief Complaint  Patient presents with   Benign Prostatic Hypertrophy    HPI: 63 year-old male with a personal history of BPH presents today for a six month follow-up.  He underwent cystoscopy that showed mild trabeculation, no discreet median lobe, and a prostate volume of 86 grams. He's managed on Flomax . Previously discussed consideration of outlet procedure, including Urolift, TURP, and Holep. At that point in time, he was started on Finasteride  for dual therapy.   He was hospitalized in December for nausea, vomiting, and diarrhea.  His most recent PSA on 11/11/2023 is 2.8, which has not declined since initiation of Finasteride .  He mentions that he was sick starting in November. He never got a definite answer as to what was going on.   He was taking Finasteride  until he get sick recently. But he started it back since home from the hospital and he thinks it has been helping with his urinary symptoms.   IPSS     Row Name 11/15/23 0900         International Prostate Symptom Score   How often have you had the sensation of not emptying your bladder? Less than 1 in 5     How often have you had to urinate less than every two hours? Less than 1 in 5 times     How often have you found you stopped and started again several times when you urinated? Less than half the time     How often have you found it difficult to postpone urination? Less than 1 in 5 times     How often have you had a weak urinary stream? Less than 1 in 5 times     How often have you had to strain to start urination? Not at All     How many times  did you typically get up at night to urinate? 1 Time     Total IPSS Score 7       Quality of Life due to urinary symptoms   If you were to spend the rest of your life with your urinary condition just the way it is now how would you feel about that? Pleased            Score:  1-7 Mild 8-19 Moderate 20-35 Severe Results for orders placed or performed in visit on 11/15/23  Bladder Scan (Post Void Residual) in office  Result Value Ref Range   Scan Result 22      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   Pneumonia    several times   Stomach cancer (HCC)    Stroke (  [facility])     Surgical History: Past Surgical History:  Procedure Laterality Date   APPENDECTOMY  2005   BIOPSY  03/19/2021   Procedure: BIOPSY;  Surgeon: Wilhelmenia Aloha Raddle., MD;  Location: [facility] ENDOSCOPY;  Service: Gastroenterology;;   BIOPSY  05/13/2022   Procedure: BIOPSY;  Surgeon: Wilhelmenia Aloha Raddle., MD;  Location: [facility] ENDOSCOPY;  Service: Gastroenterology;;   BIOPSY  10/20/2023   Procedure: BIOPSY;  Surgeon: Maryruth Ole DASEN, MD;  Location: [facility] ENDOSCOPY;  Service: Endoscopy;;   CERVICAL SPINE SURGERY  1997   c4-c7   CHOLECYSTECTOMY  2005   COLONOSCOPY WITH PROPOFOL  N/A 12/31/2020   Procedure: COLONOSCOPY WITH PROPOFOL ;  Surgeon: Janalyn Keene NOVAK, MD;  Location: [facility] ENDOSCOPY;  Service: Endoscopy;  Laterality: N/A;   COLONOSCOPY WITH PROPOFOL  N/A 03/19/2021   Procedure: COLONOSCOPY WITH PROPOFOL ;  Surgeon: Wilhelmenia Aloha Raddle., MD;  Location: [facility] ENDOSCOPY;  Service: Gastroenterology;   Laterality: N/A;   COLONOSCOPY WITH PROPOFOL  N/A 05/13/2022   Procedure: COLONOSCOPY WITH PROPOFOL ;  Surgeon: Mansouraty, Aloha Raddle., MD;  Location: [facility] ENDOSCOPY;  Service: Gastroenterology;  Laterality: N/A;   COLONOSCOPY WITH PROPOFOL  N/A 10/20/2023   Procedure: COLONOSCOPY WITH PROPOFOL ;  Surgeon: Maryruth Ole DASEN, MD;  Location: [facility] ENDOSCOPY;  Service: Endoscopy;  Laterality: N/A;   ENDOSCOPIC MUCOSAL RESECTION  03/19/2021   Procedure: ENDOSCOPIC MUCOSAL RESECTION;  Surgeon: Wilhelmenia Aloha Raddle., MD;  Location: [facility] ENDOSCOPY;  Service: Gastroenterology;;   ENDOSCOPIC MUCOSAL RESECTION N/A 05/13/2022   Procedure: ENDOSCOPIC MUCOSAL RESECTION;  Surgeon: Wilhelmenia Aloha Raddle., MD;  Location: [facility] ENDOSCOPY;  Service: Gastroenterology;  Laterality: N/A;   ESOPHAGOGASTRODUODENOSCOPY (EGD) WITH PROPOFOL  N/A 12/31/2020   Procedure: ESOPHAGOGASTRODUODENOSCOPY (EGD) WITH PROPOFOL ;  Surgeon: Janalyn Keene NOVAK, MD;  Location: [facility] ENDOSCOPY;  Service: Endoscopy;  Laterality: N/A;   ESOPHAGOGASTRODUODENOSCOPY (EGD) WITH PROPOFOL  N/A 03/19/2021   Procedure: ESOPHAGOGASTRODUODENOSCOPY (EGD) WITH PROPOFOL ;  Surgeon: Wilhelmenia Aloha Raddle., MD;  Location: [facility] ENDOSCOPY;  Service: Gastroenterology;  Laterality: N/A;   ESOPHAGOGASTRODUODENOSCOPY (EGD) WITH PROPOFOL  N/A 05/13/2022   Procedure: ESOPHAGOGASTRODUODENOSCOPY (EGD) WITH PROPOFOL ;  Surgeon: Wilhelmenia Aloha Raddle., MD;  Location: [facility] ENDOSCOPY;  Service: Gastroenterology;  Laterality: N/A;   ESOPHAGOGASTRODUODENOSCOPY (EGD) WITH PROPOFOL  N/A 10/20/2023   Procedure: ESOPHAGOGASTRODUODENOSCOPY (EGD) WITH PROPOFOL ;  Surgeon: Maryruth Ole DASEN, MD;  Location: [facility] ENDOSCOPY;  Service: Endoscopy;  Laterality: N/A;   HEMOSTASIS CLIP PLACEMENT  03/19/2021   Procedure: HEMOSTASIS CLIP PLACEMENT;  Surgeon: Wilhelmenia Aloha Raddle., MD;  Location: [facility] ENDOSCOPY;  Service: Gastroenterology;;   HEMOSTASIS CLIP PLACEMENT  05/13/2022   Procedure: HEMOSTASIS CLIP  PLACEMENT;  Surgeon: Wilhelmenia Aloha Raddle., MD;  Location: [facility] ENDOSCOPY;  Service: Gastroenterology;;   MULTIPLE TOOTH EXTRACTIONS     dentures   POLYPECTOMY  03/19/2021   Procedure: POLYPECTOMY;  Surgeon: Wilhelmenia Aloha Raddle., MD;  Location: [facility] ENDOSCOPY;  Service: Gastroenterology;;   POLYPECTOMY  05/13/2022   Procedure: POLYPECTOMY;  Surgeon: Wilhelmenia Aloha Raddle., MD;  Location: [facility] ENDOSCOPY;  Service: Gastroenterology;;  EGD and colon   POLYPECTOMY  10/20/2023   Procedure: POLYPECTOMY;  Surgeon: Maryruth Ole DASEN, MD;  Location: [facility] ENDOSCOPY;  Service: Endoscopy;;   SUBMUCOSAL LIFTING INJECTION  03/19/2021   Procedure: SUBMUCOSAL LIFTING INJECTION;  Surgeon: Wilhelmenia Aloha Raddle., MD;  Location: [facility] ENDOSCOPY;  Service: Gastroenterology;;   SUBMUCOSAL LIFTING INJECTION  05/13/2022   Procedure: SUBMUCOSAL LIFTING INJECTION;  Surgeon: Wilhelmenia Aloha Raddle., MD;  Location: [facility] ENDOSCOPY;  Service: Gastroenterology;;   SUBMUCOSAL TATTOO INJECTION  05/13/2022   Procedure: SUBMUCOSAL TATTOO INJECTION;  Surgeon: Wilhelmenia Aloha Raddle., MD;  Location: [facility] ENDOSCOPY;  Service: Gastroenterology;;   TONSILLECTOMY     UPPER ESOPHAGEAL ENDOSCOPIC ULTRASOUND (EUS) N/A 03/19/2021   Procedure: UPPER ESOPHAGEAL ENDOSCOPIC ULTRASOUND (EUS);  Surgeon: Wilhelmenia Aloha Raddle., MD;  Location: [facility] ENDOSCOPY;  Service: Gastroenterology;  Laterality: N/A;    Home Medications:  Allergies as of 11/15/2023   No Known Allergies      Medication List        Accurate as of November 15, 2023  2:22 PM. If you have any questions, ask your nurse or doctor.          acetaminophen  650 MG CR tablet Commonly known as: TYLENOL  Take 1,300 mg by mouth See admin instructions. Take 1300 mg in the morning, may take a second 1300 mg dose during the day as needed for pain   albuterol  108 (90 Base) MCG/ACT inhaler Commonly known as: VENTOLIN  HFA Inhale 2 puffs into the lungs every 6 (six) hours as needed for  wheezing or shortness of breath.   amLODipine  5 MG tablet Commonly known as: NORVASC  Take 1 tablet (5 mg total) by mouth daily.   apixaban  5 MG Tabs tablet Commonly known as: Eliquis  Take 1 tablet (5 mg total) by mouth 2 (two) times daily.   colchicine  0.6 MG tablet Take 0.6 mg by mouth 2 (two) times daily as needed (gout).   enalapril  10 MG tablet Commonly known as: VASOTEC  Take 1 tablet (10 mg total) by mouth daily.   finasteride  5 MG tablet Commonly known as: PROSCAR  Take 1 tablet (5 mg total) by mouth daily. Started by: Rosina Riis   gabapentin  300 MG capsule Commonly known as: NEURONTIN  Take 300 mg p.o. every morning, take 600 mg p.o. nightly   meclizine  25 MG tablet Commonly known as: ANTIVERT  Take 25 mg by mouth every 6 (six) hours as needed.   metFORMIN 1000 MG tablet Commonly known as: GLUCOPHAGE Take 1,000 mg by mouth daily with breakfast.   metoCLOPramide  10 MG tablet Commonly known as: REGLAN  Take 1 tablet (10 mg total) by mouth every 6 (six) hours as needed.   metoprolol  succinate 25 MG 24 hr tablet Commonly known as: Toprol  XL Take 1 tablet (25 mg total) by mouth every evening.   omeprazole  20 MG capsule Commonly known as: PRILOSEC  Take 20 mg by mouth 2 (two) times daily.   ondansetron  8 MG tablet Commonly known as: ZOFRAN  Take 8 mg by mouth 3 (three) times daily.   tamsulosin  0.4 MG Caps capsule Commonly known as: FLOMAX  Take 1 capsule (0.4 mg total) by mouth daily.   tiZANidine  2 MG tablet Commonly known as: ZANAFLEX  Take 2 mg by mouth 3 (three) times daily.        Family History: Family History  Problem Relation Age of Onset   Hypertension Mother    Diabetes Mother    Osteoarthritis Mother    Osteoporosis Mother    Stroke Father    Cancer Father        unknown origin   Stomach cancer Maternal Grandmother    Kidney cancer Maternal Grandmother    Liver cancer Maternal Grandmother    Lung cancer Maternal Grandfather     Kidney disease Neg Hx    Prostate cancer Neg Hx    Bladder Cancer Neg Hx    Colon cancer Neg Hx    Esophageal cancer Neg Hx    Inflammatory bowel disease Neg Hx    Pancreatic cancer Neg Hx    Rectal cancer Neg Hx  Colon polyps Neg Hx     Social History:  reports that he quit smoking about 19 years ago. His smoking use included cigarettes. He started smoking about 33 years ago. His smokeless tobacco use includes chew. He reports that he does not currently use alcohol after a past usage of about 1.0 standard drink of alcohol per week. He reports that he does not currently use drugs after having used the following drugs: Marijuana.   Physical Exam: BP (!) 149/83   Pulse 94   Ht 5' 10 (1.778 m)   Wt 154 lb (69.9 kg)   BMI 22.10 kg/m   Constitutional:  Alert and oriented, No acute distress. HEENT: [place] AT, moist mucus membranes.  Trachea midline, no masses. Neurologic: Grossly intact, no focal deficits, moving all 4 extremities. Psychiatric: Normal mood and affect.   Assessment & Plan:    1. BPH with urinary obstruction  - Doing extremely well on Finasteride  and Flomax . Not interested in doing an outlet procedure. Plan to continue these medications and prescribe enough refills.  2.. Elevated PSA  - Technically his PSA is stable, but it should have declined since starting Finasteride . Given his recent prolonged illness will hold off on any intervention for now and recheck in 6 months. If his PSA is not trending downwards, consider prostate MRI versus biopsy.  Return in about 6 months (around 05/14/2024) for PSA.  I have reviewed the above documentation for accuracy and completeness, and I agree with the above.   Rosina Riis, MD   [facility] [address] [place], [place] [address] [phone]

## 2024-01-02 ENCOUNTER — Other Ambulatory Visit (INDEPENDENT_AMBULATORY_CARE_PROVIDER_SITE_OTHER)

## 2024-01-02 ENCOUNTER — Ambulatory Visit: Payer: Medicare HMO | Admitting: Nurse Practitioner

## 2024-01-02 ENCOUNTER — Encounter: Payer: Self-pay | Admitting: Nurse Practitioner

## 2024-01-02 VITALS — BP 128/78 | HR 91 | Ht 70.0 in | Wt 164.2 lb

## 2024-01-02 DIAGNOSIS — D649 Anemia, unspecified: Secondary | ICD-10-CM | POA: Diagnosis not present

## 2024-01-02 DIAGNOSIS — D509 Iron deficiency anemia, unspecified: Secondary | ICD-10-CM | POA: Diagnosis not present

## 2024-01-02 DIAGNOSIS — R1111 Vomiting without nausea: Secondary | ICD-10-CM

## 2024-01-02 DIAGNOSIS — K529 Noninfective gastroenteritis and colitis, unspecified: Secondary | ICD-10-CM

## 2024-01-02 DIAGNOSIS — R1310 Dysphagia, unspecified: Secondary | ICD-10-CM

## 2024-01-02 DIAGNOSIS — Z860101 Personal history of adenomatous and serrated colon polyps: Secondary | ICD-10-CM

## 2024-01-02 LAB — CBC WITH DIFFERENTIAL/PLATELET
Basophils Absolute: 0 10*3/uL (ref 0.0–0.1)
Basophils Relative: 0.4 % (ref 0.0–3.0)
Eosinophils Absolute: 0.1 10*3/uL (ref 0.0–0.7)
Eosinophils Relative: 2.3 % (ref 0.0–5.0)
HCT: 43.6 % (ref 39.0–52.0)
Hemoglobin: 14.8 g/dL (ref 13.0–17.0)
Lymphocytes Relative: 23 % (ref 12.0–46.0)
Lymphs Abs: 1.5 10*3/uL (ref 0.7–4.0)
MCHC: 33.8 g/dL (ref 30.0–36.0)
MCV: 81.1 fl (ref 78.0–100.0)
Monocytes Absolute: 0.9 10*3/uL (ref 0.1–1.0)
Monocytes Relative: 13.9 % — ABNORMAL HIGH (ref 3.0–12.0)
Neutro Abs: 3.9 10*3/uL (ref 1.4–7.7)
Neutrophils Relative %: 60.4 % (ref 43.0–77.0)
Platelets: 225 10*3/uL (ref 150.0–400.0)
RBC: 5.38 Mil/uL (ref 4.22–5.81)
RDW: 14.6 % (ref 11.5–15.5)
WBC: 6.5 10*3/uL (ref 4.0–10.5)

## 2024-01-02 LAB — COMPREHENSIVE METABOLIC PANEL
ALT: 13 U/L (ref 0–53)
AST: 15 U/L (ref 0–37)
Albumin: 4 g/dL (ref 3.5–5.2)
Alkaline Phosphatase: 61 U/L (ref 39–117)
BUN: 5 mg/dL — ABNORMAL LOW (ref 6–23)
CO2: 29 meq/L (ref 19–32)
Calcium: 9.6 mg/dL (ref 8.4–10.5)
Chloride: 101 meq/L (ref 96–112)
Creatinine, Ser: 0.86 mg/dL (ref 0.40–1.50)
GFR: 91.9 mL/min (ref >60.00)
Glucose, Bld: 121 mg/dL — ABNORMAL HIGH (ref 70–99)
Potassium: 3.1 meq/L — ABNORMAL LOW (ref 3.5–5.1)
Sodium: 141 meq/L (ref 135–145)
Total Bilirubin: 0.6 mg/dL (ref 0.2–1.2)
Total Protein: 6.9 g/dL (ref 6.0–8.3)

## 2024-01-02 LAB — IBC + FERRITIN
Ferritin: 15 ng/mL — ABNORMAL LOW (ref 22.0–322.0)
Iron: 50 ug/dL (ref 42–165)
Saturation Ratios: 14.5 % — ABNORMAL LOW (ref 20.0–50.0)
TIBC: 344.4 ug/dL (ref 250.0–450.0)
Transferrin: 246 mg/dL (ref 212.0–360.0)

## 2024-01-02 LAB — B12 AND FOLATE PANEL
Folate: 12.2 ng/mL (ref >5.9)
Vitamin B-12: 266 pg/mL (ref 211–911)

## 2024-01-02 LAB — LIPASE: Lipase: 22 U/L (ref 11.0–59.0)

## 2024-01-02 NOTE — Progress Notes (Signed)
 01/02/2024 Kevin Esparza [id number] 09-23-1961   Chief Complaint: Hospital follow up, vomiting   History of Present Illness:  Kevin Esparza is a 63 y.o. male with a pmh significant for hypertension, hyperlipidemia, atrial fibrillation on Eliquis, CHF, GERD, diabetes, mellitus type II, gastric NET and colon polyps. He is followed by Dr. Meridee Score. He presents today for follow up after being admitted to [facility] with intractable vomiting and hypokalemia.   He initially developed vomiting without nausea and diarrhea without abdominal pain the first week of November 2024.  His symptoms worsened therefore he presented to the ED 09/17/2023.  Labs in the ED showed a WBC count of 7.9.  Hemoglobin 14.6.  Hematocrit 42.6.  Platelet 218.  Sodium 134.  Potassium 2.6.  Glucose 171.  BUN < 5.  Creatinine 1.0.  Normal LFTs.  Lipase 34.  CTAP without acute intra-abdominal/pelvic pathology to explain his symptoms.  He received KCl 60 meq po, Ondansetron, Reglan and a GI cocktail and his symptoms abated.  He was discharged home.  He continued to have vomiting and diarrhea for the next 4 weeks and stated he could not keep any food or water down.  He presented to [facility] ED 10/17/2023 for further evaluation.  Labs in the ED showed a WBC count of 6.5.  Hemoglobin 14.2.  Platelet 257.  Potassium 2.1.  BUN < 5.  Creatinine 0.79.  Albumin 3.0.  Total bili 1.1.  Alk phos 72.  AST 20.  ALT 12.  Lipase 27.  HIV nonreactive.  CTAP with contrast showed the colon was decompressed with wall thickness in the cecum and ascending colon were ill-defined raising the question of mural edema concerning for possible right-sided colitis without evidence of SBO.  He received aggressive KCl replacement and was initially on Zosyn IV.  He underwent an EGD 10/20/2023 which showed a widely patent Schatzki's ring and an Endo Clip was found in the stomach otherwise was unremarkable.  Esophageal biopsies were suggestive of reflux.  He  underwent a colonoscopy on the same date, the preparation of the colon was inadequate and showed three 2 to 7 mm polyps were removed from the cecum, three 4 to 5 mm polyps were removed from the ascending colon, two 4 to 10 mm polyps were removed at the hepatic flexure and two 4 to 5 mm polyps were removed from the transverse colon.  Path report was consistent with tubular adenomatous polyps and random colon biopsies showed normal colonic mucosa without colitis or dysplasia.  A repeat colonoscopy in 6 months due to suboptimal prep was recommended. Small bowel enterography 10/21/2023 showed a normal small bowel with evidence of prior appendectomy with stable mild chronic splenomegaly and prostatomegaly.  He was discharged home 10/21/2023.  He endorsed having significant fatigue and generalized weakness which is gradually improved.  No further excessive vomiting but describes vomiting partially digested food and less frequently yellow bilious fluid which occurs once weekly.  He denies having preceding nausea and stated he abruptly vomits which usually occurs right after or 1 to 2 hours after eating.  He vomited x 3 on Tuesday 2/25 and x 1 on 2/28. No specific food triggers.  He describes feeling as if food just sits to the top of his stomach after each meal.  Infrequent heartburn.  he sometimes has difficulty swallowing liquids, water gets stuck in his throat/feels locked up for few seconds then the liquid either comes back up or goes down. Decreased oral intake.  He is eating  pudding, apples, oranges and some chicken.  He is drinking a protein drink most days.  No NSAID use.  He has chronic loose stools, some days passes 10 nonbloody blood like stools daily in the next day x 1.  He attributes to having chronic loose stools since he underwent a cholecystectomy in 2005. He takes Imodium as needed.  No bloody, black or oily stools.                             Latest Ref Rng & Units 10/21/2023    4:33 AM 10/20/2023     4:58 AM 10/19/2023    5:39 AM  CBC  WBC 4.0 - 10.5 K/uL 4.9  4.5  5.0   Hemoglobin 13.0 - 17.0 g/dL 04.5  40.9  81.1   Hematocrit 39.0 - 52.0 % 30.7  31.9  32.8   Platelets 150 - 400 K/uL 190  198  214         Latest Ref Rng & Units 10/21/2023    4:33 AM 10/20/2023    6:39 PM 10/20/2023    4:58 AM  CMP  Glucose 70 - 99 mg/dL 914   782   BUN 8 - 23 mg/dL <5   <5   Creatinine 9.56 - 1.24 mg/dL 2.13   0.86   Sodium 578 - 145 mmol/L 139   139   Potassium 3.5 - 5.1 mmol/L 3.3  3.3  3.1   Chloride 98 - 111 mmol/L 102   106   CO2 22 - 32 mmol/L 27   26   Calcium 8.9 - 10.3 mg/dL 7.7   7.5      Small bowel CT enterography 10/21/2023: 1. No evidence of bowel wall thickening, mucosal hyperenhancement, submucosal lesion or surrounding inflammation. Previous appendectomy. 2. Stable mild chronic splenomegaly and prostatomegaly. 3. No acute findings. 4. Aortic Atherosclerosis  CTAP with contrast 10/17/2023: FINDINGS: Lower chest: Unremarkable.   Hepatobiliary: No suspicious focal abnormality within the liver parenchyma. Cholecystectomy. No intrahepatic or extrahepatic biliary dilation.   Pancreas: No focal mass lesion. No dilatation of the main duct. No intraparenchymal cyst. No peripancreatic edema.   Spleen: No splenomegaly. No suspicious focal mass lesion.   Adrenals/Urinary Tract: Right adrenal gland unremarkable. Stable 10 mm left adrenal nodule also unchanged when comparing back to a study from 02/07/2007, consistent with benign etiology such as adenoma. No followup imaging is recommended. Tiny well-defined homogeneous low-density lesions in both kidneys are too small to characterize but are statistically most likely benign and probably cysts. No followup imaging is recommended. No evidence for hydroureter. The urinary bladder appears normal for the degree of distention.   Stomach/Bowel: Stomach is unremarkable. No gastric wall thickening. No evidence of outlet  obstruction. Duodenum is normally positioned as is the ligament of Treitz. No small bowel wall thickening. No small bowel dilatation. The terminal ileum is normal. The appendix is not well visualized, but there is no edema or inflammation in the region of the cecal tip to suggest appendicitis. Colon is decompressed which accentuates wall thickness, but the wall of the cecum and ascending colon is ill-defined raising the question of mural edema.   Vascular/Lymphatic: There is moderate atherosclerotic calcification of the abdominal aorta without aneurysm. There is no gastrohepatic or hepatoduodenal ligament lymphadenopathy. No retroperitoneal or mesenteric lymphadenopathy. No pelvic sidewall lymphadenopathy.   Reproductive: Prostate gland is enlarged.   Other: No intraperitoneal free fluid.   Musculoskeletal: Stable sclerotic focus  right iliac crest, likely a bone island   IMPRESSION: 1. Colon is decompressed which accentuates wall thickness, but the wall of the cecum and ascending colon is ill-defined raising the question of mural edema. While not definite, imaging features could be related to right-sided colitis. 2. No evidence for small bowel obstruction. 3. Prostatomegaly. 4.  Aortic Atherosclerosis    ECHO 07/27/2022:  Left ventricular ejection fraction, by estimation, is 60 to 65%. The left ventricle has normal function. The left ventricle has no regional wall motion abnormalities. Left ventricular diastolic parameters were normal. 1. Right ventricular systolic function is normal. The right ventricular size is normal. Tricuspid regurgitation signal is inadequate for assessing PA pressure. 2. The mitral valve is normal in structure. Mild mitral valve regurgitation. No evidence of mitral stenosis. 3. 4. Tricuspid valve regurgitation is mild to moderate. The aortic valve is tricuspid. There is mild thickening of the aortic valve. Aortic valve regurgitation is mild. Aortic valve  sclerosis is present, with no evidence of aortic valve stenosis. 5. The inferior vena cava is normal in size with greater than 50% respiratory variability, suggesting right atrial pressure of 3 mmHg.  Gastric emptying study 06/20/2007: IMPRESSION:The patient is exhibiting a delayed gastric emptying of  only 22% at 90 minutes with the normal expected value being at least 50%  by 90 minute  PAST GI PROCEDURES:  EGD 10/20/2023 at [facility]: - Widely patent Schatzki ring. - Normal mucosa was found in the entire esophagus. Biopsied. - An endoclip was found in the stomach. - Normal examined duodenum  Colonoscopy 10/20/2023 at [facility]: - Preparation of the colon was inadequate.  - The examined portion of the ileum was normal.  - Three 2 to 7 mm polyps in the cecum, removed with a cold snare. Resected and retrieved.  - Three 4 to 5 mm polyps in the ascending colon, removed with a cold snare. Resected and retrieved.  - Two 4 to 10 mm polyps at the hepatic flexure, removed with a cold snare. Resected and retrieved.  - Two 4 to 5 mm polyps in the transverse colon, removed with a cold snare. Resected and retrieved.  - Normal mucosa in the entire examined colon. Biopsied -Repeat colonoscopy in 6 months due to suboptimal prep     EGD 05/13/2022: - No gross lesions in esophagus. Non-obstructing Schatzki ring. Z-line regular, 39 cm from the incisors. - 3 cm hiatal hernia. - Texture changed mucosa in the greater curvature of the distal gastric body. Removed with mucosal resection. Clip (MR conditional) was placed. Tattooed laterally. - Nodule in the gastric antrum. Removed with snare polypectomy. - Erythematous mucosa in the stomach. Biopsied. - No gross lesions in the duodenal bulb, in the first portion of the duodenum and in the second portion of the duodenum   Colonoscopy 05/13/2022: - Hemorrhoids found on digital rectal exam. - The examined portion of the ileum was normal. - Post mucosectomy scar in the cecum.  This area suggested no evidence of recurrence at this time. - 11, 3 to 14 mm polyps in the rectum, in the transverse colon, in the ascending colon and in the cecum, removed with a cold snare. Resected and retrieved. - Normal mucosa in the entire examined colon otherwise. - Non-bleeding non-thrombosed external and internal hemorrhoids.   A. STOMACH, ANTRAL NODULE, BIOPSY:  Benign hyperplastic gastric polyp.  Negative for dysplasia and malignancy.   B. STOMACH, GREATER CURVE/ DISTAL BODY, BIOPSY:  Polypoid fragments of gastric mucosa with moderate vascular ectasia and  lymphoid aggregates in the lamina propria.  Negative for dysplasia and malignancy.  No submucosal lesion is identified.   C. STOMACH, RANDOM, BIOPSY:  Fragments of gastric mucosa with no significant microscopic  abnormalities.  H.pylori, intestinal metaplasia, atrophy and dysplasia are not  identified.   D. COLON, CECUM, ASCENDING, TRANSVERSE AND RECTUM, POLYPECTOMY:  Tubulovillous and tubular adenomas.  Negative for high-grade dysplasia.    March 2022 EGD - Mild Schatzki ring. Dilated. - Erythematous mucosa in the antrum. Biopsied. - A single submucosal papule (nodule) found in the stomach. Biopsied. - Nodular mucosa in the gastric antrum (lesser curvature). Biopsied. - A single mucosal papule (nodule) found in the stomach. Biopsied. - Normal duodenal bulb, second portion of the duodenum and examined duodenum. - Biopsies were obtained in the gastric body, at the incisura and in the gastric antrum.   March 2022 Colonoscopy - One 20 mm polyp in the cecum. Resection not attempted. Injected. - One 12 mm polyp in the ascending colon, removed using lift and cut and a hot snare. Resected and retrieved. Injected. Clip was placed. - One 12 mm polyp in the transverse colon, removed using injection-lift and a hot snare. Resected and retrieved. Injected. - One 10 mm polyp in the descending colon, removed with a jumbo cold  forceps. Resected and retrieved. - Five 5 to 8 mm polyps in the sigmoid colon and in the descending colon, removed with a cold snare. Resected and retrieved. - Non-bleeding internal hemorrhoids. - The examination was otherwise normal.   Pathology DIAGNOSIS:  A.  STOMACH; COLD BIOPSY:  - ANTRAL MUCOSA WITH REACTIVE AND FOCAL MILD CHRONIC GASTRITIS,  NON-SPECIFIC.  - IHC FOR H.PYLORI IS NEGATIVE.  - NEGATIVE FOR DYSPLASIA AND MALIGNANCY.  B.  STOMACH, INCISURA; COLD BIOPSY:  - FOCAL MILD CHRONIC GASTRITIS, NON-SPECIFIC.  - NEGATIVE FOR H. PYLORI, DYSPLASIA, AND MALIGNANCY. C.  STOMACH, POLYPOID AREA; COLD BIOPSY:  - OXYNTIC MUCOSA WITH FOCAL DENSE AREA OF CHRONIC GASTRITIS INVOLVING  THE DEEP ASPECT OF THE MUCOSA, SEE COMMENT.  - IHC FOR H.PYLORI IS NEGATIVE.  - NEGATIVE FOR DYSPLASIA AND MALIGNANCY.  D.  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  - SEE COMMENT.  E.  ESOPHAGUS; COLD BIOPSY:  - SQUAMOUS MUCOSA WITH CHANGES CONSISTENT WITH REFLUX ESOPHAGITIS. - NEGATIVE FOR DYSPLASIA AND MALIGNANCY. F.  COLON POLYP, ASCENDING; HOT SNARE:  - TUBULAR ADENOMA.  - NEGATIVE FOR HIGH-GRADE DYSPLASIA AND MALIGNANCY.  G.  COLON POLYP, TRANSVERSE; HOT SNARE:  - TUBULAR ADENOMA.  - NEGATIVE FOR HIGH-GRADE DYSPLASIA AND MALIGNANCY. H.  COLON POLYP X6, DESCENDING (2) AND SIGMOID (4); COLD SNARE AND COLD  BIOPSY:  - TUBULAR ADENOMA (MULTIPLE FRAGMENTS).  - NEGATIVE FOR HIGH-GRADE DYSPLASIA AND MALIGNANCY.   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 ENDOSCOPY;  Service: Gastroenterology;;   BIOPSY  05/13/2022   Procedure: BIOPSY;  Surgeon: Lemar Lofty., MD;  Location: [facility] ENDOSCOPY;  Service: Gastroenterology;;   BIOPSY  10/20/2023   Procedure: BIOPSY;  Surgeon: Regis Bill, MD;  Location: [facility] ENDOSCOPY;  Service: Endoscopy;;   CERVICAL SPINE SURGERY  1997   c4-c7   CHOLECYSTECTOMY  2005   COLONOSCOPY WITH PROPOFOL N/A 12/31/2020   Procedure: COLONOSCOPY WITH PROPOFOL;  Surgeon: Pasty Spillers, MD;  Location: [facility] ENDOSCOPY;  Service: Endoscopy;  Laterality: N/A;   COLONOSCOPY WITH PROPOFOL N/A 03/19/2021   Procedure: COLONOSCOPY WITH PROPOFOL;  Surgeon: Meridee Score Netty Starring., MD;  Location: [facility] ENDOSCOPY;  Service: Gastroenterology;  Laterality: N/A;   COLONOSCOPY WITH PROPOFOL N/A 05/13/2022   Procedure: COLONOSCOPY WITH PROPOFOL;  Surgeon: Meridee Score Netty Starring., MD;  Location: [facility] ENDOSCOPY;  Service: Gastroenterology;  Laterality: N/A;   COLONOSCOPY WITH PROPOFOL N/A 10/20/2023   Procedure: COLONOSCOPY WITH PROPOFOL;  Surgeon: Regis Bill, MD;  Location: [facility] ENDOSCOPY;  Service: Endoscopy;  Laterality: N/A;   ENDOSCOPIC MUCOSAL RESECTION  03/19/2021   Procedure: ENDOSCOPIC MUCOSAL RESECTION;  Surgeon: Meridee Score Netty Starring., MD;  Location: [facility] ENDOSCOPY;  Service: Gastroenterology;;   ENDOSCOPIC MUCOSAL RESECTION N/A 05/13/2022   Procedure: ENDOSCOPIC MUCOSAL RESECTION;  Surgeon: Lemar Lofty., MD;  Location: [facility] ENDOSCOPY;  Service: Gastroenterology;  Laterality: N/A;   ESOPHAGOGASTRODUODENOSCOPY (EGD) WITH PROPOFOL N/A 12/31/2020   Procedure: ESOPHAGOGASTRODUODENOSCOPY (EGD) WITH PROPOFOL;  Surgeon: Pasty Spillers, MD;   Location: [facility] ENDOSCOPY;  Service: Endoscopy;  Laterality: N/A;   ESOPHAGOGASTRODUODENOSCOPY (EGD) WITH PROPOFOL N/A 03/19/2021   Procedure: ESOPHAGOGASTRODUODENOSCOPY (EGD) WITH PROPOFOL;  Surgeon: Meridee Score Netty Starring., MD;  Location: [facility] ENDOSCOPY;  Service: Gastroenterology;  Laterality: N/A;   ESOPHAGOGASTRODUODENOSCOPY (EGD) WITH PROPOFOL N/A 05/13/2022   Procedure: ESOPHAGOGASTRODUODENOSCOPY (EGD) WITH PROPOFOL;  Surgeon: Meridee Score Netty Starring., MD;  Location: [facility] ENDOSCOPY;  Service: Gastroenterology;  Laterality: N/A;   ESOPHAGOGASTRODUODENOSCOPY (EGD) WITH PROPOFOL N/A 10/20/2023   Procedure: ESOPHAGOGASTRODUODENOSCOPY (EGD) WITH PROPOFOL;  Surgeon: Regis Bill, MD;  Location: [facility] ENDOSCOPY;  Service: Endoscopy;  Laterality: N/A;   HEMOSTASIS CLIP PLACEMENT  03/19/2021   Procedure: HEMOSTASIS CLIP PLACEMENT;  Surgeon: Lemar Lofty., MD;  Location: [facility] ENDOSCOPY;  Service: Gastroenterology;;   HEMOSTASIS CLIP PLACEMENT  05/13/2022   Procedure: HEMOSTASIS CLIP PLACEMENT;  Surgeon: Lemar Lofty., MD;  Location: [facility] ENDOSCOPY;  Service: Gastroenterology;;   MULTIPLE TOOTH EXTRACTIONS     dentures   POLYPECTOMY  03/19/2021   Procedure: POLYPECTOMY;  Surgeon: Lemar Lofty., MD;  Location: [facility] ENDOSCOPY;  Service: Gastroenterology;;   POLYPECTOMY  05/13/2022   Procedure: POLYPECTOMY;  Surgeon: Lemar Lofty., MD;  Location: [facility] ENDOSCOPY;  Service: Gastroenterology;;  EGD and colon   POLYPECTOMY  10/20/2023   Procedure: POLYPECTOMY;  Surgeon: Regis Bill, MD;  Location: [facility] ENDOSCOPY;  Service: Endoscopy;;   SUBMUCOSAL LIFTING INJECTION  03/19/2021   Procedure: SUBMUCOSAL LIFTING INJECTION;  Surgeon: Lemar Lofty., MD;  Location: [facility] ENDOSCOPY;  Service: Gastroenterology;;   SUBMUCOSAL LIFTING INJECTION  05/13/2022   Procedure: SUBMUCOSAL LIFTING INJECTION;  Surgeon: Lemar Lofty., MD;  Location: [facility] ENDOSCOPY;  Service:  Gastroenterology;;   SUBMUCOSAL TATTOO INJECTION  05/13/2022   Procedure: SUBMUCOSAL TATTOO INJECTION;  Surgeon: Lemar Lofty., MD;  Location: [facility] ENDOSCOPY;  Service: Gastroenterology;;   TONSILLECTOMY     UPPER ESOPHAGEAL  ENDOSCOPIC ULTRASOUND (EUS) N/A 03/19/2021   Procedure: UPPER ESOPHAGEAL ENDOSCOPIC ULTRASOUND (EUS);  Surgeon: Lemar Lofty., MD;  Location: [facility] ENDOSCOPY;  Service: Gastroenterology;  Laterality: N/A;     Current Outpatient Medications on File Prior to Visit  Medication Sig Dispense Refill   acetaminophen (TYLENOL) 650 MG CR tablet Take 1,300 mg by mouth See admin instructions. Take 1300 mg in the morning, may take a second 1300 mg dose during the day as needed for pain     albuterol (VENTOLIN HFA) 108 (90 Base) MCG/ACT inhaler Inhale 2 puffs into the lungs every 6 (six) hours as needed for wheezing or shortness of breath.     amLODipine (NORVASC) 5 MG tablet Take 1 tablet (5 mg total) by mouth daily. 30 tablet 0   apixaban (ELIQUIS) 5 MG TABS tablet Take 1 tablet (5 mg total) by mouth 2 (two) times daily.     enalapril (VASOTEC) 10 MG tablet Take 1 tablet (10 mg total) by mouth daily. 30 tablet 0   finasteride (PROSCAR) 5 MG tablet Take 1 tablet (5 mg total) by mouth daily. 30 tablet 11   metFORMIN (GLUCOPHAGE) 1000 MG tablet Take 1,000 mg by mouth daily with breakfast.     metoprolol succinate (TOPROL XL) 25 MG 24 hr tablet Take 1 tablet (25 mg total) by mouth every evening. 90 tablet 3   omeprazole (PRILOSEC) 20 MG capsule Take 20 mg by mouth 2 (two) times daily.     ondansetron (ZOFRAN) 8 MG tablet Take 8 mg by mouth 3 (three) times daily.     tamsulosin (FLOMAX) 0.4 MG CAPS capsule Take 1 capsule (0.4 mg total) by mouth daily. 90 capsule 3   tiZANidine (ZANAFLEX) 2 MG tablet Take 2 mg by mouth 3 (three) times daily.     colchicine 0.6 MG tablet Take 0.6 mg by mouth 2 (two) times daily as needed (gout). (Patient not taking: Reported on 01/02/2024)      meclizine (ANTIVERT) 25 MG tablet Take 25 mg by mouth every 6 (six) hours as needed. (Patient not taking: Reported on 01/02/2024)     No current facility-administered medications on file prior to visit.   No Known Allergies  Current Medications, Allergies, Past Medical History, Past Surgical History, Family History and Social History were reviewed in [employer] record.  Review of Systems:   Constitutional: See HPI. Respiratory: Negative for shortness of breath.   Cardiovascular: Negative for chest pain, palpitations and leg swelling.  Gastrointestinal: See HPI.  Musculoskeletal: Negative for back pain or muscle aches.  Neurological: Negative for dizziness, headaches or paresthesias.   Physical Exam: BP 128/78 (BP Location: Left Arm, Patient Position: Sitting, Cuff Size: Normal)   Pulse 91   Ht 5\' 10"  (1.778 m)   Wt 164 lb 3.2 oz (74.5 kg)   BMI 23.56 kg/m   Wt Readings from Last 3 Encounters:  01/02/24 164 lb 3.2 oz (74.5 kg)  11/15/23 154 lb (69.9 kg)  10/21/23 154 lb 5.2 oz (70 kg)    General: 63 year old male in no acute distress. Head: Normocephalic and atraumatic. Eyes: No scleral icterus. Conjunctiva pink . Ears: Normal auditory acuity. Mouth: Upper and lower dentures.  No ulcers or lesions.  Lungs: Clear throughout to auscultation. Heart: Regular rate and rhythm, no murmur. Abdomen: Soft, nontender and nondistended. No masses or hepatomegaly. Normal bowel sounds x 4 quadrants.  Rectal: Deferred.  Musculoskeletal: Symmetrical with no gross deformities. Extremities: No edema. Neurological: Alert oriented x 4. No  focal deficits.  Psychological: Alert and cooperative. Normal mood and affect  Assessment and Recommendations:  63 year old male with recurrent vomiting without nausea since 09/2023. Required hospital admission 12/16  - 10/2023.  Normal LFTs and lipase. CTAP and EGD findings did not explain symptoms. -CBC, CMP and lipase level -Gastric  emptying study to rule out gastroparesis -Recommended eating 3-4 small snack size meals daily  Chronic loose stools since cholecystectomy.  Metformin may be a contributing factor as well.  No evidence of colitis per recent colonoscopy 10/2023. CTAP with contrast 10/17/2023 showed the colon was decompressed with wall thickness in the cecum and ascending colon were ill-defined raising the question of mural edema concerning for possible right-sided colitis. Colonoscopy 12/19 identifying 10 tubular adenomatous polyps removed from the colon without evidence of colitis. Small bowel enterography 12/20 showed a normal small bowel. -Consider changing Metformin to extended release, defer to PCP -TTG, IgA -Consider rechecking chromogranin a level (patient denies having abdominal pain/sweats or flushing)  History of colon polyps. His most recent colonoscopy 10/20/2023 identified 10 tubular adenomatous polyps removed from the colon with recommendations to repeat a colonoscopy in 6 months due to a suboptimal prep. -Follow up in office May 2025, will schedule a colonoscopy June 2025 at time of follow up appointment   Dysphagia with liquids only -Schedule barium swallow study with tablet after gastric emptying study completed  Microcytic anemia, hemoglobin level 10.8.  Prior MCV 78 - 79). No overt GI bleeding.  EGD/colonoscopy 10/2023 findings did not explain etiology for anemia. -CBC, IBC + ferritin, B12 and folate level  History of neuroendocrine carcinoma of stomach   DM type II  Afib on Eliquis   CHF with LVEF 60 to 65%

## 2024-01-02 NOTE — Patient Instructions (Signed)
 You have been scheduled for a gastric emptying scan at [facility] on 01/13/24 at 10:00 am. Please arrive at least 15-20 minutes prior to your appointment for registration. Please make certain not to have anything to eat or drink after midnight the night before your test. Hold all stomach medications (ex: Zofran, phenergan, Reglan) 24 hours prior to your test. If you need to reschedule your appointment, please contact radiology scheduling at [phone]. ________________________________________________________________________________________  Your provider has requested that you go to the basement level for lab work before leaving today. Press "B" on the elevator. The lab is located at the first door on the left as you exit the elevator.  Recommend 3-4 small sized meals daily.  Contact our office if vomiting worsens.  Due to recent changes in healthcare laws, you may see the results of your imaging and laboratory studies on MyChart before your provider has had a chance to review them.  We understand that in some cases there may be results that are confusing or concerning to you. Not all laboratory results come back in the same time frame and the provider may be waiting for multiple results in order to interpret others.  Please give [place] 48 hours in order for your provider to thoroughly review all the results before contacting the office for clarification of your results.   Thank you for trusting me with your gastrointestinal care!   Alcide Evener, CRNP

## 2024-01-02 NOTE — Progress Notes (Signed)
 Attending Physician's Attestation   I have reviewed the chart.   I agree with the Advanced Practitioner's note, impression, and recommendations with any updates as below.    Corliss Parish, MD [facility] Endoscopy Office # [phone]

## 2024-01-04 LAB — IGA: Immunoglobulin A: 165 mg/dL (ref 70–320)

## 2024-01-04 LAB — TISSUE TRANSGLUTAMINASE ABS,IGG,IGA
(tTG) Ab, IgA: <1 U/mL
(tTG) Ab, IgG: <1 U/mL

## 2024-01-13 ENCOUNTER — Encounter
Admission: RE | Admit: 2024-01-13 | Discharge: 2024-01-13 | Disposition: A | Discharge status: Home / Self Care | Source: Ambulatory Visit | Attending: Nurse Practitioner | Admitting: Nurse Practitioner

## 2024-01-13 DIAGNOSIS — R1111 Vomiting without nausea: Secondary | ICD-10-CM | POA: Insufficient documentation

## 2024-01-13 DIAGNOSIS — K529 Noninfective gastroenteritis and colitis, unspecified: Secondary | ICD-10-CM | POA: Insufficient documentation

## 2024-01-13 DIAGNOSIS — D649 Anemia, unspecified: Secondary | ICD-10-CM | POA: Diagnosis present

## 2024-01-13 MED ORDER — TECHNETIUM TC 99M SULFUR COLLOID
2.2300 | Freq: Once | INTRAVENOUS | Status: AC | PRN
Start: 2024-01-13 — End: 2024-01-13
  Administered 2024-01-13: 2.23 via ORAL

## 2024-01-15 ENCOUNTER — Other Ambulatory Visit: Payer: Self-pay | Admitting: Cardiology

## 2024-01-24 ENCOUNTER — Other Ambulatory Visit: Payer: Self-pay

## 2024-01-24 DIAGNOSIS — R1111 Vomiting without nausea: Secondary | ICD-10-CM

## 2024-01-26 ENCOUNTER — Telehealth: Payer: Self-pay | Admitting: *Deleted

## 2024-01-26 NOTE — Telephone Encounter (Signed)
   Pre-operative Risk Assessment    Patient Name: Kevin Esparza  DOB: May 04, 1961 MRN: [id number]   Date of last office visit: 07/29/23 Date of next office visit: 01/27/24  APPT NOTES ALREADY UPDATED   Request for Surgical Clearance    Procedure:   COLONOSCOPY/EGD  Date of Surgery:  Clearance 04/03/24                                Surgeon:   Surgeon's Group or Practice Name:  [facility] Phone number:  [phone] Fax number:  [phone]   Type of Clearance Requested:   - Medical  - Pharmacy:  Hold Apixaban (Eliquis) X'S 2 - 4 DAYS   Type of Anesthesia:  Not Indicated   Additional requests/questions:    [name]   01/26/2024, 12:27 PM

## 2024-01-26 NOTE — Progress Notes (Unsigned)
 Electrophysiology Clinic Note    Date:  01/27/2024  Patient ID:  Kevin, Esparza May 16, 1961, MRN [id number] PCP:  Shane Crutch, PA  Cardiologist:  Debbe Odea, MD Electrophysiologist: Lanier Prude, MD    Discussed the use of AI scribe software for clinical note transcription with the patient, who gave verbal consent to proceed.   Patient Profile    Chief Complaint: afib, pre-procedure cardiac clearance  History of Present Illness: Kevin Esparza is a 63 y.o. male with PMH notable for parox AFib, PACs, atrial tach, HTN, T2DM, tobacco use ; seen today for Lanier Prude, MD for routine electrophysiology follow-up and pre-procedural cardiac evaluation for colonoscopy, EGD.   He last saw Dr. Lalla Esparza 12/2022 for initial EP evaluation of palpitations. Recent ambulatory monitor with symptom activation of sinus rhythm, and he was started on 25mg  toprol nightly. He saw Dr. Azucena Esparza 07/2023 and denied palpitations. Sleep study was recommended but declined.  He was hospitalized 10/2023 for vomiting, potassium at that time was 1.8. He has followed up with GI since that time, planning for colonoscopy, EGD in about 2 months to further eval.   On follow-up today, he has ongoing fatigue, SOB, palpitations. States that these symptoms have slowly gotten worse over the past few months, but that he does not remember feeling as fatigued as he currently 6 months ago. He has recently started b12 injections.  He does continue to have palpitations that are "quick on, quick off". He denies sustained palpitation episodes. He denies chest pain, chest pressure, lower extremity edema. He continues to take potassium supplement without issue.   He has quit smoking cigarettes, but uses chewing tobacco all day everyday. He also intermittently smokes marijuana for pain relief.   No bleeding concerns on eliquis.     Arrhythmia/Device History No specialty comments  available.    ROS:  Please see the history of present illness. All other systems are reviewed and otherwise negative.    Physical Exam    VS:  BP (!) 157/84 (BP Location: Left Arm, Patient Position: Sitting, Cuff Size: Normal)   Pulse 70   Ht 5\' 10"  (1.778 m)   Wt 162 lb (73.5 kg)   SpO2 98%   BMI 23.24 kg/m  BMI: Body mass index is 23.24 kg/m.  Wt Readings from Last 3 Encounters:  01/27/24 162 lb (73.5 kg)  01/02/24 164 lb 3.2 oz (74.5 kg)  11/15/23 154 lb (69.9 kg)     GEN- The patient is well appearing, alert and oriented x 3 today.   Lungs- Expiratory wheezes in bases, normal work of breathing.  Heart- Regular rate and rhythm, no murmurs, rubs or gallops Extremities- No peripheral edema, warm, dry    Studies Reviewed   Previous EP, cardiology notes.    EKG is ordered. Personal review of EKG from today shows:    EKG Interpretation Date/Time:  Friday January 27 2024 09:26:32 EDT Ventricular Rate:  70 PR Interval:  134 QRS Duration:  88 QT Interval:  406 QTC Calculation: 438 R Axis:   13  Text Interpretation: Normal sinus rhythm Normal ECG Confirmed by Sherie Don [phone]) on 01/27/2024 9:29:15 AM    Long term monitor, 01/17/2023 Patient had a min HR of 45 bpm, max HR of 174 bpm, and avg HR of 65 bpm. Predominant underlying rhythm was Sinus Rhythm. 2 Ventricular Tachycardia runs occurred, the run with the fastest interval lasting 5 beats with a max rate of 174 bpm, the longest  lasting 5 beats with an avg rate of 153 bpm. 31 Supraventricular Tachycardia runs occurred, the run with the fastest interval lasting 4 beats with a max rate of 160 bpm, the longest lasting 16 beats with an avg rate of 111 bpm. Ventricular Tachycardia  and Supraventricular Tachycardia were detected within +/- 45 seconds of symptomatic patient event(s). Isolated SVEs were rare (<1.0%), SVE Couplets were rare (<1.0%), and SVE Triplets were rare (<1.0%). Isolated VEs were rare (<1.0%, 151), VE  Couplets were rare (<1.0%, 16), and VE Triplets were rare (<1.0%, 2).    Conclusion Cardiac monitor with no evidence of A-fib.   Paroxysmal SVT and nonsustained VT lasting 5 beats noted.  TTE, 07/27/2022  1. Left ventricular ejection fraction, by estimation, is 60 to 65%. The left ventricle has normal function. The left ventricle has no regional wall motion abnormalities. Left ventricular diastolic parameters were normal.   2. Right ventricular systolic function is normal. The right ventricular size is normal. Tricuspid regurgitation signal is inadequate for assessing PA pressure.   3. The mitral valve is normal in structure. Mild mitral valve regurgitation. No evidence of mitral stenosis.   4. Tricuspid valve regurgitation is mild to moderate.   5. The aortic valve is tricuspid. There is mild thickening of the aortic valve. Aortic valve regurgitation is mild. Aortic valve sclerosis is present, with no evidence of aortic valve stenosis.   6. The inferior vena cava is normal in size with greater than 50% respiratory variability, suggesting right atrial pressure of 3 mmHg.    Assessment and Plan     #) parox Afib #) palpitations #) atrial tach No significant recent episodes. His current quick on, quick off palpitation episodes sound like PAC vs PVCs Continue 25mg  toprol daily  #) Hypercoag d/t parox afib CHA2DS2-VASc Score = at least 6 [CHF History: 1, HTN History: 1, Diabetes History: 1, Stroke History: 2, Vascular Disease History: 1, Age Score: 0, Gender Score: 0].  Therefore, the patient's annual risk of stroke is 9.7 %.    Stroke ppx - 5mg  eliquis BID, appropriately dosed No bleeding concerns  #) fatigue Significant worsening of activity tolerance in the past 6 months Patient recently started on B12 injections Update TTE to evaluate a cardiac cause  #) wheezing #) former smoker #) daytime fatigue Recommended Pulm evaluation to further eval, patient is agreeable to consult   #)  pre-procedural cardiac evaluation Planning for colonoscopy/EGD RCRI score of 2 indicating at 6.6% risk of major perioperative cardiac event Given his recent worsening of fatigue and SOB, recommended updated TTE as above Follow-up with general cardiology after TTE to re-assess cardiology recommendations prior to colonoscopy         Current medicines are reviewed at length with the patient today.   The patient does not have concerns regarding his medicines.  The following changes were made today:  none  Labs/ tests ordered today include:  Orders Placed This Encounter  Procedures   Ambulatory referral to Pulmonology   EKG 12-Lead   ECHOCARDIOGRAM COMPLETE     Disposition:  TTE at next available, follow-up with general cardiology after TTE to discuss fatigue, SOB   Follow up with Dr. Lalla Esparza or EP APP in 12 months   Signed, Sherie Don, NP  01/27/24  12:29 PM  Electrophysiology [facility]

## 2024-01-27 ENCOUNTER — Ambulatory Visit: Attending: Cardiology | Admitting: Cardiology

## 2024-01-27 VITALS — BP 157/84 | HR 70 | Ht 70.0 in | Wt 162.0 lb

## 2024-01-27 DIAGNOSIS — Z01810 Encounter for preprocedural cardiovascular examination: Secondary | ICD-10-CM

## 2024-01-27 DIAGNOSIS — I480 Paroxysmal atrial fibrillation: Secondary | ICD-10-CM

## 2024-01-27 DIAGNOSIS — D6869 Other thrombophilia: Secondary | ICD-10-CM | POA: Diagnosis not present

## 2024-01-27 DIAGNOSIS — R5383 Other fatigue: Secondary | ICD-10-CM

## 2024-01-27 NOTE — Patient Instructions (Signed)
 Medication Instructions:  The current medical regimen is effective;  continue present plan and medications as directed. Please refer to the Current Medication list given to you today.   *If you need a refill on your cardiac medications before your next appointment, please call your pharmacy*   Testing/Procedures: Your physician has requested that you have an echocardiogram. Echocardiography is a painless test that uses sound waves to create images of your heart. It provides your doctor with information about the size and shape of your heart and how well your heart's chambers and valves are working.   You may receive an ultrasound enhancing agent through an IV if needed to better visualize your heart during the echo. This procedure takes approximately one hour.  There are no restrictions for this procedure.  This will take place at [address] [facility] [address], [place] [address]  Please note: We ask at that you not bring children with you during ultrasound (echo/ vascular) testing. Due to room size and safety concerns, children are not allowed in the ultrasound rooms during exams. Our front office staff cannot provide observation of children in our lobby area while testing is being conducted. An adult accompanying a patient to their appointment will only be allowed in the ultrasound room at the discretion of the ultrasound technician under special circumstances. We apologize for any inconvenience.   Follow-Up: At [facility], you and your health needs are our priority.  As part of our continuing mission to provide you with exceptional heart care, our providers are all part of one team.  This team includes your primary Cardiologist (physician) and Advanced Practice Providers or APPs (Physician Assistants and Nurse Practitioners) who all work together to provide you with the care you need, when you need it.  Your next appointment:   After ECHO  Provider:   You may  see Debbe Odea, MD or one of the following Advanced Practice Providers on your designated Care Team:   Nicolasa Ducking, NP Ames Dura, PA-C Eula Listen, PA-C Cadence Riverton, PA-C Charlsie Quest, NP Carlos Levering, NP    We recommend signing up for the patient portal called "MyChart".  Sign up information is provided on this After Visit Summary.  MyChart is used to connect with patients for Virtual Visits (Telemedicine).  Patients are able to view lab/test results, encounter notes, upcoming appointments, etc.  Non-urgent messages can be sent to your provider as well.   To learn more about what you can do with MyChart, go to [email or web address].   Other Instructions Referral to Pulmonology- they will call you for an appointment.

## 2024-01-31 ENCOUNTER — Encounter: Payer: Self-pay | Admitting: Sleep Medicine

## 2024-01-31 ENCOUNTER — Ambulatory Visit: Admitting: Sleep Medicine

## 2024-01-31 VITALS — BP 124/78 | HR 82 | Temp 97.1°F | Ht 70.0 in | Wt 161.0 lb

## 2024-01-31 DIAGNOSIS — G4733 Obstructive sleep apnea (adult) (pediatric): Secondary | ICD-10-CM | POA: Diagnosis not present

## 2024-01-31 DIAGNOSIS — I4891 Unspecified atrial fibrillation: Secondary | ICD-10-CM | POA: Diagnosis not present

## 2024-01-31 DIAGNOSIS — I10 Essential (primary) hypertension: Secondary | ICD-10-CM | POA: Diagnosis not present

## 2024-01-31 NOTE — Telephone Encounter (Signed)
 Patient with diagnosis of Afib on Eliquis for anticoagulation.    Procedure: COLONOSCOPY/EGD Date of procedure: 04/03/24   CHA2DS2-VASc Score = 6   This indicates a 9.7% annual risk of stroke. The patient's score is based upon: CHF History: 1 HTN History: 1 Diabetes History: 1 Stroke History: 2 Vascular Disease History: 1 Age Score: 0 Gender Score: 0     CrCl 91 mL/min Platelet count 225 K    Per office protocol, patient can hold Eliquis for 2 days prior to procedure.   Patient will not need bridging with Lovenox (enoxaparin) around procedure.  **This guidance is not considered finalized until pre-operative APP has relayed final recommendations.**

## 2024-01-31 NOTE — Patient Instructions (Signed)
 Kevin Esparza

## 2024-01-31 NOTE — Progress Notes (Signed)
 Name:Kevin Esparza MRN: [id number] DOB: [birth year]   CHIEF COMPLAINT:  EXCESSIVE DAYTIME SLEEPINESS   HISTORY OF PRESENT ILLNESS:  Mr. Brickey is a 63 y.o. w/ a h/o atrial fibrillation, CVA, HTN, DMII and CHF who presents for c/o snoring, restless sleep and fatigue which has been present for several months. Reports nocturnal awakenings due to nocturia, however does not have difficulty falling back to sleep. Denies any significant weight changes. Denies morning headaches, RLS symptoms, dream enactment, cataplexy, hypnagogic or hypnapompic hallucinations. Denies a family history of sleep apnea. Denies drowsy driving. Denies caffeine intake. Denies alcohol use, chews tobacco all day, smokes marijuana occasionally.   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  has a past surgical history that includes Cervical spine surgery (1997); Tonsillectomy; Cholecystectomy (2005); Appendectomy (2005); Esophagogastroduodenoscopy (egd) with propofol (N/A, 12/31/2020); Colonoscopy with propofol (N/A, 12/31/2020); Multiple tooth extractions; Colonoscopy with propofol (N/A, 03/19/2021); Upper esophageal endoscopic ultrasound (eus) (N/A, 03/19/2021); biopsy (03/19/2021); Endoscopic mucosal resection (03/19/2021); polypectomy (03/19/2021); Esophagogastroduodenoscopy (egd) with propofol (N/A, 03/19/2021); Hemostasis clip placement (03/19/2021); Submucosal lifting injection (03/19/2021);  Colonoscopy with propofol (N/A, 05/13/2022); Endoscopic mucosal resection (N/A, 05/13/2022); Esophagogastroduodenoscopy (egd) with propofol (N/A, 05/13/2022); Submucosal lifting injection (05/13/2022); Submucosal tattoo injection (05/13/2022); polypectomy (05/13/2022); biopsy (05/13/2022); Hemostasis clip placement (05/13/2022); Esophagogastroduodenoscopy (egd) with propofol (N/A, 10/20/2023); Colonoscopy with propofol (N/A, 10/20/2023); biopsy (10/20/2023); and polypectomy (10/20/2023). Prior to Admission medications   Medication Sig Start Date End Date Taking? Authorizing Provider  acetaminophen (TYLENOL) 650 MG CR tablet Take 1,300 mg by mouth See admin instructions. Take 1300 mg in the morning, may take a second 1300 mg dose during the day as needed for pain   Yes [provider]  albuterol (VENTOLIN HFA) 108 (90 Base) MCG/ACT inhaler Inhale 2 puffs into the lungs every 6 (six) hours as needed for wheezing or shortness of breath.   Yes [provider]  amLODipine (NORVASC) 5 MG tablet Take 1 tablet (5 mg total) by mouth daily. 07/29/22  Yes Sunnie Nielsen, DO  apixaban (ELIQUIS) 5 MG TABS tablet Take 1 tablet (5 mg total) by mouth 2 (two) times daily. 10/22/23  Yes Delfino Lovett, MD  azelastine (ASTELIN) 0.1 % nasal spray Place 1 spray into both nostrils as needed. 01/23/24  Yes [provider]  colchicine 0.6 MG tablet Take 0.6 mg by mouth 2 (two) times daily as needed (gout).   Yes [provider]  cyanocobalamin (VITAMIN B12) 1000 MCG/ML injection  01/16/24  Yes [provider]  enalapril (VASOTEC) 10 MG tablet Take 1 tablet (10 mg total) by mouth daily. 07/29/22  Yes Sunnie Nielsen, DO  finasteride (PROSCAR) 5 MG tablet Take 1 tablet (5 mg total) by mouth daily. 11/15/23  Yes Vanna Scotland, MD  KLOR-CON M20 20 MEQ tablet Take by mouth. 01/24/24  Yes [provider]  meclizine (ANTIVERT) 25 MG tablet Take 25 mg by mouth every 6 (six) hours as  needed. 02/07/23  Yes [provider]  metFORMIN (GLUCOPHAGE)  1000 MG tablet Take 1,000 mg by mouth daily with breakfast.   Yes [provider]  metoprolol succinate (TOPROL-XL) 25 MG 24 hr tablet Take 1 tablet by mouth in the evening 01/17/24  Yes Lanier Prude, MD  omeprazole (PRILOSEC) 20 MG capsule Take 20 mg by mouth 2 (two) times daily. 04/21/23  Yes [provider]  ondansetron (ZOFRAN) 8 MG tablet Take 8 mg by mouth 3 (three) times daily. 01/26/23  Yes [provider]  tamsulosin (FLOMAX) 0.4 MG CAPS capsule Take 1 capsule (0.4 mg total) by mouth daily. 03/16/22  Yes McGowan, Carollee Herter A, PA-C  tiZANidine (ZANAFLEX) 2 MG tablet Take 2 mg by mouth 3 (three) times daily. 09/26/23  Yes [provider]   No Known Allergies  FAMILY HISTORY:  family history includes Cancer in his father; Diabetes in his mother; Hypertension in his mother; Kidney cancer in his maternal grandmother; Liver cancer in his maternal grandmother; Lung cancer in his maternal grandfather; Osteoarthritis in his mother; Osteoporosis in his mother; Stomach cancer in his maternal grandmother; Stroke in his father. SOCIAL HISTORY:  reports that he quit smoking about 19 years ago. His smoking use included cigarettes. He started smoking about 33 years ago. His smokeless tobacco use includes chew. He reports that he does not currently use alcohol after a past usage of about 1.0 standard drink of alcohol per week. He reports that he does not currently use drugs after having used the following drugs: Marijuana.   Review of Systems:  Gen:  Denies  fever, sweats, chills weight loss  HEENT: Denies blurred vision, double vision, ear pain, eye pain, hearing loss, nose bleeds, sore throat Cardiac:  No dizziness, chest pain or heaviness, chest tightness,edema, No JVD Resp:   No cough, -sputum production, -shortness of breath,-wheezing, -hemoptysis,  Gi: Denies swallowing difficulty, stomach  pain, nausea or vomiting, diarrhea, constipation, bowel incontinence Gu:  Denies bladder incontinence, burning urine Ext:   Denies Joint pain, stiffness or swelling Skin: Denies  skin rash, easy bruising or bleeding or hives Endoc:  Denies polyuria, polydipsia , polyphagia or weight change Psych:   Denies depression, insomnia or hallucinations  Other:  All other systems negative  VITAL SIGNS: BP 124/78 (BP Location: Right Arm, Cuff Size: Normal)   Pulse 82   Temp (!) 97.1 F (36.2 C)   Ht 5\' 10"  (1.778 m)   Wt 161 lb (73 kg)   SpO2 98%   BMI 23.10 kg/m     Physical Examination:   General Appearance: No distress  EYES PERRLA, EOM intact.   NECK Supple, No JVD Pulmonary: normal breath sounds, No wheezing.  CardiovascularNormal S1,S2.  No m/r/g.   Abdomen: Benign, Soft, non-tender. Skin:   warm, no rashes, no ecchymosis  Extremities: normal, no cyanosis, clubbing. Neuro:without focal findings,  speech normal  PSYCHIATRIC: Mood, affect within normal limits.   ASSESSMENT AND PLAN  OSA I suspect that OSA is likely present due to clinical presentation. Discussed the consequences of untreated sleep apnea. Advised not to drive drowsy for safety of patient and others. Will complete further evaluation with a home sleep study and follow up to review results.    HTN Stable, on current management. Following with PCP.   Atrial fibrillation Stable, on current management.    MEDICATION ADJUSTMENTS/LABS AND TESTS ORDERED: Recommend Sleep Study   Patient  satisfied with Plan of action and management. All questions answered  Follow up to review HST results and treatment plan.   I  spent a total of 48 minutes reviewing chart data, face-to-face evaluation with the patient, counseling and coordination of care as detailed above.    Tempie Hoist, M.D.  Sleep Medicine [facility]

## 2024-02-06 NOTE — Telephone Encounter (Signed)
   Name: Kevin Esparza  DOB: [birth year]  MRN: [id number]  Primary Cardiologist: Debbe Odea, MD  Chart reviewed as part of pre-operative protocol coverage. Because of Kevin Esparza's past medical history and time since last visit, he will require a follow-up in-office visit in order to better assess preoperative cardiovascular risk.  Patient has an office visit scheduled on 02/24/2024 with Dr. Azucena Cecil. Appointment notes have been updated to reflect need for pre-op evaluation.   This message will also be routed to pharmacy pool for input on holding Eliquis as requested below so that this information is available to the clearing provider at time of patient's appointment.   Carlos Levering, NP  02/06/2024, 11:06 AM

## 2024-02-14 ENCOUNTER — Encounter

## 2024-02-14 DIAGNOSIS — G4733 Obstructive sleep apnea (adult) (pediatric): Secondary | ICD-10-CM

## 2024-02-21 ENCOUNTER — Ambulatory Visit: Attending: Cardiology

## 2024-02-21 DIAGNOSIS — I480 Paroxysmal atrial fibrillation: Secondary | ICD-10-CM | POA: Diagnosis not present

## 2024-02-21 DIAGNOSIS — I083 Combined rheumatic disorders of mitral, aortic and tricuspid valves: Secondary | ICD-10-CM

## 2024-02-21 LAB — ECHOCARDIOGRAM COMPLETE
AR max vel: 1.91 cm2
AV Area VTI: 1.73 cm2
AV Area mean vel: 1.84 cm2
AV Mean grad: 4 mmHg
AV Peak grad: 8.4 mmHg
Ao pk vel: 1.45 m/s
Area-P 1/2: 3.17 cm2
S' Lateral: 3.37 cm

## 2024-02-24 ENCOUNTER — Ambulatory Visit: Attending: Cardiology | Admitting: Cardiology

## 2024-02-24 ENCOUNTER — Encounter: Payer: Self-pay | Admitting: Cardiology

## 2024-02-24 VITALS — BP 110/67 | HR 67 | Ht 70.0 in | Wt 159.0 lb

## 2024-02-24 DIAGNOSIS — Z01810 Encounter for preprocedural cardiovascular examination: Secondary | ICD-10-CM

## 2024-02-24 DIAGNOSIS — R0609 Other forms of dyspnea: Secondary | ICD-10-CM

## 2024-02-24 DIAGNOSIS — E782 Mixed hyperlipidemia: Secondary | ICD-10-CM

## 2024-02-24 DIAGNOSIS — I480 Paroxysmal atrial fibrillation: Secondary | ICD-10-CM | POA: Diagnosis not present

## 2024-02-24 DIAGNOSIS — I10 Essential (primary) hypertension: Secondary | ICD-10-CM | POA: Diagnosis not present

## 2024-02-24 NOTE — Progress Notes (Signed)
 Cardiology Office Note:    Date:  02/24/2024   ID:  Kevin Esparza, DOB [birth year], MRN [id number]  PCP:  Aneita Baptise, PA    [facility]Care Providers Cardiologist:  Constancia Delton, MD Electrophysiologist:  Boyce Byes, MD     Referring MD: Aneita Baptise, [place]   No chief complaint on file.   History of Present Illness:    Kevin Esparza is a 63 y.o. male with a hx of paroxysmal atrial fibrillation, hypertension, diabetes, GERD, former smoker x 25+ years presenting for preop evaluation.  Colonoscopy is being planned in about 2 months.  He denies chest pain.  Endorses having shortness of breath with minimal exertion.  Denies edema.  Compliant with medications as prescribed, denies any bleeding issues with Eliquis .  Denies palpitations.  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   Pneumonia    several times   Stomach cancer (HCC)    Stroke [facility])     Past Surgical History:  Procedure Laterality Date   APPENDECTOMY  2005   BIOPSY  03/19/2021   Procedure: BIOPSY;  Surgeon: Normie Becton., MD;  Location: [facility] ENDOSCOPY;  Service: Gastroenterology;;   BIOPSY  05/13/2022   Procedure: BIOPSY;  Surgeon: Normie Becton., MD;  Location: [facility] ENDOSCOPY;  Service: Gastroenterology;;   BIOPSY  10/20/2023   Procedure: BIOPSY;  Surgeon: Shane Darling, MD;   Location: [facility] ENDOSCOPY;  Service: Endoscopy;;   CERVICAL SPINE SURGERY  1997   c4-c7   CHOLECYSTECTOMY  2005   COLONOSCOPY WITH PROPOFOL  N/A 12/31/2020   Procedure: COLONOSCOPY WITH PROPOFOL ;  Surgeon: Irby Mannan, MD;  Location: [facility] ENDOSCOPY;  Service: Endoscopy;  Laterality: N/A;   COLONOSCOPY WITH PROPOFOL  N/A 03/19/2021   Procedure: COLONOSCOPY WITH PROPOFOL ;  Surgeon: Mansouraty, Albino Alu., MD;  Location: [facility] ENDOSCOPY;  Service: Gastroenterology;  Laterality: N/A;   COLONOSCOPY WITH PROPOFOL  N/A 05/13/2022   Procedure: COLONOSCOPY WITH PROPOFOL ;  Surgeon: Brice Campi Albino Alu., MD;  Location: [facility] ENDOSCOPY;  Service: Gastroenterology;  Laterality: N/A;   COLONOSCOPY WITH PROPOFOL  N/A 10/20/2023   Procedure: COLONOSCOPY WITH PROPOFOL ;  Surgeon: Shane Darling, MD;  Location: [facility] ENDOSCOPY;  Service: Endoscopy;  Laterality: N/A;   ENDOSCOPIC MUCOSAL RESECTION  03/19/2021   Procedure: ENDOSCOPIC MUCOSAL RESECTION;  Surgeon: Brice Campi Albino Alu., MD;  Location: [facility] ENDOSCOPY;  Service: Gastroenterology;;   ENDOSCOPIC MUCOSAL RESECTION N/A 05/13/2022   Procedure: ENDOSCOPIC MUCOSAL RESECTION;  Surgeon: Normie Becton., MD;  Location: [facility] ENDOSCOPY;  Service: Gastroenterology;  Laterality: N/A;   ESOPHAGOGASTRODUODENOSCOPY (EGD) WITH PROPOFOL  N/A 12/31/2020   Procedure: ESOPHAGOGASTRODUODENOSCOPY (EGD) WITH PROPOFOL ;  Surgeon: Irby Mannan, MD;  Location: [facility] ENDOSCOPY;  Service: Endoscopy;  Laterality: N/A;   ESOPHAGOGASTRODUODENOSCOPY (EGD) WITH PROPOFOL  N/A 03/19/2021   Procedure: ESOPHAGOGASTRODUODENOSCOPY (EGD) WITH PROPOFOL ;  Surgeon: Normie Becton.,  MD;  Location: [facility] ENDOSCOPY;  Service: Gastroenterology;  Laterality: N/A;   ESOPHAGOGASTRODUODENOSCOPY (EGD) WITH PROPOFOL  N/A 05/13/2022   Procedure: ESOPHAGOGASTRODUODENOSCOPY (EGD) WITH PROPOFOL ;  Surgeon: Brice Campi Albino Alu., MD;  Location: [facility] ENDOSCOPY;  Service: Gastroenterology;  Laterality:  N/A;   ESOPHAGOGASTRODUODENOSCOPY (EGD) WITH PROPOFOL  N/A 10/20/2023   Procedure: ESOPHAGOGASTRODUODENOSCOPY (EGD) WITH PROPOFOL ;  Surgeon: Shane Darling, MD;  Location: [facility] ENDOSCOPY;  Service: Endoscopy;  Laterality: N/A;   HEMOSTASIS CLIP PLACEMENT  03/19/2021   Procedure: HEMOSTASIS CLIP PLACEMENT;  Surgeon: Normie Becton., MD;  Location: [facility] ENDOSCOPY;  Service: Gastroenterology;;   HEMOSTASIS CLIP PLACEMENT  05/13/2022   Procedure: HEMOSTASIS CLIP PLACEMENT;  Surgeon: Normie Becton., MD;  Location: [facility] ENDOSCOPY;  Service: Gastroenterology;;   MULTIPLE TOOTH EXTRACTIONS     dentures   POLYPECTOMY  03/19/2021   Procedure: POLYPECTOMY;  Surgeon: Normie Becton., MD;  Location: [facility] ENDOSCOPY;  Service: Gastroenterology;;   POLYPECTOMY  05/13/2022   Procedure: POLYPECTOMY;  Surgeon: Normie Becton., MD;  Location: [facility] ENDOSCOPY;  Service: Gastroenterology;;  EGD and colon   POLYPECTOMY  10/20/2023   Procedure: POLYPECTOMY;  Surgeon: Shane Darling, MD;  Location: [facility] ENDOSCOPY;  Service: Endoscopy;;   SUBMUCOSAL LIFTING INJECTION  03/19/2021   Procedure: SUBMUCOSAL LIFTING INJECTION;  Surgeon: Normie Becton., MD;  Location: [facility] ENDOSCOPY;  Service: Gastroenterology;;   SUBMUCOSAL LIFTING INJECTION  05/13/2022   Procedure: SUBMUCOSAL LIFTING INJECTION;  Surgeon: Normie Becton., MD;  Location: [facility] ENDOSCOPY;  Service: Gastroenterology;;   SUBMUCOSAL TATTOO INJECTION  05/13/2022   Procedure: SUBMUCOSAL TATTOO INJECTION;  Surgeon: Normie Becton., MD;  Location: [facility] ENDOSCOPY;  Service: Gastroenterology;;   TONSILLECTOMY     UPPER ESOPHAGEAL ENDOSCOPIC ULTRASOUND (EUS) N/A 03/19/2021   Procedure: UPPER ESOPHAGEAL ENDOSCOPIC ULTRASOUND (EUS);  Surgeon: Normie Becton., MD;  Location: [facility] ENDOSCOPY;  Service: Gastroenterology;  Laterality: N/A;    Current Medications: Current Meds  Medication Sig   acetaminophen  (TYLENOL ) 650  MG CR tablet Take 1,300 mg by mouth See admin instructions. Take 1300 mg in the morning, may take a second 1300 mg dose during the day as needed for pain   albuterol  (VENTOLIN  HFA) 108 (90 Base) MCG/ACT inhaler Inhale 2 puffs into the lungs every 6 (six) hours as needed for wheezing or shortness of breath.   amLODipine  (NORVASC ) 5 MG tablet Take 1 tablet (5 mg total) by mouth daily.   apixaban  (ELIQUIS ) 5 MG TABS tablet Take 1 tablet (5 mg total) by mouth 2 (two) times daily.   azelastine (ASTELIN) 0.1 % nasal spray Place 1 spray into both nostrils as needed.   colchicine  0.6 MG tablet Take 0.6 mg by mouth 2 (two) times daily as needed (gout).   cyanocobalamin  (VITAMIN B12) 1000 MCG/ML injection    enalapril  (VASOTEC ) 10 MG tablet Take 1 tablet (10 mg total) by mouth daily.   finasteride  (PROSCAR ) 5 MG tablet Take 1 tablet (5 mg total) by mouth daily.   KLOR-CON  M20 20 MEQ tablet Take by mouth.   meclizine  (ANTIVERT ) 25 MG tablet Take 25 mg by mouth every 6 (six) hours as needed.   metFORMIN (GLUCOPHAGE) 1000 MG tablet Take 1,000 mg by mouth daily with breakfast.   metoprolol  succinate (TOPROL -XL) 25 MG 24 hr tablet Take 1 tablet by mouth in the evening   omeprazole  (PRILOSEC ) 20 MG capsule Take 20 mg by mouth 2 (two) times daily.   ondansetron  (ZOFRAN ) 8 MG tablet Take 8 mg by mouth 3 (three) times  daily.   tamsulosin  (FLOMAX ) 0.4 MG CAPS capsule Take 1 capsule (0.4 mg total) by mouth daily.   tiZANidine  (ZANAFLEX ) 2 MG tablet Take 2 mg by mouth 3 (three) times daily.     Allergies:   Patient has no known allergies.   Social History   Socioeconomic History   Marital status: Married    Spouse name: Not on file   Number of children: 3   Years of education: Not on file   Highest education level: Not on file  Occupational History   Occupation: disabled  Tobacco Use   Smoking status: Former    Current packs/day: 0.00    Types: Cigarettes    Start date: 03/05/1990    Quit date: 03/05/2004     Years since quitting: 19.9   Smokeless tobacco: Current    Types: Chew  Vaping Use   Vaping status: Never Used  Substance and Sexual Activity   Alcohol use: Not Currently    Alcohol/week: 1.0 standard drink of alcohol    Types: 1 Cans of beer per week   Drug use: Not Currently    Types: Marijuana    Comment: occ use   Sexual activity: Yes  Other Topics Concern   Not on file  Social History Narrative   Not on file   Social Drivers of Health   Financial Resource Strain: Not on file  Food Insecurity: No Food Insecurity (10/18/2023)   Hunger Vital Sign    Worried About Running Out of Food in the Last Year: Never true    Ran Out of Food in the Last Year: Never true  Transportation Needs: No Transportation Needs (10/18/2023)   PRAPARE - Administrator, Civil Service (Medical): No    Lack of Transportation (Non-Medical): No  Physical Activity: Not on file  Stress: Not on file  Social Connections: Not on file     Family History: The patient's family history includes Cancer in his father; Diabetes in his mother; Hypertension in his mother; Kidney cancer in his maternal grandmother; Liver cancer in his maternal grandmother; Lung cancer in his maternal grandfather; Osteoarthritis in his mother; Osteoporosis in his mother; Stomach cancer in his maternal grandmother; Stroke in his father. There is no history of Kidney disease, Prostate cancer, Bladder Cancer, Colon cancer, Esophageal cancer, Inflammatory bowel disease, Pancreatic cancer, Rectal cancer, or Colon polyps.  ROS:   Please see the history of present illness.     All other systems reviewed and are negative.  EKGs/Labs/Other Studies Reviewed:    The following studies were reviewed today:  EKG Interpretation Date/Time:  Friday February 24 2024 11:53:24 EDT Ventricular Rate:  67 PR Interval:  130 QRS Duration:  84 QT Interval:  368 QTC Calculation: 388 R Axis:   39  Text Interpretation: Normal sinus rhythm  Normal ECG Confirmed by Constancia Delton ([id number]) on 02/24/2024 11:59:38 AM    Recent Labs: 10/17/2023: B Natriuretic Peptide 23.0 10/21/2023: Magnesium  1.8 01/02/2024: ALT 13; BUN 5; Creatinine, Ser 0.86; Hemoglobin 14.8; Platelets 225.0; Potassium 3.1; Sodium 141  Recent Lipid Panel    Component Value Date/Time   CHOL 171 07/27/2022 0506   TRIG 221 (H) 07/27/2022 0506   HDL 26 (L) 07/27/2022 0506   CHOLHDL 6.6 07/27/2022 0506   VLDL 44 (H) 07/27/2022 0506   LDLCALC 101 (H) 07/27/2022 0506     Risk Assessment/Calculations:        Physical Exam:    VS:  BP 110/67 (BP Location: Left Arm,  Patient Position: Sitting, Cuff Size: Normal)   Pulse 67   Ht 5\' 10"  (1.778 m)   Wt 159 lb (72.1 kg)   SpO2 99%   BMI 22.81 kg/m     Wt Readings from Last 3 Encounters:  02/24/24 159 lb (72.1 kg)  01/31/24 161 lb (73 kg)  01/27/24 162 lb (73.5 kg)     GEN:  Well nourished, well developed in no acute distress HEENT: Normal NECK: No JVD; No carotid bruits CARDIAC: RRR, no murmurs, rubs, gallops RESPIRATORY: Diminished breath sounds, no wheezing. ABDOMEN: Soft, non-tender, non-distended MUSCULOSKELETAL:  No edema; No deformity  SKIN: Warm and dry NEUROLOGIC:  Alert and oriented x 3 PSYCHIATRIC:  Normal affect   ASSESSMENT:    1. Pre-procedural cardiovascular examination   2. Paroxysmal atrial fibrillation (HCC)   3. Primary hypertension   4. Mixed hyperlipidemia   5. Dyspnea on exertion    PLAN:    In order of problems listed above:  Preop evaluation, colonoscopy being planned.  Echo 02/21/2024 shows normal systolic and diastolic function EF 55 to 60%, prior Lexiscan  Myoview  with no ischemia.  Okay for procedure from a cardiac perspective.  Colonoscopy usually deemed low risk. Paroxysmal atrial fibrillation.  Denies palpitations.  EKG showing sinus rhythm.  Echo 9/23 EF 60 to 65% continue Toprol -XL 25 mg daily, Eliquis  5 mg twice daily.   Hypertension, BP controlled.  Continue  Toprol -XL 25 mg daily, Vasotec  10 mg daily, Norvasc  5 mg daily. Hyperlipidemia, continue Lipitor 40 mg Shortness of breath on exertion.  Echo with normal systolic and diastolic function, Lexiscan  Myoview  with no ischemia.  Referred to pulmonary medicine for emphysema workup due to previous smoking x 25+ years.  Follow-up in 6 months.     Medication Adjustments/Labs and Tests Ordered: Current medicines are reviewed at length with the patient today.  Concerns regarding medicines are outlined above.  Orders Placed This Encounter  Procedures   Ambulatory referral to Pulmonology   EKG 12-Lead   No orders of the defined types were placed in this encounter.   Patient Instructions  Medication Instructions:  Your physician recommends that you continue on your current medications as directed. Please refer to the Current Medication list given to you today.   *If you need a refill on your cardiac medications before your next appointment, please call your pharmacy*  Lab Work: No labs ordered today   Testing/Procedures: No test ordered today   Follow-Up: At [facility], you and your health needs are our priority.  As part of our continuing mission to provide you with exceptional heart care, our providers are all part of one team.  This team includes your primary Cardiologist (physician) and Advanced Practice Providers or APPs (Physician Assistants and Nurse Practitioners) who all work together to provide you with the care you need, when you need it.  Your next appointment:   6 month(s)  Provider:   You may see Constancia Delton, MD or one of the following Advanced Practice Providers on your designated Care Team:   Laneta Pintos, NP Gildardo Labrador, PA-C Varney Gentleman, PA-C Cadence [place], PA-C Ronald Cockayne, NP Morey Ar, NP    We recommend signing up for the patient portal called "MyChart".  Sign up information is provided on this After Visit Summary.  MyChart is used to  connect with patients for Virtual Visits (Telemedicine).  Patients are able to view lab/test results, encounter notes, upcoming appointments, etc.  Non-urgent messages can be sent to your provider as  well.   To learn more about what you can do with MyChart, go to [email or web address].      Signed, Constancia Delton, MD  02/24/2024 12:23 PM    [facility]

## 2024-02-24 NOTE — Patient Instructions (Signed)
 Medication Instructions:  Your physician recommends that you continue on your current medications as directed. Please refer to the Current Medication list given to you today.   *If you need a refill on your cardiac medications before your next appointment, please call your pharmacy*  Lab Work: No labs ordered today   Testing/Procedures: No test ordered today   Follow-Up: At [facility], you and your health needs are our priority.  As part of our continuing mission to provide you with exceptional heart care, our providers are all part of one team.  This team includes your primary Cardiologist (physician) and Advanced Practice Providers or APPs (Physician Assistants and Nurse Practitioners) who all work together to provide you with the care you need, when you need it.  Your next appointment:   6 month(s)  Provider:   You may see Constancia Delton, MD or one of the following Advanced Practice Providers on your designated Care Team:   Laneta Pintos, NP Gildardo Labrador, PA-C Varney Gentleman, PA-C Cadence [place], PA-C Ronald Cockayne, NP Morey Ar, NP    We recommend signing up for the patient portal called "MyChart".  Sign up information is provided on this After Visit Summary.  MyChart is used to connect with patients for Virtual Visits (Telemedicine).  Patients are able to view lab/test results, encounter notes, upcoming appointments, etc.  Non-urgent messages can be sent to your provider as well.   To learn more about what you can do with MyChart, go to [email or web address].

## 2024-02-27 DIAGNOSIS — R0683 Snoring: Secondary | ICD-10-CM | POA: Diagnosis not present

## 2024-03-02 ENCOUNTER — Ambulatory Visit: Admitting: Nurse Practitioner

## 2024-03-20 ENCOUNTER — Encounter: Payer: Self-pay | Admitting: Pulmonary Disease

## 2024-03-20 ENCOUNTER — Ambulatory Visit: Admitting: Pulmonary Disease

## 2024-03-20 VITALS — BP 118/68 | HR 65 | Temp 97.1°F | Ht 70.0 in | Wt 160.8 lb

## 2024-03-20 DIAGNOSIS — Z87891 Personal history of nicotine dependence: Secondary | ICD-10-CM

## 2024-03-20 DIAGNOSIS — R0602 Shortness of breath: Secondary | ICD-10-CM | POA: Diagnosis not present

## 2024-03-20 MED ORDER — TRELEGY ELLIPTA 100-62.5-25 MCG/ACT IN AEPB: 1.0000 | INHALATION_SPRAY | Freq: Every day | RESPIRATORY_TRACT | 0 refills | Status: DC

## 2024-03-20 MED ORDER — TRELEGY ELLIPTA 100-62.5-25 MCG/ACT IN AEPB: 1.0000 | INHALATION_SPRAY | Freq: Every day | RESPIRATORY_TRACT | 3 refills | Status: DC

## 2024-03-20 NOTE — Progress Notes (Signed)
 Synopsis: Referred in by Constancia Delton, MD   Subjective:   PATIENT ID: Kevin Esparza GENDER: male DOB: 04/08/1961, MRN: [id number]  Chief Complaint  Patient presents with   Consult    DOE since he was sick in Nov. Wheezing. Cough with clear sputum.     HPI Kevin Esparza is a pleasant 63 year old male patient with a past medical history of heavy tobacco use, hypertension, type 2 diabetes mellitus, GERD presenting today to the pulmonary clinic for ongoing shortness of breath on exertion.  He reports that since November he got a URI along with nausea and vomiting for a period of time of exhaustion shortness of breath on exertion.  He does report intermittent cough worse during early spring when pollen is high.  Does report good appetite.  Takes albuterol  as needed with only minimal relief.  Family history -his grandfather had lung cancer but was a heavy smoker.  Social history -quit smoking in 2005, smoked 3 pack/day for 25 years.  Denies any alcohol use.  He worked as a Naval architect but currently on disability.  He does not have 1 dog at home and chickens in the backyard.  ROS All systems were reviewed and are negative except for the above.  Objective:   Vitals:   03/20/24 1323  BP: 118/68  Pulse: 65  Temp: (!) 97.1 F (36.2 C)  SpO2: 98%  Weight: 160 lb 12.8 oz (72.9 kg)  Height: 5\' 10"  (1.778 m)   98% on RA BMI Readings from Last 3 Encounters:  03/20/24 23.07 kg/m  02/24/24 22.81 kg/m  01/31/24 23.10 kg/m   Wt Readings from Last 3 Encounters:  03/20/24 160 lb 12.8 oz (72.9 kg)  02/24/24 159 lb (72.1 kg)  01/31/24 161 lb (73 kg)    Physical Exam GEN: NAD, Healthy Appearing HEENT: Supple Neck, Reactive Pupils, EOMI  CVS: Normal S1, Normal S2, RRR, No murmurs or ES appreciated  Lungs: poor air movement bilaterally.  Abdomen: Soft, non tender, non distended, + BS  Extremities: Warm and well perfused, No edema  Skin: No suspicious lesions  appreciated  Psych: Normal Affect  Ancillary Information   CBC    Component Value Date/Time   WBC 6.5 01/02/2024 0948   RBC 5.38 01/02/2024 0948   HGB 14.8 01/02/2024 0948   HGB 14.7 04/20/2014 1206   HCT 43.6 01/02/2024 0948   HCT 43.8 04/20/2014 1206   PLT 225.0 01/02/2024 0948   PLT 142 (L) 04/20/2014 1206   MCV 81.1 01/02/2024 0948   MCV 87 04/20/2014 1206   MCH 28.9 10/21/2023 0433   MCHC 33.8 01/02/2024 0948   RDW 14.6 01/02/2024 0948   RDW 13.6 04/20/2014 1206   LYMPHSABS 1.5 01/02/2024 0948   LYMPHSABS 1.8 04/20/2014 1206   MONOABS 0.9 01/02/2024 0948   MONOABS 0.6 04/20/2014 1206   EOSABS 0.1 01/02/2024 0948   EOSABS 0.1 04/20/2014 1206   BASOSABS 0.0 01/02/2024 0948   BASOSABS 0.0 04/20/2014 1206   Labs and imaging were reviewed.      No data to display           Assessment & Plan:  Kevin Esparza is a pleasant 63 year old male patient with a past medical history of heavy tobacco use, hypertension, type 2 diabetes mellitus, GERD presenting today to the pulmonary clinic for ongoing shortness of breath on exertion.  #Dyspnea and feeling of exhaustion Suspecting some component of COPD and possibly asthma overlap.  Heavy smoker w/ family hx of lung cancer,  will obtain a CT chest to rule out any parenchymal lung disease.   []  PFTs.  []  CT chest wo contrast.  []  Start fluticasone-vilanterol-Umecledinium [Trelegy] 100 1 puff daily.    Return in about 4 months (around 07/21/2024).  I spent 60 minutes caring for this patient today, including preparing to see the patient, obtaining a medical history , reviewing a separately obtained history, performing a medically appropriate examination and/or evaluation, counseling and educating the patient/family/caregiver, ordering medications, tests, or procedures, documenting clinical information in the electronic health record, and independently interpreting results (not separately reported/billed) and communicating results  to the patient/family/caregiver  Annitta Kindler, MD [facility] 03/22/2024 2:26 PM

## 2024-03-22 ENCOUNTER — Telehealth: Payer: Self-pay

## 2024-03-22 DIAGNOSIS — J4489 Other specified chronic obstructive pulmonary disease: Secondary | ICD-10-CM

## 2024-03-22 NOTE — Telephone Encounter (Signed)
 Copied from CRM [id number]. Topic: Clinical - Prescription Issue >> Mar 22, 2024  3:10 PM Margarette Shawl wrote: Reason for CRM:   Kevin Esparza, with [facility], is calling regarding the pt's prescribed Trelegy. The copay for medication is $594 and he is unable to afford this.  Requesting alternative medication be called in.   CB#  [phone]  FX#  [phone]

## 2024-03-23 MED ORDER — BREZTRI AEROSPHERE 160-9-4.8 MCG/ACT IN AERO: 2.0000 | INHALATION_SPRAY | Freq: Two times a day (BID) | RESPIRATORY_TRACT | 3 refills | Status: DC

## 2024-03-27 ENCOUNTER — Inpatient Hospital Stay: Admission: RE | Admit: 2024-03-27 | Source: Ambulatory Visit

## 2024-03-27 NOTE — Telephone Encounter (Signed)
NA. LMTCB 

## 2024-03-27 NOTE — Telephone Encounter (Signed)
 Patient reports that co-pay for medication is still too much for him. Pharmacist reports that patient does have a deductible. I advised patient to call insurance and set-up a payment plan. And call the pharmacy to let them know. NFN.

## 2024-04-02 ENCOUNTER — Encounter: Payer: Self-pay | Admitting: Internal Medicine

## 2024-04-03 ENCOUNTER — Encounter
Admission: RE | Disposition: A | Discharge status: Home / Self Care | Payer: Self-pay | Source: Home / Self Care | Attending: Internal Medicine

## 2024-04-03 ENCOUNTER — Ambulatory Visit: Admitting: Anesthesiology

## 2024-04-03 ENCOUNTER — Encounter: Payer: Self-pay | Admitting: Internal Medicine

## 2024-04-03 ENCOUNTER — Ambulatory Visit
Admission: RE | Admit: 2024-04-03 | Discharge: 2024-04-03 | Disposition: A | Discharge status: Home / Self Care | Attending: Internal Medicine | Admitting: Internal Medicine

## 2024-04-03 ENCOUNTER — Other Ambulatory Visit: Payer: Self-pay

## 2024-04-03 DIAGNOSIS — I4891 Unspecified atrial fibrillation: Secondary | ICD-10-CM | POA: Diagnosis not present

## 2024-04-03 DIAGNOSIS — D128 Benign neoplasm of rectum: Secondary | ICD-10-CM | POA: Diagnosis not present

## 2024-04-03 DIAGNOSIS — D125 Benign neoplasm of sigmoid colon: Secondary | ICD-10-CM | POA: Diagnosis not present

## 2024-04-03 DIAGNOSIS — Z7984 Long term (current) use of oral hypoglycemic drugs: Secondary | ICD-10-CM | POA: Insufficient documentation

## 2024-04-03 DIAGNOSIS — K3189 Other diseases of stomach and duodenum: Secondary | ICD-10-CM | POA: Diagnosis not present

## 2024-04-03 DIAGNOSIS — R1314 Dysphagia, pharyngoesophageal phase: Secondary | ICD-10-CM | POA: Diagnosis not present

## 2024-04-03 DIAGNOSIS — Z7901 Long term (current) use of anticoagulants: Secondary | ICD-10-CM | POA: Insufficient documentation

## 2024-04-03 DIAGNOSIS — K219 Gastro-esophageal reflux disease without esophagitis: Secondary | ICD-10-CM | POA: Diagnosis not present

## 2024-04-03 DIAGNOSIS — K2970 Gastritis, unspecified, without bleeding: Secondary | ICD-10-CM | POA: Diagnosis not present

## 2024-04-03 DIAGNOSIS — I509 Heart failure, unspecified: Secondary | ICD-10-CM | POA: Insufficient documentation

## 2024-04-03 DIAGNOSIS — K640 First degree hemorrhoids: Secondary | ICD-10-CM | POA: Insufficient documentation

## 2024-04-03 DIAGNOSIS — K621 Rectal polyp: Secondary | ICD-10-CM | POA: Diagnosis not present

## 2024-04-03 DIAGNOSIS — I110 Hypertensive heart disease with heart failure: Secondary | ICD-10-CM | POA: Diagnosis not present

## 2024-04-03 DIAGNOSIS — K529 Noninfective gastroenteritis and colitis, unspecified: Secondary | ICD-10-CM | POA: Insufficient documentation

## 2024-04-03 DIAGNOSIS — Z8673 Personal history of transient ischemic attack (TIA), and cerebral infarction without residual deficits: Secondary | ICD-10-CM | POA: Insufficient documentation

## 2024-04-03 DIAGNOSIS — Z1211 Encounter for screening for malignant neoplasm of colon: Secondary | ICD-10-CM | POA: Insufficient documentation

## 2024-04-03 DIAGNOSIS — Z85028 Personal history of other malignant neoplasm of stomach: Secondary | ICD-10-CM | POA: Insufficient documentation

## 2024-04-03 DIAGNOSIS — E119 Type 2 diabetes mellitus without complications: Secondary | ICD-10-CM | POA: Insufficient documentation

## 2024-04-03 HISTORY — PX: ESOPHAGOGASTRODUODENOSCOPY: SHX5428

## 2024-04-03 HISTORY — PX: COLONOSCOPY: SHX5424

## 2024-04-03 LAB — GLUCOSE, CAPILLARY: Glucose-Capillary: 128 mg/dL — ABNORMAL HIGH (ref 70–99)

## 2024-04-03 SURGERY — COLONOSCOPY
Anesthesia: General

## 2024-04-03 MED ORDER — PROPOFOL 10 MG/ML IV BOLUS
INTRAVENOUS | Status: DC | PRN
Start: 2024-04-03 — End: 2024-04-03
  Administered 2024-04-03: 40 mg via INTRAVENOUS
  Administered 2024-04-03: 110 mg via INTRAVENOUS
  Administered 2024-04-03 (×5): 40 mg via INTRAVENOUS
  Administered 2024-04-03: 50 mg via INTRAVENOUS
  Administered 2024-04-03: 40 mg via INTRAVENOUS

## 2024-04-03 MED ORDER — LIDOCAINE HCL (CARDIAC) PF 100 MG/5ML IV SOSY
PREFILLED_SYRINGE | INTRAVENOUS | Status: DC | PRN
  Administered 2024-04-03: 100 mg via INTRAVENOUS

## 2024-04-03 MED ORDER — SODIUM CHLORIDE 0.9 % IV SOLN
INTRAVENOUS | Status: DC
  Administered 2024-04-03: 500 mL via INTRAVENOUS

## 2024-04-03 NOTE — Anesthesia Preprocedure Evaluation (Addendum)
 Anesthesia Evaluation  Patient identified by MRN, date of birth, ID band Patient awake    Reviewed: Allergy & Precautions, NPO status , Patient's Chart, lab work & pertinent test results  History of Anesthesia Complications Negative for: history of anesthetic complications  Airway Mallampati: III   Neck ROM: Full    Dental  (+) Edentulous Upper, Edentulous Lower   Pulmonary former smoker (quit 2005)   Pulmonary exam normal breath sounds clear to auscultation       Cardiovascular hypertension, +CHF  Normal cardiovascular exam+ dysrhythmias (a fib on Eliquis )  Rhythm:Regular Rate:Normal  Myocardial perfusion 03/25/22:  Pharmacological myocardial perfusion imaging study with no significant  ischemia Normal wall motion, EF estimated at 68% No EKG changes concerning for ischemia at peak stress or in recovery. Resting EKG with nonspecific ST abnormality V3 through V6, 2, 3, aVF CT attenuation correction images with mild aortic atherosclerosis, no significant coronary calcification Low risk scan    Neuro/Psych   Anxiety     CVA (few years ago; no residual deficits)    GI/Hepatic ,GERD  ,,Stomach CA   Endo/Other  diabetes, Type 2    Renal/GU    BPH    Musculoskeletal  (+) Arthritis ,    Abdominal   Peds  Hematology negative hematology ROS (+)   Anesthesia Other Findings Cardiology note 02/24/24:  1. Preop evaluation, colonoscopy being planned.  Echo 02/21/2024 shows normal systolic and diastolic function EF 55 to 60%, prior Lexiscan  Myoview  with no ischemia.  Okay for procedure from a cardiac perspective.  Colonoscopy usually deemed low risk. 2. Paroxysmal atrial fibrillation.  Denies palpitations.  EKG showing sinus rhythm.  Echo 9/23 EF 60 to 65% continue Toprol -XL 25 mg daily, Eliquis  5 mg twice daily.   3. Hypertension, BP controlled.  Continue Toprol -XL 25 mg daily, Vasotec  10 mg daily, Norvasc  5 mg  daily. 4. Hyperlipidemia, continue Lipitor 40 mg 5. Shortness of breath on exertion.  Echo with normal systolic and diastolic function, Lexiscan  Myoview  with no ischemia.  Referred to pulmonary medicine for emphysema workup due to previous smoking x 25+ years.   Follow-up in 6 months.   Reproductive/Obstetrics                             Anesthesia Physical Anesthesia Plan  ASA: 3  Anesthesia Plan: General   Post-op Pain Management:    Induction: Intravenous  PONV Risk Score and Plan: 2 and Propofol  infusion, TIVA and Treatment may vary due to age or medical condition  Airway Management Planned: Natural Airway  Additional Equipment:   Intra-op Plan:   Post-operative Plan:   Informed Consent: I have reviewed the patients History and Physical, chart, labs and discussed the procedure including the risks, benefits and alternatives for the proposed anesthesia with the patient or authorized representative who has indicated his/her understanding and acceptance.       Plan Discussed with: CRNA  Anesthesia Plan Comments: (LMA/GETA backup discussed.  Patient consented for risks of anesthesia including but not limited to:  - adverse reactions to medications - damage to eyes, teeth, lips or other oral mucosa - nerve damage due to positioning  - sore throat or hoarseness - damage to heart, brain, nerves, lungs, other parts of body or loss of life  Informed patient about role of CRNA in peri- and intra-operative care.  Patient voiced understanding.)        Anesthesia Quick Evaluation

## 2024-04-03 NOTE — Anesthesia Postprocedure Evaluation (Signed)
 Anesthesia Post Note  Patient: Kevin Esparza  Procedure(s) Performed: COLONOSCOPY EGD (ESOPHAGOGASTRODUODENOSCOPY)  Patient location during evaluation: PACU Anesthesia Type: General Level of consciousness: awake and alert, oriented and patient cooperative Pain management: pain level controlled Vital Signs Assessment: post-procedure vital signs reviewed and stable Respiratory status: spontaneous breathing, nonlabored ventilation and respiratory function stable Cardiovascular status: blood pressure returned to baseline and stable Postop Assessment: adequate PO intake Anesthetic complications: no   No notable events documented.   Last Vitals:  Vitals:   04/03/24 0820 04/03/24 0944  BP: (!) 154/71 (!) 94/51  Pulse: (!) 58   Resp: 16   Temp: 36.6 C 36.4 C  SpO2: 99%     Last Pain:  Vitals:   04/03/24 1004  TempSrc:   PainSc: 0-No pain                 Dorothey Gate

## 2024-04-03 NOTE — Op Note (Signed)
 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 Corky Diener MD, MD Referring MD:          Aneita Baptise (Referring MD) Medicines:             Propofol  per Anesthesia Complications:         No immediate complications. Estimated blood loss: None. Procedure:             Pre-Anesthesia Assessment:                        - The risks and benefits of the procedure and the                         sedation options and risks were discussed with the                         patient. All questions were answered and informed                         consent was obtained.                        - Patient identification and proposed procedure were                         verified prior to the procedure by the nurse. The                         procedure was verified in the procedure room.                        - ASA Grade Assessment: III - A patient with severe                         systemic disease.                        - After reviewing the risks and benefits, the patient                         was deemed in satisfactory condition to undergo the                         procedure.  After obtaining informed consent, the colonoscope was                         passed under direct vision. Throughout the procedure,                         the patient's blood pressure, pulse, and oxygen                         saturations were monitored continuously. The                          Colonoscope was introduced through the anus and                         advanced to the the cecum, identified by appendiceal                         orifice and ileocecal valve. The colonoscopy was                         performed without difficulty. The patient tolerated                         the procedure well. The quality of the bowel                         preparation was good. The ileocecal valve, appendiceal                         orifice, and rectum were photographed. Findings:      The perianal and digital rectal examinations were normal. Pertinent       negatives include normal sphincter tone and no palpable rectal lesions.      Non-bleeding internal hemorrhoids were found during retroflexion. The       hemorrhoids were Grade I (internal hemorrhoids that do not prolapse).      Three sessile and semi-pedunculated polyps were found in the sigmoid       colon. The polyps were 8 to 16 mm in size. These polyps were removed       with a hot snare. Resection and retrieval were complete.      Two sessile polyps were found in the rectum. The polyps were 5 to 10 mm       in size. These polyps were removed with a hot snare. Resection and       retrieval were complete.      The exam was otherwise without abnormality. Impression:            - Non-bleeding internal hemorrhoids.                        - Three 8 to 16 mm polyps in the sigmoid colon,                         removed with a hot snare. Resected and retrieved.                        - Two 5 to 10 mm polyps in the rectum, removed with  a                         hot snare. Resected and retrieved.                        - The examination was otherwise normal. Recommendation:        - Await pathology results from EGD, also performed                         today.                        - Monitor results to esophageal dilation                        - Patient has a contact number available for                          emergencies. The signs and symptoms of potential                         delayed complications were discussed with the patient.                         Return to normal activities tomorrow. Written                         discharge instructions were provided to the patient.                        - Resume previous diet.                        - Continue present medications.                        - Repeat colonoscopy is recommended for surveillance.                         The colonoscopy date will be determined after                         pathology results from today's exam become available                         for review.                        - Return to my office in 2 months.                        - Telephone GI office to schedule appointment.                        - The findings and recommendations were discussed with                         the patient. Procedure Code(s):     --- Professional ---  16109, Colonoscopy, flexible; with removal of                         tumor(s), polyp(s), or other lesion(s) by snare                         technique Diagnosis Code(s):     --- Professional ---                        K64.0, First degree hemorrhoids                        D12.8, Benign neoplasm of rectum                        D12.5, Benign neoplasm of sigmoid colon                        Z86.010, Personal history of colonic polyps CPT copyright 2022 American [employer]. All rights reserved. The codes documented in this report are preliminary and upon coder review may  be revised to meet current compliance requirements. Cassie Click MD, MD 04/03/2024 9:46:57 AM This report has been signed electronically. Number of Addenda: 0 Note Initiated On: 04/03/2024 9:02 AM Scope Withdrawal Time: 0 hours 8 minutes 29 seconds  Total Procedure Duration: 0 hours 19 minutes 43 seconds  Estimated Blood Loss:  Estimated blood loss: none.      [facility]

## 2024-04-03 NOTE — Transfer of Care (Signed)
 Immediate Anesthesia Transfer of Care Note  Patient: Kevin Esparza  Procedure(s) Performed: COLONOSCOPY EGD (ESOPHAGOGASTRODUODENOSCOPY)  Patient Location: Endoscopy Unit  Anesthesia Type:General  Level of Consciousness: drowsy  Airway & Oxygen Therapy: Patient Spontanous Breathing  Post-op Assessment: Report given to RN, Post -op Vital signs reviewed and stable, and Patient moving all extremities  Post vital signs: Reviewed and stable  Last Vitals:  Vitals Value Taken Time  BP 94/51 04/03/24 0944  Temp    Pulse 53 04/03/24 0945  Resp 20 04/03/24 0945  SpO2 99 % 04/03/24 0945  Vitals shown include unfiled device data.  Last Pain:  Vitals:   04/03/24 0820  TempSrc: Oral  PainSc: 8       Patients Stated Pain Goal: 8 (04/03/24 0820)  Complications: No notable events documented.

## 2024-04-03 NOTE — H&P (Signed)
 Outpatient short stay form Pre-procedure 04/03/2024 8:55 AM Kevin Esparza, M.D.  Primary Physician: Kevin Baptise, PA  Reason for visit:  Chronic diarrhea  History of present illness:  Kevin Esparza presents today for hospital follow-up from December 2024 which time he had intractable nausea and vomiting. Patient's nausea and vomiting has completely resolved. He underwent a gastric emptying study earlier this year ordered by Kevin Esparza which was normal. The patient has persistent diarrhea of an unknown type. He does have history of neuroendocrine cell tumor in the cecum and the stomach. Patient can have up to 15 loose stools per day and on a "good day" he will have 5 or 6 he takes over-the-counter antidiarrheals which can help stop bowel movements for up to 24 hours but then bowel movement looseness along with increased frequency follows. There is no hematochezia. Patient has intermittent dysphagia to solids without hemetemesis or abdominal pain.    Current Facility-Administered Medications:    0.9 %  sodium chloride  infusion, , Intravenous, Continuous, [place], Girl Schissler K, MD, Last Rate: 20 mL/hr at 04/03/24 0843, 500 mL at 04/03/24 0843  Medications Prior to Admission  Medication Sig Dispense Refill Last Dose/Taking   acetaminophen  (TYLENOL ) 650 MG CR tablet Take 1,300 mg by mouth See admin instructions. Take 1300 mg in the morning, may take a second 1300 mg dose during the day as needed for pain   04/02/2024   amLODipine  (NORVASC ) 5 MG tablet Take 1 tablet (5 mg total) by mouth daily. 30 tablet 0 04/02/2024   azelastine (ASTELIN) 0.1 % nasal spray Place 1 spray into both nostrils as needed.   Past Week   enalapril  (VASOTEC ) 10 MG tablet Take 1 tablet (10 mg total) by mouth daily. 30 tablet 0 04/02/2024   finasteride  (PROSCAR ) 5 MG tablet Take 1 tablet (5 mg total) by mouth daily. 30 tablet 11 04/02/2024 at  9:00 PM   KLOR-CON  M20 20 MEQ tablet Take by mouth.   04/02/2024   metFORMIN  (GLUCOPHAGE) 1000 MG tablet Take 1,000 mg by mouth daily with breakfast.   04/02/2024   metoprolol  succinate (TOPROL -XL) 25 MG 24 hr tablet Take 1 tablet by mouth in the evening 90 tablet 1 04/02/2024   omeprazole  (PRILOSEC ) 20 MG capsule Take 20 mg by mouth 2 (two) times daily.   04/02/2024   tamsulosin  (FLOMAX ) 0.4 MG CAPS capsule Take 1 capsule (0.4 mg total) by mouth daily. 90 capsule 3 04/02/2024   tiZANidine  (ZANAFLEX ) 2 MG tablet Take 2 mg by mouth 3 (three) times daily.   04/02/2024 at  9:00 PM   albuterol  (VENTOLIN  HFA) 108 (90 Base) MCG/ACT inhaler Inhale 2 puffs into the lungs every 6 (six) hours as needed for wheezing or shortness of breath.      apixaban  (ELIQUIS ) 5 MG TABS tablet Take 1 tablet (5 mg total) by mouth 2 (two) times daily.   03/30/2024   budesonide-glycopyrrolate -formoterol (BREZTRI  AEROSPHERE) 160-9-4.8 MCG/ACT AERO inhaler Inhale 2 puffs into the lungs 2 (two) times daily. 1 each 3    colchicine  0.6 MG tablet Take 0.6 mg by mouth 2 (two) times daily as needed (gout).      cyanocobalamin  (VITAMIN B12) 1000 MCG/ML injection       meclizine  (ANTIVERT ) 25 MG tablet Take 25 mg by mouth every 6 (six) hours as needed.      ondansetron  (ZOFRAN ) 8 MG tablet Take 8 mg by mouth 3 (three) times daily.      Potassium Chloride  ER 20 MEQ TBCR  Take 1 tablet by mouth daily.        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    Physical Exam  Gen: Alert,  oriented. Appears stated age.  HEENT: [place]/AT. PERRLA. Lungs: CTA, no wheezes. CV: RR nl S1, S2. Abd: soft, benign, no masses. BS+ Ext: No edema. Pulses 2+    Planned procedures: Proceed with EGD and colonoscopy. The patient understands the nature of the planned procedure, indications, risks, alternatives and potential complications including but not limited to bleeding, infection, perforation, damage to internal organs and possible oversedation/side effects from anesthesia. The patient agrees and gives consent to proceed.  Please refer to procedure notes for findings, recommendations and patient disposition/instructions.     Kevin Esparza, M.D. Gastroenterology 04/03/2024  8:55 AM

## 2024-04-03 NOTE — Interval H&P Note (Signed)
 History and Physical Interval Note:  04/03/2024 8:58 AM  Kevin Esparza  has presented today for surgery, with the diagnosis of History of colon polyps (Z86.0100) Schatzki's ring (K22.2) Esophageal dysphagia (R13.19).  The various methods of treatment have been discussed with the patient and family. After consideration of risks, benefits and other options for treatment, the patient has consented to  Procedure(s) with comments: COLONOSCOPY (N/A) - DM and Patient is on Eliquis  EGD (ESOPHAGOGASTRODUODENOSCOPY) (N/A) as a surgical intervention.  The patient's history has been reviewed, patient examined, no change in status, stable for surgery.  I have reviewed the patient's chart and labs.  Questions were answered to the patient's satisfaction.     [place], Shawnelle Spoerl

## 2024-04-03 NOTE — Op Note (Signed)
 [facility] Gastroenterology Patient Name: Kevin Esparza Procedure Date: 04/03/2024 9:03 AM MRN: [id number] Account #: [id number] Date of Birth: 1961-03-22 Admit Type: Outpatient Age: 63 Room: [facility] ENDO ROOM 3 Gender: Male Note Status: Finalized Instrument Name: Upper Endoscope [id number] Procedure:             Upper GI endoscopy Indications:           Esophageal dysphagia, Gastro-esophageal reflux disease Providers:             Carlee Tesfaye K. Corky Diener MD, MD Referring MD:          Aneita Baptise (Referring MD) Medicines:             Propofol  per Anesthesia Complications:         No immediate complications. Estimated blood loss:                         Minimal. Procedure:             Pre-Anesthesia Assessment:                        - The risks and benefits of the procedure and the                         sedation options and risks were discussed with the                         patient. All questions were answered and informed                         consent was obtained.                        - Patient identification and proposed procedure were                         verified prior to the procedure by the nurse. The                         procedure was verified in the procedure room.                        - ASA Grade Assessment: III - A patient with severe                         systemic disease.                        - After reviewing the risks and benefits, the patient                         was deemed in satisfactory condition to undergo the                         procedure.                        After obtaining informed consent, the endoscope was                         passed under  direct vision. Throughout the procedure,                         the patient's blood pressure, pulse, and oxygen                         saturations were monitored continuously. The Endoscope                         was introduced through the mouth, and advanced to the                          third part of duodenum. The upper GI endoscopy was                         accomplished without difficulty. The patient tolerated                         the procedure well. Findings:      The examined esophagus was normal.      No endoscopic abnormality was evident in the esophagus to explain the       patient's complaint of dysphagia. It was decided, however, to proceed       with dilation in the distal esophagus. The scope was withdrawn. Dilation       was performed with a Maloney dilator with no resistance at 54 Fr.      Scattered mild inflammation characterized by congestion (edema) and       erythema was found in the gastric antrum.      A single 16 mm submucosal papule (nodule) with no bleeding and no       stigmata of recent bleeding was found in the prepyloric region of the       stomach. Biopsies were taken with a cold forceps for histology.       Estimated blood loss was minimal.      The exam of the stomach was otherwise normal.      The examined duodenum was normal. Impression:            - Normal esophagus.                        - No endoscopic esophageal abnormality to explain                         patient's dysphagia. Esophagus dilated. Dilated.                        - Gastritis.                        - A single submucosal papule (nodule) found in the                         stomach. Biopsied.                        - Normal examined duodenum. Recommendation:        - Await pathology results.                        - Monitor  results to esophageal dilation                        - Proceed with colonoscopy Procedure Code(s):     --- Professional ---                        [id number], Esophagogastroduodenoscopy, flexible,                         transoral; with biopsy, single or multiple                        43450, Dilation of esophagus, by unguided sound or                         bougie, single or multiple passes Diagnosis Code(s):     --- Professional  ---                        K21.9, Gastro-esophageal reflux disease without                         esophagitis                        R13.14, Dysphagia, pharyngoesophageal phase                        K31.89, Other diseases of stomach and duodenum                        K29.70, Gastritis, unspecified, without bleeding CPT copyright 2022 American Medical Association. All rights reserved. The codes documented in this report are preliminary and upon coder review may  be revised to meet current compliance requirements. Cassie Click MD, MD 04/03/2024 9:18:35 AM This report has been signed electronically. Number of Addenda: 0 Note Initiated On: 04/03/2024 9:03 AM Estimated Blood Loss:  Estimated blood loss was minimal.      [facility]

## 2024-04-04 LAB — SURGICAL PATHOLOGY

## 2024-04-16 ENCOUNTER — Ambulatory Visit
Admission: RE | Admit: 2024-04-16 | Discharge: 2024-04-16 | Disposition: A | Discharge status: Home / Self Care | Source: Ambulatory Visit | Attending: Pulmonary Disease | Admitting: Pulmonary Disease

## 2024-04-16 DIAGNOSIS — R0602 Shortness of breath: Secondary | ICD-10-CM

## 2024-04-30 IMAGING — CT CT ANGIO CHEST
2 of 7 series · 18 of 46 positions shown · IV contrast (APPLIED)
Comparison: Radiographs dated March 24, 2022; PET-CT dated May 08, 2021

CLINICAL DATA: Substernal chest pain with shortness of breath.
Nausea and diaphoresis since last night.

EXAM:
CT ANGIOGRAPHY CHEST WITH CONTRAST
TECHNIQUE: Multidetector CT imaging of the chest was performed using the
standard protocol during bolus administration of intravenous
contrast. Multiplanar CT image reconstructions and MIPs were
obtained to evaluate the vascular anatomy.

[Series 5: coronal mpr · coronal · 0.63mm/px · 2 of 90 slices shown]
[im 30/90  soft-tissue]
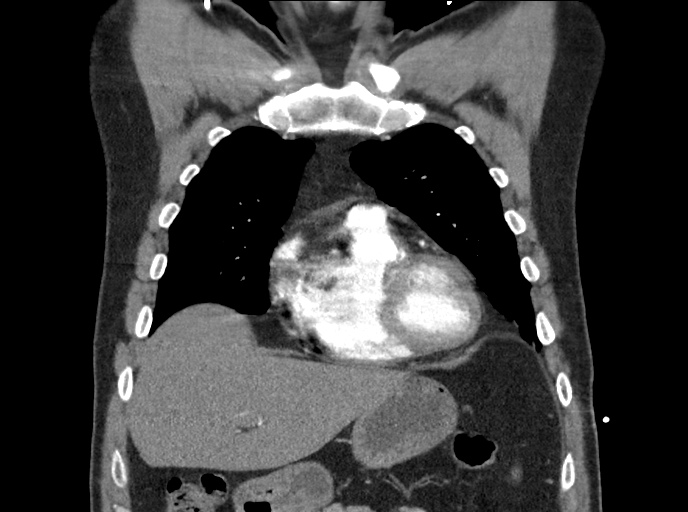
[im 60/90  soft-tissue]
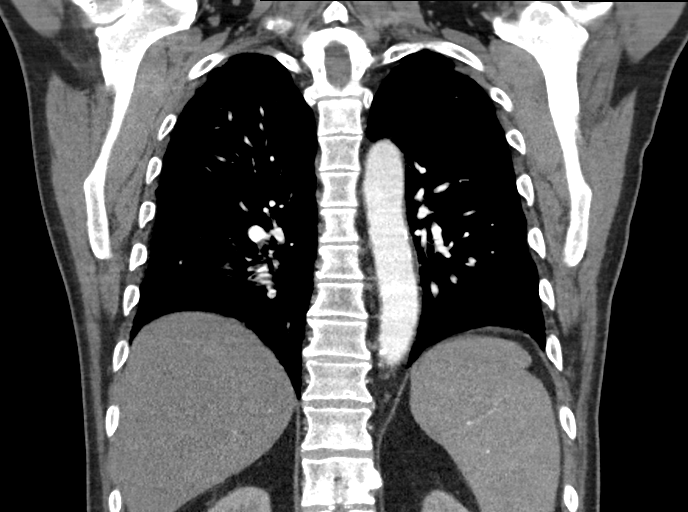

[Series 9: thins · axial · 0.80mm/px · z∈[-706,-418]mm · 16 of 325 slices shown]
[im 19/325  lung]
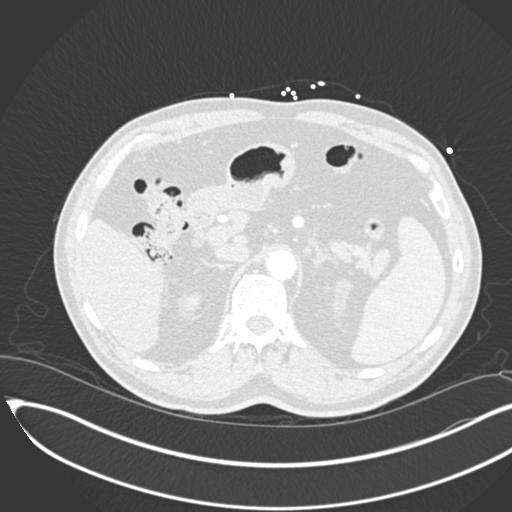
[im 37/325  soft-tissue]
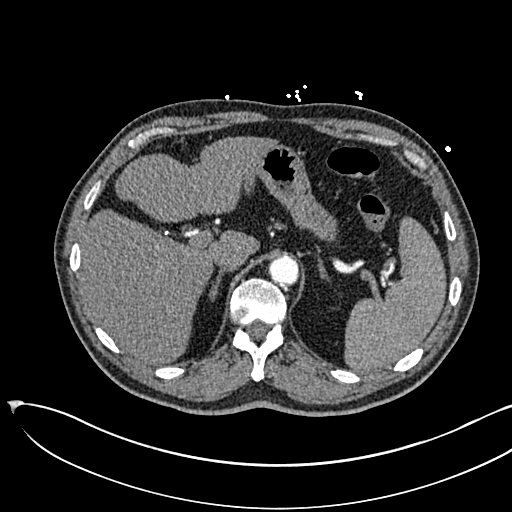
[im 55/325  lung]
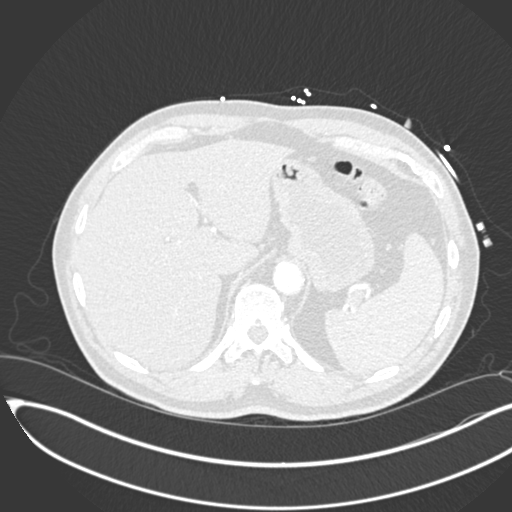
[im 73/325  soft-tissue]
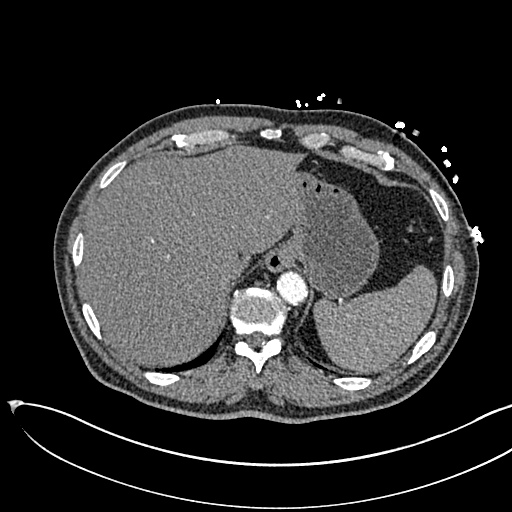
[im 91/325  lung]
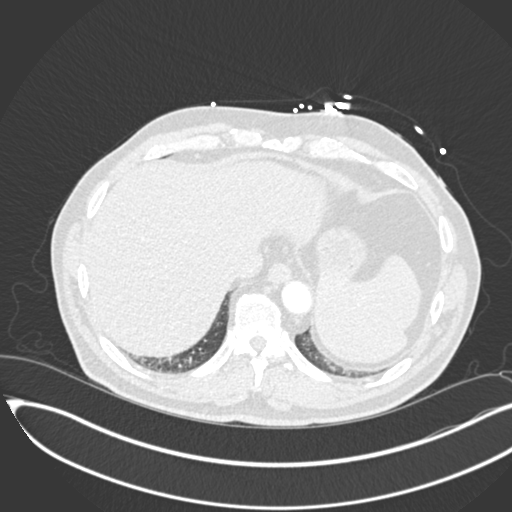
[im 109/325  soft-tissue]
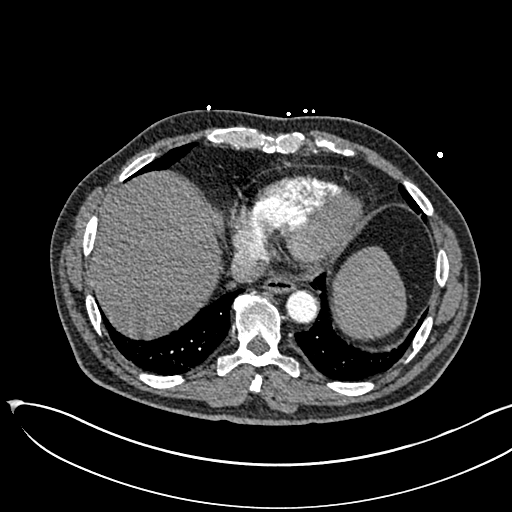
[im 127/325  lung]
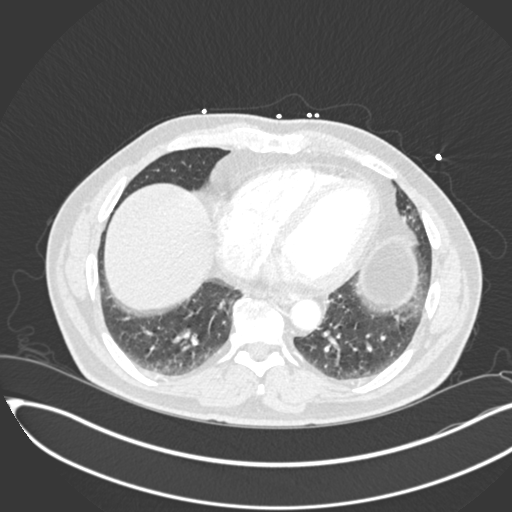
[im 145/325  soft-tissue]
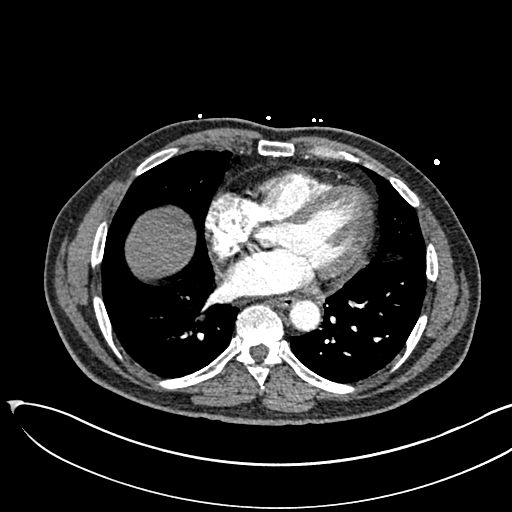
[im 181/325  lung]
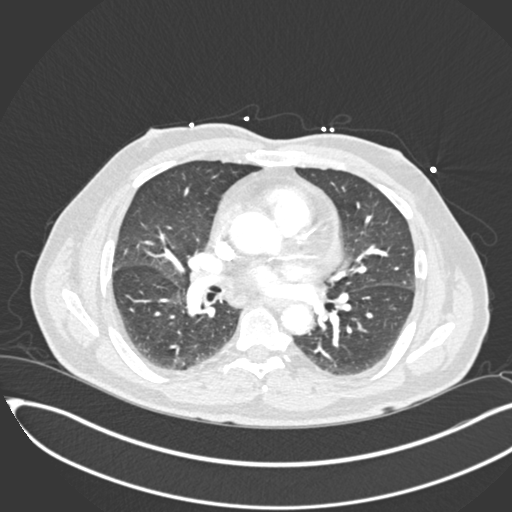
[im 199/325  soft-tissue]
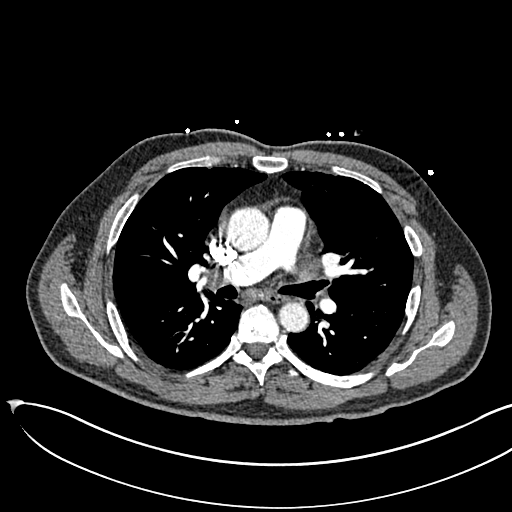
[im 217/325  lung]
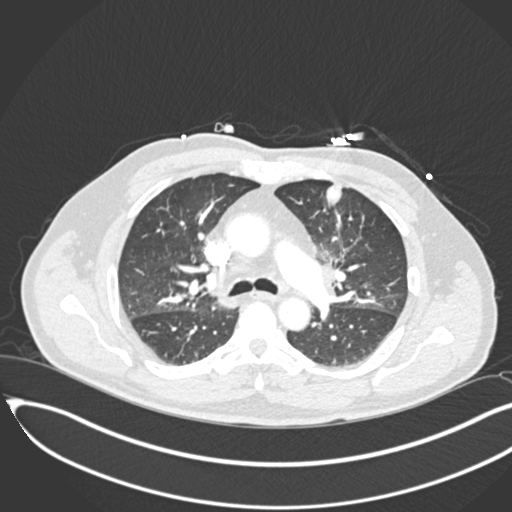
[im 235/325  soft-tissue]
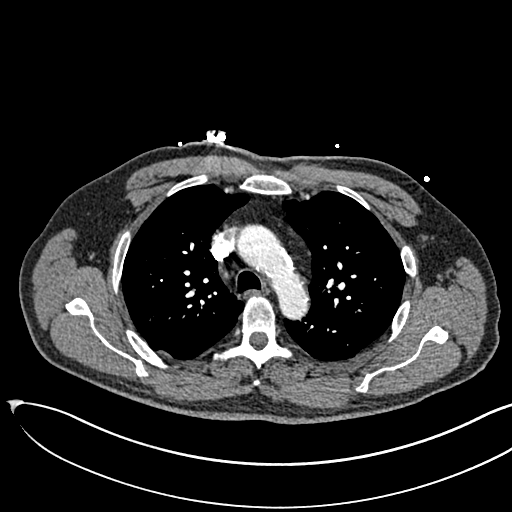
[im 253/325  lung]
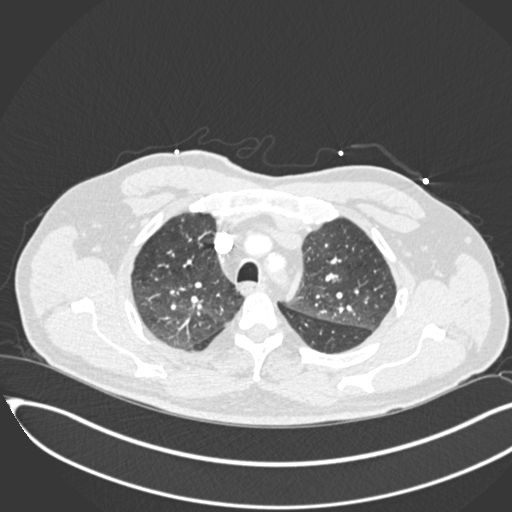
[im 271/325  soft-tissue]
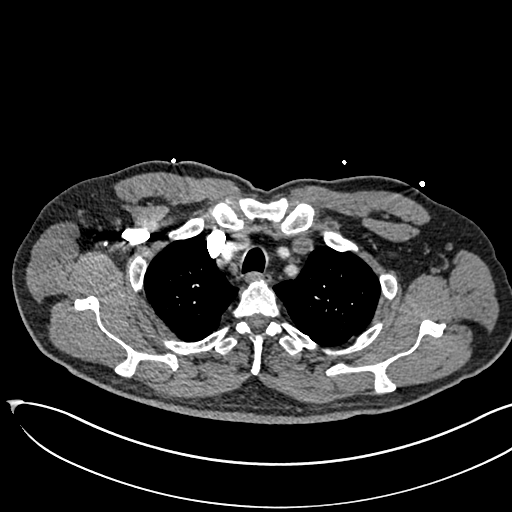
[im 289/325  lung]
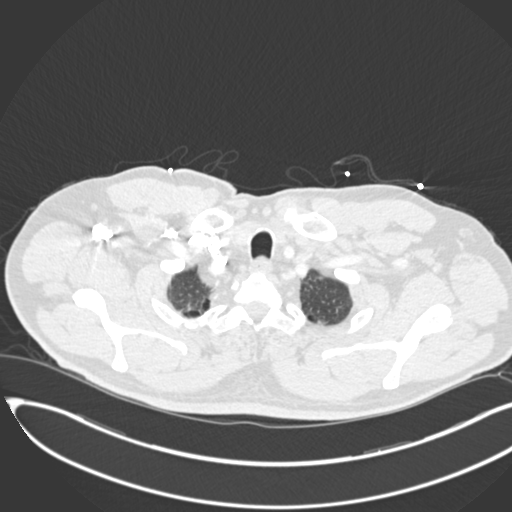
[im 307/325  soft-tissue]
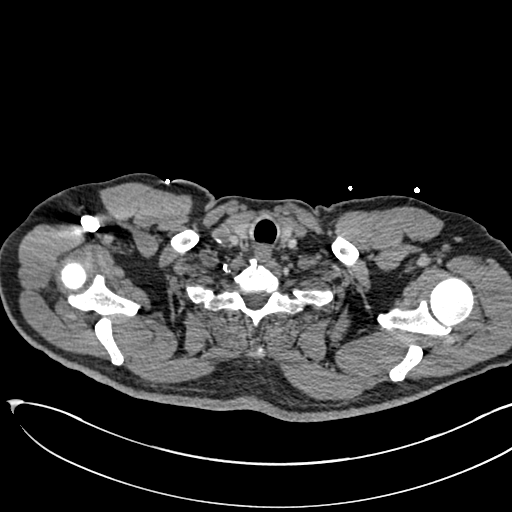

[18 of 46 positions shown; findings below may reference images not displayed]

RADIATION DOSE REDUCTION: This exam was performed according to the
departmental dose-optimization program which includes automated
exposure control, adjustment of the mA and/or kV according to
patient size and/or use of iterative reconstruction technique.

CONTRAST:  75mL OMNIPAQUE IOHEXOL 350 MG/ML SOLN
FINDINGS: Cardiovascular: Satisfactory opacification of the pulmonary arteries
to the segmental level. No evidence of pulmonary embolism. Normal
heart size. No pericardial effusion. Coronary artery atherosclerotic
calcifications. Aorta is normal in caliber with atherosclerotic
calcification of the aortic arch.

Mediastinum/Nodes: No enlarged mediastinal, hilar, or axillary lymph
nodes. Thyroid gland, trachea, and esophagus demonstrate no
significant findings.

Lungs/Pleura: 1.0 x 1.6 x 1.2 cm ovoid opacity in the anterior
aspect of the left upper lobe. This nodule was present on prior
PET-CT examination May 08, 2021 and has not significantly changed
since prior examination. Lungs otherwise clear without evidence of
pneumonia or pulmonary edema.

Upper Abdomen: Cholecystectomy changes.

Musculoskeletal: No chest wall abnormality. No acute or significant
osseous findings.

Review of the MIP images confirms the above findings.
IMPRESSION: 1.  No evidence of pulmonary embolism.

2.  No evidence of pneumonia or pulmonary edema.

3. 1.0 x 1.6 x 1.2 cm ovoid opacity in the anterior aspect of the
left upper lobe. This nodule was present on prior PET-CT examination
May 08, 2021 and has not significantly changed since. Recommend
follow-up CT in 3 months or PET/CT or Biopsy for further evaluation.
This recommendation follows the consensus statement: "Guidelines for
Management of Small Pulmonary Nodules Detected on CT Scans: A
Statement from the [HOSPITAL]" as published in Radiology

Aortic Atherosclerosis (K581X-7DU.U).

## 2024-05-14 ENCOUNTER — Other Ambulatory Visit: Payer: Self-pay

## 2024-05-14 ENCOUNTER — Ambulatory Visit: Payer: Medicare HMO | Admitting: Physician Assistant

## 2024-05-14 ENCOUNTER — Other Ambulatory Visit

## 2024-05-14 DIAGNOSIS — Z87898 Personal history of other specified conditions: Secondary | ICD-10-CM

## 2024-05-15 LAB — PSA: Prostate Specific Ag, Serum: 1.8 ng/mL (ref 0.0–4.0)

## 2024-05-18 ENCOUNTER — Ambulatory Visit: Payer: Self-pay | Admitting: Physician Assistant

## 2024-05-23 ENCOUNTER — Ambulatory Visit: Admitting: Physician Assistant

## 2024-05-23 VITALS — BP 125/71 | HR 74 | Ht 70.0 in | Wt 160.0 lb

## 2024-05-23 DIAGNOSIS — N138 Other obstructive and reflux uropathy: Secondary | ICD-10-CM

## 2024-05-23 DIAGNOSIS — N401 Enlarged prostate with lower urinary tract symptoms: Secondary | ICD-10-CM

## 2024-05-23 MED ORDER — TAMSULOSIN HCL 0.4 MG PO CAPS: 0.4000 mg | ORAL_CAPSULE | Freq: Every day | ORAL | 3 refills | Status: AC

## 2024-05-23 MED ORDER — FINASTERIDE 5 MG PO TABS: 5.0000 mg | ORAL_TABLET | Freq: Every day | ORAL | 3 refills | Status: AC

## 2024-05-23 NOTE — Progress Notes (Signed)
 05/23/2024 5:22 PM   Ryan Lamar Mt 1961-07-16 [id number]  CC: Chief Complaint  Patient presents with   Benign Prostatic Hypertrophy   HPI: Kevin Esparza is a 63 y.o. male with PMH BPH on Flomax  and finasteride  who presents today for 9-month follow-up.  Notably, at the time of his last visit with Dr. Penne 6 months ago, his PSA had not dropped despite starting finasteride  around July 2024.   Most recent PSA earlier this month was 1.8, previously 2.6 prior to starting finasteride .  Today he reports he is tolerating Flomax  and finasteride  without difficulty.  He is pleased and has no acute concerns regarding his urinary symptoms.  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   Pneumonia    several times   Stomach cancer (HCC)    Stroke [facility])     Surgical History: Past Surgical History:  Procedure Laterality Date   APPENDECTOMY  2005   BIOPSY  03/19/2021   Procedure: BIOPSY;  Surgeon: Wilhelmenia Aloha Raddle., MD;  Location: [facility] ENDOSCOPY;  Service: Gastroenterology;;   BIOPSY  05/13/2022   Procedure: BIOPSY;  Surgeon: Wilhelmenia Aloha Raddle., MD;  Location: [facility] ENDOSCOPY;  Service: Gastroenterology;;   BIOPSY  10/20/2023   Procedure: BIOPSY;  Surgeon: Maryruth Ole DASEN, MD;  Location: [facility] ENDOSCOPY;  Service: Endoscopy;;   CERVICAL SPINE SURGERY  1997   c4-c7   CHOLECYSTECTOMY  2005   COLONOSCOPY N/A 04/03/2024   Procedure: COLONOSCOPY;  Surgeon: Toledo, Ladell POUR, MD;  Location: [facility] ENDOSCOPY;  Service: Gastroenterology;  Laterality: N/A;  DM and Patient  is on Eliquis    COLONOSCOPY WITH PROPOFOL  N/A 12/31/2020   Procedure: COLONOSCOPY WITH PROPOFOL ;  Surgeon: Janalyn Keene NOVAK, MD;  Location: [facility] ENDOSCOPY;  Service: Endoscopy;  Laterality: N/A;   COLONOSCOPY WITH PROPOFOL  N/A 03/19/2021   Procedure: COLONOSCOPY WITH PROPOFOL ;  Surgeon: Wilhelmenia Aloha Raddle., MD;  Location: [facility] ENDOSCOPY;  Service: Gastroenterology;  Laterality: N/A;   COLONOSCOPY WITH PROPOFOL  N/A 05/13/2022   Procedure: COLONOSCOPY WITH PROPOFOL ;  Surgeon: Mansouraty, Aloha Raddle., MD;  Location: [facility] ENDOSCOPY;  Service: Gastroenterology;  Laterality: N/A;   COLONOSCOPY WITH PROPOFOL  N/A 10/20/2023   Procedure: COLONOSCOPY WITH PROPOFOL ;  Surgeon: Maryruth Ole DASEN, MD;  Location: [facility] ENDOSCOPY;  Service: Endoscopy;  Laterality: N/A;   ENDOSCOPIC MUCOSAL RESECTION  03/19/2021   Procedure: ENDOSCOPIC MUCOSAL RESECTION;  Surgeon: Wilhelmenia Aloha Raddle., MD;  Location: [facility] ENDOSCOPY;  Service: Gastroenterology;;   ENDOSCOPIC MUCOSAL RESECTION N/A 05/13/2022   Procedure: ENDOSCOPIC MUCOSAL RESECTION;  Surgeon: Wilhelmenia Aloha Raddle., MD;  Location: [facility] ENDOSCOPY;  Service: Gastroenterology;  Laterality: N/A;   ESOPHAGOGASTRODUODENOSCOPY N/A 04/03/2024   Procedure: EGD (ESOPHAGOGASTRODUODENOSCOPY);  Surgeon: Toledo, Ladell POUR, MD;  Location: [facility] ENDOSCOPY;  Service: Gastroenterology;  Laterality: N/A;   ESOPHAGOGASTRODUODENOSCOPY (EGD) WITH PROPOFOL  N/A 12/31/2020   Procedure: ESOPHAGOGASTRODUODENOSCOPY (EGD) WITH PROPOFOL ;  Surgeon: Janalyn Keene NOVAK, MD;  Location: [facility] ENDOSCOPY;  Service: Endoscopy;  Laterality: N/A;   ESOPHAGOGASTRODUODENOSCOPY (EGD) WITH PROPOFOL  N/A 03/19/2021   Procedure:  ESOPHAGOGASTRODUODENOSCOPY (EGD) WITH PROPOFOL ;  Surgeon: Mansouraty, Aloha Raddle., MD;  Location: [facility] ENDOSCOPY;  Service: Gastroenterology;  Laterality: N/A;   ESOPHAGOGASTRODUODENOSCOPY (EGD) WITH PROPOFOL  N/A 05/13/2022   Procedure: ESOPHAGOGASTRODUODENOSCOPY (EGD) WITH PROPOFOL ;  Surgeon:  Wilhelmenia Aloha Raddle., MD;  Location: [facility] ENDOSCOPY;  Service: Gastroenterology;  Laterality: N/A;   ESOPHAGOGASTRODUODENOSCOPY (EGD) WITH PROPOFOL  N/A 10/20/2023   Procedure: ESOPHAGOGASTRODUODENOSCOPY (EGD) WITH PROPOFOL ;  Surgeon: Maryruth Ole DASEN, MD;  Location: [facility] ENDOSCOPY;  Service: Endoscopy;  Laterality: N/A;   HEMOSTASIS CLIP PLACEMENT  03/19/2021   Procedure: HEMOSTASIS CLIP PLACEMENT;  Surgeon: Wilhelmenia Aloha Raddle., MD;  Location: [facility] ENDOSCOPY;  Service: Gastroenterology;;   HEMOSTASIS CLIP PLACEMENT  05/13/2022   Procedure: HEMOSTASIS CLIP PLACEMENT;  Surgeon: Wilhelmenia Aloha Raddle., MD;  Location: [facility] ENDOSCOPY;  Service: Gastroenterology;;   MULTIPLE TOOTH EXTRACTIONS     dentures   POLYPECTOMY  03/19/2021   Procedure: POLYPECTOMY;  Surgeon: Wilhelmenia Aloha Raddle., MD;  Location: [facility] ENDOSCOPY;  Service: Gastroenterology;;   POLYPECTOMY  05/13/2022   Procedure: POLYPECTOMY;  Surgeon: Wilhelmenia Aloha Raddle., MD;  Location: [facility] ENDOSCOPY;  Service: Gastroenterology;;  EGD and colon   POLYPECTOMY  10/20/2023   Procedure: POLYPECTOMY;  Surgeon: Maryruth Ole DASEN, MD;  Location: [facility] ENDOSCOPY;  Service: Endoscopy;;   SUBMUCOSAL LIFTING INJECTION  03/19/2021   Procedure: SUBMUCOSAL LIFTING INJECTION;  Surgeon: Wilhelmenia Aloha Raddle., MD;  Location: [facility] ENDOSCOPY;  Service: Gastroenterology;;   SUBMUCOSAL LIFTING INJECTION  05/13/2022   Procedure: SUBMUCOSAL LIFTING INJECTION;  Surgeon: Wilhelmenia Aloha Raddle., MD;  Location: [facility] ENDOSCOPY;  Service: Gastroenterology;;   SUBMUCOSAL TATTOO INJECTION  05/13/2022   Procedure: SUBMUCOSAL TATTOO INJECTION;  Surgeon: Wilhelmenia Aloha Raddle., MD;  Location: [facility] ENDOSCOPY;  Service: Gastroenterology;;   TONSILLECTOMY     UPPER ESOPHAGEAL ENDOSCOPIC ULTRASOUND (EUS) N/A 03/19/2021   Procedure: UPPER ESOPHAGEAL ENDOSCOPIC ULTRASOUND (EUS);  Surgeon: Wilhelmenia Aloha Raddle., MD;  Location: [facility] ENDOSCOPY;  Service: Gastroenterology;  Laterality:  N/A;    Home Medications:  Allergies as of 05/23/2024   No Known Allergies      Medication List        Accurate as of May 23, 2024  5:22 PM. If you have any questions, ask your nurse or doctor.          acetaminophen  650 MG CR tablet Commonly known as: TYLENOL  Take 1,300 mg by mouth See admin instructions. Take 1300 mg in the morning, may take a second 1300 mg dose during the day as needed for pain   albuterol  108 (90 Base) MCG/ACT inhaler Commonly known as: VENTOLIN  HFA Inhale 2 puffs into the lungs every 6 (six) hours as needed for wheezing or shortness of breath.   amLODipine  5 MG tablet Commonly known as: NORVASC  Take 1 tablet (5 mg total) by mouth daily.   apixaban  5 MG Tabs tablet Commonly known as: Eliquis  Take 1 tablet (5 mg total) by mouth 2 (two) times daily.   azelastine 0.1 % nasal spray Commonly known as: ASTELIN Place 1 spray into both nostrils as needed.   Breztri  Aerosphere 160-9-4.8 MCG/ACT Aero inhaler Generic drug: budesonide-glycopyrrolate -formoterol Inhale 2 puffs into the lungs 2 (two) times daily.   colchicine  0.6 MG tablet Take 0.6 mg by mouth 2 (two) times daily as needed (gout).   cyanocobalamin  1000 MCG/ML injection Commonly known as: VITAMIN B12   enalapril  10 MG tablet Commonly known as: VASOTEC  Take 1 tablet (10 mg total) by mouth daily.   finasteride  5 MG tablet Commonly known as: PROSCAR  Take 1 tablet (  5 mg total) by mouth daily.   Klor-Con  M20 20 MEQ tablet Generic drug: potassium chloride  SA Take by mouth.   meclizine  25 MG tablet Commonly known as: ANTIVERT  Take 25 mg by mouth every 6 (six) hours as needed.   metFORMIN 1000 MG tablet Commonly known as: GLUCOPHAGE Take 1,000 mg by mouth daily with breakfast.   metoprolol  succinate 25 MG 24 hr tablet Commonly known as: TOPROL -XL Take 1 tablet by mouth in the evening   omeprazole  20 MG capsule Commonly known as: PRILOSEC  Take 20 mg by mouth 2 (two) times  daily.   ondansetron  8 MG tablet Commonly known as: ZOFRAN  Take 8 mg by mouth 3 (three) times daily.   Potassium Chloride  ER 20 MEQ Tbcr Take 1 tablet by mouth daily.   tamsulosin  0.4 MG Caps capsule Commonly known as: FLOMAX  Take 1 capsule (0.4 mg total) by mouth daily.   tiZANidine  2 MG tablet Commonly known as: ZANAFLEX  Take 2 mg by mouth 3 (three) times daily.        Allergies:  No Known Allergies  Family History: Family History  Problem Relation Age of Onset   Hypertension Mother    Diabetes Mother    Osteoarthritis Mother    Osteoporosis Mother    Stroke Father    Cancer Father        unknown origin   Stomach cancer Maternal Grandmother    Kidney cancer Maternal Grandmother    Liver cancer Maternal Grandmother    Lung cancer Maternal Grandfather    Kidney disease Neg Hx    Prostate cancer Neg Hx    Bladder Cancer Neg Hx    Colon cancer Neg Hx    Esophageal cancer Neg Hx    Inflammatory bowel disease Neg Hx    Pancreatic cancer Neg Hx    Rectal cancer Neg Hx    Colon polyps Neg Hx     Social History:   reports that he quit smoking about 20 years ago. His smoking use included cigarettes. He started smoking about 34 years ago. His smokeless tobacco use includes chew. He reports that he does not currently use alcohol after a past usage of about 1.0 standard drink of alcohol per week. He reports that he does not currently use drugs after having used the following drugs: Marijuana.  Physical Exam: BP 125/71   Pulse 74   Ht 5' 10 (1.778 m)   Wt 160 lb (72.6 kg)   BMI 22.96 kg/m   Constitutional:  Alert and oriented, no acute distress, nontoxic appearing HEENT: [place], AT Cardiovascular: No clubbing, cyanosis, or edema Respiratory: Normal respiratory effort, no increased work of breathing Skin: No rashes, bruises or suspicious lesions Neurologic: Grossly intact, no focal deficits, moving all 4 extremities Psychiatric: Normal mood and affect  Laboratory  Data: Results for orders placed or performed in visit on 05/14/24  PSA   Collection Time: 05/14/24  1:52 PM  Result Value Ref Range   Prostate Specific Ag, Serum 1.8 0.0 - 4.0 ng/mL   Assessment & Plan:   1. BPH with obstruction/lower urinary tract symptoms (Primary) Tolerating Flomax  and finasteride  well.  His PSA has dropped since starting finasteride , but by less than half.  Regardless, it has remained WNL.  Will plan for repeat PSA in 6 months and anticipate seeing him back in 1 year with IPSS, PVR, and PSA.  May consider prostate MRI if his PSA fails to drop appropriately in the setting of finasteride  use. - PSA; Future -  tamsulosin  (FLOMAX ) 0.4 MG CAPS capsule; Take 1 capsule (0.4 mg total) by mouth daily.  Dispense: 90 capsule; Refill: 3 - finasteride  (PROSCAR ) 5 MG tablet; Take 1 tablet (5 mg total) by mouth daily.  Dispense: 90 tablet; Refill: 3   Return in about 6 months (around 11/23/2024) for Lab visit for PSA + 1 year IPSS/PVR/PSA.  Lucie Hones, PA-C  [facility] [place] [address] [place], [place] [address] [phone]

## 2024-07-12 ENCOUNTER — Other Ambulatory Visit: Payer: Self-pay | Admitting: Cardiology

## 2024-07-26 ENCOUNTER — Other Ambulatory Visit: Payer: Self-pay | Admitting: Cardiology

## 2024-07-26 NOTE — Telephone Encounter (Signed)
 Prescription refill request for Eliquis  received. Indication: afib  Last office visit: Budd Kindle 02/24/2024 Scr: 0.86, 01/02/2024 Age: 63 yo  Weight: 72.6 kg   Refill sent.

## 2024-08-07 ENCOUNTER — Encounter: Payer: Self-pay | Admitting: Pulmonary Disease

## 2024-08-07 ENCOUNTER — Ambulatory Visit: Admitting: Pulmonary Disease

## 2024-08-07 VITALS — BP 118/62 | HR 67 | Temp 97.6°F | Ht 70.0 in | Wt 163.2 lb

## 2024-08-07 DIAGNOSIS — R0602 Shortness of breath: Secondary | ICD-10-CM | POA: Diagnosis not present

## 2024-08-07 DIAGNOSIS — Z87891 Personal history of nicotine dependence: Secondary | ICD-10-CM

## 2024-08-07 DIAGNOSIS — J4489 Other specified chronic obstructive pulmonary disease: Secondary | ICD-10-CM

## 2024-08-07 DIAGNOSIS — J4540 Moderate persistent asthma, uncomplicated: Secondary | ICD-10-CM

## 2024-08-07 LAB — PULMONARY FUNCTION TEST
DL/VA % pred: 75 %
DL/VA: 3.12 ml/min/mmHg/L
DLCO unc % pred: 74 %
DLCO unc: 20.04 ml/min/mmHg
FEF 25-75 Post: 1.94 L/s
FEF 25-75 Pre: 1.29 L/s
FEF2575-%Change-Post: 50 %
FEF2575-%Pred-Post: 69 %
FEF2575-%Pred-Pre: 46 %
FEV1-%Change-Post: 11 %
FEV1-%Pred-Post: 86 %
FEV1-%Pred-Pre: 77 %
FEV1-Post: 3 L
FEV1-Pre: 2.69 L
FEV1FVC-%Change-Post: 3 %
FEV1FVC-%Pred-Pre: 84 %
FEV6-%Change-Post: 7 %
FEV6-%Pred-Post: 102 %
FEV6-%Pred-Pre: 94 %
FEV6-Post: 4.5 L
FEV6-Pre: 4.17 L
FEV6FVC-%Change-Post: 0 %
FEV6FVC-%Pred-Post: 103 %
FEV6FVC-%Pred-Pre: 103 %
FVC-%Change-Post: 7 %
FVC-%Pred-Post: 98 %
FVC-%Pred-Pre: 91 %
FVC-Post: 4.59 L
FVC-Pre: 4.25 L
Post FEV1/FVC ratio: 65 %
Post FEV6/FVC ratio: 98 %
Pre FEV1/FVC ratio: 63 %
Pre FEV6/FVC Ratio: 98 %
RV % pred: 125 %
RV: 2.91 L
TLC % pred: 114 %
TLC: 8.05 L

## 2024-08-07 MED ORDER — FLUTICASONE-SALMETEROL 250-50 MCG/ACT IN AEPB: 1.0000 | INHALATION_SPRAY | Freq: Two times a day (BID) | RESPIRATORY_TRACT | 6 refills | Status: AC

## 2024-08-07 NOTE — Progress Notes (Signed)
 Synopsis: Referred in by Kevin Domino, MD   Subjective:   PATIENT ID: Kevin Esparza GENDER: male DOB: 1960/12/17, MRN: [id number]  Chief Complaint  Patient presents with   Shortness of Breath    PFT results. DOE. No wheezing or cough.    HPI Kevin Esparza is a pleasant 63 year old male patient with a past medical history of heavy tobacco use, hypertension, type 2 diabetes mellitus, GERD presenting today to the pulmonary clinic for ongoing shortness of breath on exertion.  He reports that since November he got a URI along with nausea and vomiting for a period of time of exhaustion shortness of breath on exertion.  He does report intermittent cough worse during early spring when pollen is high.  Does report good appetite.  Takes albuterol  as needed with only minimal relief.  Family history -his grandfather had lung cancer but was a heavy smoker.  Social history -quit smoking in 2005, smoked 3 pack/day for 25 years.  Denies any alcohol use.  He worked as a [employer] but currently on disability.  He does not have 1 dog at home and chickens in the backyard.  OV 10.07.2025 Kevin Esparza is here to follow up on his PFT results. These show obstruction with significant response to bronchodilator. Gas trapping and mildly reduced DLCO. Overall consistent with COPD and asthma overlap. He was not able to get Trelegy as it was expensive but when used the samples he felt a significant difference. CT chest wo contrast reviewed and did not show any concerning findings.   ROS All systems were reviewed and are negative except for the above.  Objective:   Vitals:   08/07/24 1339  BP: 118/62  Pulse: 67  Temp: 97.6 F (36.4 C)  SpO2: 99%  Weight: 163 lb 3.2 oz (74 kg)  Height: 5' 10 (1.778 m)   99% on RA BMI Readings from Last 3 Encounters:  08/07/24 23.42 kg/m  08/07/24 23.42 kg/m  05/23/24 22.96 kg/m   Wt Readings from Last 3 Encounters:  08/07/24 163 lb 3.2 oz (74 kg)   08/07/24 163 lb 3.2 oz (74 kg)  05/23/24 160 lb (72.6 kg)    Physical Exam GEN: NAD, Healthy Appearing HEENT: Supple Neck, Reactive Pupils, EOMI  CVS: Normal S1, Normal S2, RRR, No murmurs or ES appreciated  Lungs: poor air movement bilaterally.  Abdomen: Soft, non tender, non distended, + BS  Extremities: Warm and well perfused, No edema  Skin: No suspicious lesions appreciated  Psych: Normal Affect  Ancillary Information   CBC    Component Value Date/Time   WBC 6.5 01/02/2024 0948   RBC 5.38 01/02/2024 0948   HGB 14.8 01/02/2024 0948   HGB 14.7 04/20/2014 1206   HCT 43.6 01/02/2024 0948   HCT 43.8 04/20/2014 1206   PLT 225.0 01/02/2024 0948   PLT 142 (L) 04/20/2014 1206   MCV 81.1 01/02/2024 0948   MCV 87 04/20/2014 1206   MCH 28.9 10/21/2023 0433   MCHC 33.8 01/02/2024 0948   RDW 14.6 01/02/2024 0948   RDW 13.6 04/20/2014 1206   LYMPHSABS 1.5 01/02/2024 0948   LYMPHSABS 1.8 04/20/2014 1206   MONOABS 0.9 01/02/2024 0948   MONOABS 0.6 04/20/2014 1206   EOSABS 0.1 01/02/2024 0948   EOSABS 0.1 04/20/2014 1206   BASOSABS 0.0 01/02/2024 0948   BASOSABS 0.0 04/20/2014 1206   Labs and imaging were reviewed.     Latest Ref Rng & Units 08/07/2024   12:44 PM  PFT Results  FVC-Pre L 4.25  P  FVC-Predicted Pre % 91  P  FVC-Post L 4.59  P  FVC-Predicted Post % 98  P  Pre FEV1/FVC % % 63  P  Post FEV1/FCV % % 65  P  FEV1-Pre L 2.69  P  FEV1-Predicted Pre % 77  P  FEV1-Post L 3.00  P  DLCO uncorrected ml/min/mmHg 20.04  P  DLCO UNC% % 74  P  DLVA Predicted % 75  P  TLC L 8.05  P  TLC % Predicted % 114  P  RV % Predicted % 125  P    P Preliminary result     Assessment & Plan:  Kevin Esparza is a pleasant 63 year old male patient with a past medical history of heavy tobacco use, hypertension, type 2 diabetes mellitus, GERD presenting today to the pulmonary clinic for ongoing shortness of breath on exertion.  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   []  Start  Advair Diskus 250-50 1 puff BID. Advised mouth rinsing with each use.  []  C/w Albuterol  as needed.   #Hx of tobacco use  quit smoking in 2005, smoked 3 pack/day for 25 years.  []  Enroll in LDCT scan program.   RTC 3 months   I spent 40 minutes caring for this patient today, including preparing to see the patient, obtaining a medical history , reviewing a separately obtained history, performing a medically appropriate examination and/or evaluation, counseling and educating the patient/family/caregiver, ordering medications, tests, or procedures, documenting clinical information in the electronic health record, and independently interpreting results (not separately reported/billed) and communicating results to the patient/family/caregiver  Darrin Barn, MD [facility] 08/07/2024 1:41 PM

## 2024-08-07 NOTE — Patient Instructions (Signed)
 Full PFT completed today ? ?

## 2024-08-07 NOTE — Progress Notes (Signed)
 Full PFT completed today ? ?

## 2024-10-02 ENCOUNTER — Ambulatory Visit: Attending: Medical | Admitting: Medical

## 2024-10-02 ENCOUNTER — Encounter: Payer: Self-pay | Admitting: Medical

## 2024-10-02 VITALS — BP 132/70 | HR 60 | Ht 70.0 in | Wt 167.0 lb

## 2024-10-02 DIAGNOSIS — I10 Essential (primary) hypertension: Secondary | ICD-10-CM | POA: Diagnosis not present

## 2024-10-02 DIAGNOSIS — I480 Paroxysmal atrial fibrillation: Secondary | ICD-10-CM

## 2024-10-02 DIAGNOSIS — E782 Mixed hyperlipidemia: Secondary | ICD-10-CM

## 2024-10-02 DIAGNOSIS — J449 Chronic obstructive pulmonary disease, unspecified: Secondary | ICD-10-CM | POA: Diagnosis not present

## 2024-10-02 NOTE — Progress Notes (Signed)
 Cardiology Office Note   Date:  10/02/2024  ID:  Kevin Esparza, Kevin Esparza 1961-04-18, MRN [id number] PCP: Donnie Handing, PA  [facility] Providers Cardiologist:  Redell Cave, MD Electrophysiologist:  OLE ONEIDA HOLTS, MD    History of Present Illness Kevin Esparza is a 63 y.o. male with a h/o Afib, stroke, HTN, diabetes, GERD, former smoker (quit 20 years ago) who presents for 6 month follow-up of Afib.   Echo in 03/2022 showed EF of 60 to 65%, small pericardial effusion.  Myoview  Lexiscan  01/2022 showed no evidence of ischemia, low risk study.  Limited echo in August 2023 showed EF 55 to 60% no pericardial effusion.  Patient was last seen 02/24/2024 for preop evaluation of colonoscopy. No further work-up was required.   Today, the patient is overall doing well. He denies chest pain. He has chronic SOB that is stable. He reports occasional palpitations that he feels is from varying sugar levels-it is different from Afib. He denies bleeding issues.   Studies Reviewed EKG Interpretation Date/Time:  Tuesday October 02 2024 08:24:15 EST Ventricular Rate:  60 PR Interval:  132 QRS Duration:  84 QT Interval:  394 QTC Calculation: 394 R Axis:   25  Text Interpretation: Normal sinus rhythm Normal ECG When compared with ECG of 24-Feb-2024 11:53, No significant change was found Confirmed by Franchester, Masaki Rothbauer ([id number]) on 10/02/2024 8:28:19 AM      Echo 01/2024 1. Left ventricular ejection fraction, by estimation, is 55 to 60%. Left  ventricular ejection fraction by PLAX is 57 %. The left ventricle has  normal function. The left ventricle has no regional wall motion  abnormalities. Left ventricular diastolic  parameters were normal. The average left ventricular global longitudinal  strain is -17.6 %. The global longitudinal strain is normal.   2. Right ventricular systolic function is normal. The right ventricular  size is normal. Tricuspid regurgitation signal is  inadequate for assessing  PA pressure.   3. The mitral valve is normal in structure. Trivial mitral valve  regurgitation. No evidence of mitral stenosis.   4. Tricuspid valve regurgitation is mild to moderate.   5. The aortic valve is tricuspid. Aortic valve regurgitation is mild. No  aortic stenosis is present.   6. The inferior vena cava is normal in size with greater than 50%  respiratory variability, suggesting right atrial pressure of 3 mmHg.   Echo complete 07/2022 1. Left ventricular ejection fraction, by estimation, is 60 to 65%. The  left ventricle has normal function. The left ventricle has no regional  wall motion abnormalities. Left ventricular diastolic parameters were  normal.   2. Right ventricular systolic function is normal. The right ventricular  size is normal. Tricuspid regurgitation signal is inadequate for assessing  PA pressure.   3. The mitral valve is normal in structure. Mild mitral valve  regurgitation. No evidence of mitral stenosis.   4. Tricuspid valve regurgitation is mild to moderate.   5. The aortic valve is tricuspid. There is mild thickening of the aortic  valve. Aortic valve regurgitation is mild. Aortic valve sclerosis is  present, with no evidence of aortic valve stenosis.   6. The inferior vena cava is normal in size with greater than 50%  respiratory variability, suggesting right atrial pressure of 3 mmHg.   Limited echo 06/2022 1. Left ventricular ejection fraction, by estimation, is 55 to 60%. The  left ventricle has normal function. The left ventricle has no regional  wall motion abnormalities.   2.  Right ventricular systolic function is normal. The right ventricular  size is normal. Tricuspid regurgitation signal is inadequate for assessing  PA pressure.   3. There is no evidence of pericardial effusion.   4. The mitral valve is normal in structure. Mild mitral valve  regurgitation.   5. The aortic valve is normal in structure. Aortic  valve regurgitation is  mild.   6. The inferior vena cava is normal in size with greater than 50%  respiratory variability, suggesting right atrial pressure of 3 mmHg.   Echo 03/2022 1. Left ventricular ejection fraction, by estimation, is 60 to 65%. The  left ventricle has normal function. The left ventricle has no regional  wall motion abnormalities. Left ventricular diastolic parameters are  consistent with Grade I diastolic  dysfunction (impaired relaxation).   2. Right ventricular systolic function is normal. The right ventricular  size is normal. There is normal pulmonary artery systolic pressure. The  estimated right ventricular systolic pressure is 13.6 mmHg.   3. A small pericardial effusion is present. Estimated 0.8 cm or less  circumferential. There is no evidence of cardiac tamponade.   4. The mitral valve is normal in structure. No evidence of mitral valve  regurgitation. No evidence of mitral stenosis.   5. The aortic valve is tricuspid. Aortic valve regurgitation is not  visualized. No aortic stenosis is present.   6. The inferior vena cava is normal in size with greater than 50%  respiratory variability, suggesting right atrial pressure of 3 mmHg.      Physical Exam VS:  BP 132/70   Pulse 60   Ht 5' 10 (1.778 m)   Wt 167 lb (75.8 kg)   SpO2 98%   BMI 23.96 kg/m        Wt Readings from Last 3 Encounters:  10/02/24 167 lb (75.8 kg)  08/07/24 163 lb 3.2 oz (74 kg)  08/07/24 163 lb 3.2 oz (74 kg)    GEN: Well nourished, well developed in no acute distress NECK: No JVD; No carotid bruits CARDIAC: RRR, no murmurs, rubs, gallops RESPIRATORY:  Clear to auscultation without rales, wheezing or rhonchi  ABDOMEN: Soft, non-tender, non-distended EXTREMITIES:  No edema; No deformity   ASSESSMENT AND PLAN  Paroxysmal Afib The patient is in normal sinus rhythm on EKG.  Patient has occasional palpitations that he feels is from abnormal blood sugar levels.  He has rare  A-fib episodes.  Continue Eliquis  5 mg twice daily for stroke prophylaxis.  Continue Toprol  25 mg daily for rate control.  Patient denies any bleeding issues.  HTN Blood pressure good.  Continue amlodipine  5 mg daily, enalapril  10 mg daily and Toprol  25 mg daily.  HLD LDL 103.  Patient says he did not tolerate statins in the past due to fatigue.  COPD Chronic SOB Shortness of breath is unchanged.  He follows with pulmonology regularly.       Dispo: Follow-up in 1 year  Signed, Armand Preast VEAR Fishman, PA-C

## 2024-10-02 NOTE — Patient Instructions (Signed)
 Medication Instructions:  Your physician recommends that you continue on your current medications as directed. Please refer to the Current Medication list given to you today.    *If you need a refill on your cardiac medications before your next appointment, please call your pharmacy*  Lab Work: No labs ordered today    Testing/Procedures: No test ordered today   Follow-Up: At [facility], you and your health needs are our priority.  As part of our continuing mission to provide you with exceptional heart care, our providers are all part of one team.  This team includes your primary Cardiologist (physician) and Advanced Practice Providers or APPs (Physician Assistants and Nurse Practitioners) who all work together to provide you with the care you need, when you need it.  Your next appointment:   1 year(s)  Provider:   Redell Cave, MD or Cadence Franchester, PA-C

## 2024-11-23 ENCOUNTER — Other Ambulatory Visit

## 2024-11-23 DIAGNOSIS — N138 Other obstructive and reflux uropathy: Secondary | ICD-10-CM

## 2024-11-28 ENCOUNTER — Ambulatory Visit: Payer: Self-pay | Admitting: Physician Assistant

## 2024-11-28 LAB — PSA: Prostate Specific Ag, Serum: 3.1 ng/mL (ref 0.0–4.0)

## 2025-05-23 ENCOUNTER — Ambulatory Visit: Admitting: Physician Assistant
